# Patient Record
Sex: Female | Born: 1965
Health system: Southern US, Community
[De-identification: ages and names within clinical notes are randomized; demographics above are authoritative.]

## PROBLEM LIST (undated history)

## (undated) DIAGNOSIS — M2550 Pain in unspecified joint: Secondary | ICD-10-CM

## (undated) DIAGNOSIS — F429 Obsessive-compulsive disorder, unspecified: Secondary | ICD-10-CM

## (undated) DIAGNOSIS — T8859XA Other complications of anesthesia, initial encounter: Secondary | ICD-10-CM

## (undated) DIAGNOSIS — M25569 Pain in unspecified knee: Secondary | ICD-10-CM

## (undated) DIAGNOSIS — K760 Fatty (change of) liver, not elsewhere classified: Secondary | ICD-10-CM

## (undated) DIAGNOSIS — R112 Nausea with vomiting, unspecified: Secondary | ICD-10-CM

## (undated) DIAGNOSIS — K5900 Constipation, unspecified: Secondary | ICD-10-CM

## (undated) DIAGNOSIS — T4145XA Adverse effect of unspecified anesthetic, initial encounter: Secondary | ICD-10-CM

## (undated) DIAGNOSIS — G4730 Sleep apnea, unspecified: Secondary | ICD-10-CM

## (undated) DIAGNOSIS — E669 Obesity, unspecified: Secondary | ICD-10-CM

## (undated) DIAGNOSIS — F419 Anxiety disorder, unspecified: Secondary | ICD-10-CM

## (undated) DIAGNOSIS — D649 Anemia, unspecified: Secondary | ICD-10-CM

## (undated) DIAGNOSIS — Z9889 Other specified postprocedural states: Secondary | ICD-10-CM

## (undated) DIAGNOSIS — E119 Type 2 diabetes mellitus without complications: Secondary | ICD-10-CM

## (undated) DIAGNOSIS — M1990 Unspecified osteoarthritis, unspecified site: Secondary | ICD-10-CM

## (undated) DIAGNOSIS — K589 Irritable bowel syndrome without diarrhea: Secondary | ICD-10-CM

## (undated) DIAGNOSIS — K5790 Diverticulosis of intestine, part unspecified, without perforation or abscess without bleeding: Secondary | ICD-10-CM

## (undated) DIAGNOSIS — M549 Dorsalgia, unspecified: Secondary | ICD-10-CM

## (undated) DIAGNOSIS — R0602 Shortness of breath: Secondary | ICD-10-CM

## (undated) DIAGNOSIS — K219 Gastro-esophageal reflux disease without esophagitis: Secondary | ICD-10-CM

## (undated) HISTORY — DX: Pain in unspecified knee: M25.569

## (undated) HISTORY — DX: Obesity, unspecified: E66.9

## (undated) HISTORY — DX: Obsessive-compulsive disorder, unspecified: F42.9

## (undated) HISTORY — PX: KNEE ARTHROSCOPY W/ ACL RECONSTRUCTION: SHX1858

## (undated) HISTORY — DX: Fatty (change of) liver, not elsewhere classified: K76.0

## (undated) HISTORY — DX: Pain in unspecified joint: M25.50

## (undated) HISTORY — DX: Shortness of breath: R06.02

## (undated) HISTORY — PX: BREAST REDUCTION SURGERY: SHX8

## (undated) HISTORY — DX: Constipation, unspecified: K59.00

## (undated) HISTORY — PX: ANAL FISSURE REPAIR: SHX2312

## (undated) HISTORY — DX: Irritable bowel syndrome, unspecified: K58.9

## (undated) HISTORY — DX: Dorsalgia, unspecified: M54.9

## (undated) HISTORY — PX: TONSILLECTOMY AND ADENOIDECTOMY: SHX28

---
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
## 2005-04-23 ENCOUNTER — Other Ambulatory Visit: Admission: RE | Admit: 2005-04-23 | Discharge: 2005-04-23 | Payer: Self-pay | Admitting: Obstetrics and Gynecology

## 2005-07-12 ENCOUNTER — Ambulatory Visit (HOSPITAL_COMMUNITY): Admission: RE | Admit: 2005-07-12 | Discharge: 2005-07-13 | Payer: Self-pay | Admitting: Surgery

## 2008-02-10 ENCOUNTER — Encounter: Admission: RE | Admit: 2008-02-10 | Discharge: 2008-02-10 | Payer: Self-pay | Admitting: Family Medicine

## 2009-02-10 ENCOUNTER — Encounter: Admission: RE | Admit: 2009-02-10 | Discharge: 2009-02-10 | Payer: Self-pay | Admitting: Family Medicine

## 2009-04-29 ENCOUNTER — Emergency Department (HOSPITAL_COMMUNITY): Admission: EM | Admit: 2009-04-29 | Discharge: 2009-04-29 | Payer: Self-pay | Admitting: Emergency Medicine

## 2010-02-12 ENCOUNTER — Encounter: Admission: RE | Admit: 2010-02-12 | Discharge: 2010-02-12 | Payer: Self-pay | Admitting: Family Medicine

## 2010-04-14 ENCOUNTER — Emergency Department (HOSPITAL_COMMUNITY)
Admission: EM | Admit: 2010-04-14 | Discharge: 2010-04-14 | Payer: Self-pay | Source: Home / Self Care | Admitting: Emergency Medicine

## 2010-08-13 LAB — POCT I-STAT, CHEM 8
BUN: 13 mg/dL (ref 6–23)
Calcium, Ion: 1.1 mmol/L — ABNORMAL LOW (ref 1.12–1.32)
Creatinine, Ser: 0.5 mg/dL (ref 0.4–1.2)
TCO2: 25 mmol/L (ref 0–100)

## 2010-08-13 LAB — DIFFERENTIAL
Eosinophils Relative: 1 % (ref 0–5)
Lymphocytes Relative: 22 % (ref 12–46)
Lymphs Abs: 1.8 10*3/uL (ref 0.7–4.0)
Neutro Abs: 5.4 10*3/uL (ref 1.7–7.7)

## 2010-08-13 LAB — URINALYSIS, ROUTINE W REFLEX MICROSCOPIC
Nitrite: NEGATIVE
Specific Gravity, Urine: 1.03 (ref 1.005–1.030)
Urobilinogen, UA: 1 mg/dL (ref 0.0–1.0)

## 2010-08-13 LAB — COMPREHENSIVE METABOLIC PANEL
AST: 18 U/L (ref 0–37)
CO2: 22 mEq/L (ref 19–32)
Calcium: 8.8 mg/dL (ref 8.4–10.5)
Creatinine, Ser: 0.6 mg/dL (ref 0.4–1.2)
GFR calc Af Amer: >60 mL/min (ref >60)
GFR calc non Af Amer: >60 mL/min (ref >60)

## 2010-08-13 LAB — CBC
MCHC: 33.5 g/dL (ref 30.0–36.0)
MCV: 78.7 fL (ref 78.0–100.0)
Platelets: 251 10*3/uL (ref 150–400)
RBC: 5.02 MIL/uL (ref 3.87–5.11)

## 2010-08-13 LAB — URINE MICROSCOPIC-ADD ON

## 2010-08-13 LAB — PREGNANCY, URINE: Preg Test, Ur: NEGATIVE

## 2010-09-28 NOTE — Op Note (Signed)
NAMESHUNTAVIA, YERBY                  ACCOUNT NO.:  [id number]   MEDICAL RECORD NO.:  [id number]          PATIENT TYPE:  OIB   LOCATION:  1305                         FACILITY:  [facility]   PHYSICIAN:  Thomas A. Cornett, M.D.DATE OF BIRTH:  [birth year]   DATE OF PROCEDURE:  07/12/2005  DATE OF DISCHARGE:                                 OPERATIVE REPORT   PREOPERATIVE DIAGNOSIS:  Nonhealing posterior midline anal fissure.   POSTOPERATIVE DIAGNOSIS:  Nonhealing posterior midline anal fissure.   PROCEDURE:  1.  Right lateral internal sphincterotomy.  2.  Rigid proctoscopy.   SURGEON:  Dr. Harriette Bouillon.   ANESTHESIA:  General endotracheal anesthesia with 10 mL of 0.25% Marcaine  with epinephrine.   ESTIMATED BLOOD LOSS:  40 mL.   SPECIMENS:  None.   FINDINGS:  Large posterior midline nonhealing anal fissure with exposed  internal sphincter.   INDICATIONS FOR PROCEDURE:  The patient is a 45 year old female who has seen  Dr. Colin Benton in the past for a nonhealing anal fissure. She was managed  medically but failed and came back to see me subsequently for further  evaluation and consideration for a lateral internal sphincterotomy. After  examining her, it was significant that she a large fissure with exposed  sphincter and at this point in time she had failed diltiazem therapy. I  recommend a lateral internal sphincterotomy after discussion of the  procedure with the patient and the potential long-term consequences and  risks. She agreed to proceed.   DESCRIPTION OF PROCEDURE:  The patient was brought to the operating room and  placed supine. After induction of general endotracheal anesthesia, she was  placed in stirrups in lithotomy position. Her perineum was prepped and  draped in a sterile fashion and tape was used to pull her buttocks apart.  Digital rectal examination was done initially. Her tone was normal. A rigid  proctoscope was then passed to 10 cm and her distal rectum was  examined with  mild insufflation. I saw no other abnormality except for some grade II to  grade III internal hemorrhoids in all three columns and a very large  nonhealing posterior anal fissure. The scope was withdrawn and the air was  released. Next a smaller proctoscope was placed and the right lateral  position was palpated. I could feel the intersphincteric groove between the  internal and external sphincter. An incision was made in the right lateral  position from just below the dentate line down to the anoderm. I used  cautery to separate the skin with a hemostat. She had significant internal  hemorrhoidal disease as well. Next, I was able to clear off the internal  sphincter and identify it. I was able to palpate the intersphincteric groove  and the sphincter was hypertrophied. A hemostat was placed in the  intersphincteric groove under direct vision thus exposing the internal  sphincter. Cautery was used to divide the fibers of the internal sphincter  for approximately 2 cm right up to the dentate line. Hemostasis was achieved  with cautery and I placed a few small Monocryl 4-0 sutures  for hemostasis  over the mucosa at this point. The end of the incision was left open to  drain. Next I reexamined the posterior anal fissure. A couple sutures of  Monocryl were used to cover part of the sphincter that was exposed with  mucosa. This was also helpful for some of the oozing from the area due to  the manipulation from the proctoscope. Gelfoam was then placed at this point  in time. All counts of sponge, needle and instruments were found to be  correct at this portion of the case. The patient was awoke and taken to  recovery in satisfactory condition.      Thomas A. Cornett, M.D.  Electronically Signed     TAC/MEDQ  D:  07/12/2005  T:  07/13/2005  Job:  [id number]   cc:   Carrington Clamp, M.D.  Fax: [phone]

## 2011-01-10 ENCOUNTER — Other Ambulatory Visit: Payer: Self-pay | Admitting: Family Medicine

## 2011-01-10 DIAGNOSIS — Z1231 Encounter for screening mammogram for malignant neoplasm of breast: Secondary | ICD-10-CM

## 2011-02-28 ENCOUNTER — Ambulatory Visit
Admission: RE | Admit: 2011-02-28 | Discharge: 2011-02-28 | Disposition: A | Discharge status: Home / Self Care | Payer: BC Managed Care – PPO | Source: Ambulatory Visit | Attending: Family Medicine | Admitting: Family Medicine

## 2011-02-28 DIAGNOSIS — Z1231 Encounter for screening mammogram for malignant neoplasm of breast: Secondary | ICD-10-CM

## 2012-01-30 ENCOUNTER — Other Ambulatory Visit: Payer: Self-pay | Admitting: Family Medicine

## 2012-01-30 DIAGNOSIS — Z1231 Encounter for screening mammogram for malignant neoplasm of breast: Secondary | ICD-10-CM

## 2012-03-03 ENCOUNTER — Ambulatory Visit: Payer: BC Managed Care – PPO

## 2012-03-11 ENCOUNTER — Ambulatory Visit
Admission: RE | Admit: 2012-03-11 | Discharge: 2012-03-11 | Disposition: A | Discharge status: Home / Self Care | Payer: BC Managed Care – PPO | Source: Ambulatory Visit | Attending: Family Medicine | Admitting: Family Medicine

## 2012-03-11 DIAGNOSIS — Z1231 Encounter for screening mammogram for malignant neoplasm of breast: Secondary | ICD-10-CM

## 2013-02-04 ENCOUNTER — Other Ambulatory Visit: Payer: Self-pay

## 2013-02-04 DIAGNOSIS — Z1231 Encounter for screening mammogram for malignant neoplasm of breast: Secondary | ICD-10-CM

## 2013-03-12 ENCOUNTER — Ambulatory Visit
Admission: RE | Admit: 2013-03-12 | Discharge: 2013-03-12 | Disposition: A | Discharge status: Home / Self Care | Payer: BC Managed Care – PPO | Source: Ambulatory Visit

## 2013-03-12 DIAGNOSIS — Z1231 Encounter for screening mammogram for malignant neoplasm of breast: Secondary | ICD-10-CM

## 2014-02-07 ENCOUNTER — Other Ambulatory Visit: Payer: Self-pay

## 2014-02-07 DIAGNOSIS — Z1231 Encounter for screening mammogram for malignant neoplasm of breast: Secondary | ICD-10-CM

## 2014-03-14 ENCOUNTER — Ambulatory Visit
Admission: RE | Admit: 2014-03-14 | Discharge: 2014-03-14 | Disposition: A | Discharge status: Home / Self Care | Payer: BC Managed Care – PPO | Source: Ambulatory Visit

## 2014-03-14 DIAGNOSIS — Z1231 Encounter for screening mammogram for malignant neoplasm of breast: Secondary | ICD-10-CM

## 2014-10-06 ENCOUNTER — Other Ambulatory Visit: Payer: Self-pay | Admitting: Gastroenterology

## 2014-11-08 ENCOUNTER — Encounter (HOSPITAL_COMMUNITY): Payer: Self-pay | Admitting: *Deleted

## 2014-11-15 ENCOUNTER — Ambulatory Visit (HOSPITAL_COMMUNITY): Payer: BLUE CROSS/BLUE SHIELD | Admitting: Anesthesiology

## 2014-11-16 NOTE — Anesthesia Preprocedure Evaluation (Addendum)
Anesthesia Evaluation    Reviewed: Allergy & Precautions, NPO status , Patient's Chart, lab work & pertinent test results, Unable to perform ROS - Chart review only  History of Anesthesia Complications (+) PONV and history of anesthetic complications (low BP with spinal anesthesia)  Airway        Dental   Pulmonary sleep apnea and Continuous Positive Airway Pressure Ventilation ,          Cardiovascular negative cardio ROS      Neuro/Psych Anxiety    GI/Hepatic GERD-  Medicated,  Endo/Other  diabetes, Type 2, Oral Hypoglycemic Agents  Renal/GU      Musculoskeletal   Abdominal   Peds  Hematology  (+) anemia ,   Anesthesia Other Findings   Reproductive/Obstetrics                           Anesthesia Physical Anesthesia Plan  ASA: III  Anesthesia Plan: MAC   Post-op Pain Management:    Induction:   Airway Management Planned: Natural Airway  Additional Equipment:   Intra-op Plan:   Post-operative Plan:   Informed Consent: I have reviewed the patients History and Physical, chart, labs and discussed the procedure including the risks, benefits and alternatives for the proposed anesthesia with the patient or authorized representative who has indicated his/her understanding and acceptance.   Dental advisory given  Plan Discussed with: CRNA  Anesthesia Plan Comments: (Case canceled due to inadequate prep)      Anesthesia Quick Evaluation

## 2014-11-17 ENCOUNTER — Encounter (HOSPITAL_COMMUNITY)
Admission: RE | Disposition: A | Discharge status: Home / Self Care | Payer: Self-pay | Source: Ambulatory Visit | Attending: Gastroenterology

## 2014-11-17 ENCOUNTER — Encounter (HOSPITAL_COMMUNITY): Payer: Self-pay | Admitting: Certified Registered"

## 2014-11-17 ENCOUNTER — Ambulatory Visit (HOSPITAL_COMMUNITY)
Admission: RE | Admit: 2014-11-17 | Discharge: 2014-11-17 | Disposition: A | Discharge status: Home / Self Care | Payer: BLUE CROSS/BLUE SHIELD | Source: Ambulatory Visit | Attending: Gastroenterology | Admitting: Gastroenterology

## 2014-11-17 DIAGNOSIS — G4730 Sleep apnea, unspecified: Secondary | ICD-10-CM | POA: Diagnosis not present

## 2014-11-17 DIAGNOSIS — E119 Type 2 diabetes mellitus without complications: Secondary | ICD-10-CM | POA: Insufficient documentation

## 2014-11-17 DIAGNOSIS — K219 Gastro-esophageal reflux disease without esophagitis: Secondary | ICD-10-CM | POA: Diagnosis not present

## 2014-11-17 DIAGNOSIS — F419 Anxiety disorder, unspecified: Secondary | ICD-10-CM | POA: Insufficient documentation

## 2014-11-17 DIAGNOSIS — Z79899 Other long term (current) drug therapy: Secondary | ICD-10-CM | POA: Diagnosis not present

## 2014-11-17 DIAGNOSIS — Z538 Procedure and treatment not carried out for other reasons: Secondary | ICD-10-CM | POA: Diagnosis not present

## 2014-11-17 DIAGNOSIS — R1031 Right lower quadrant pain: Secondary | ICD-10-CM | POA: Diagnosis not present

## 2014-11-17 DIAGNOSIS — D649 Anemia, unspecified: Secondary | ICD-10-CM | POA: Insufficient documentation

## 2014-11-17 HISTORY — DX: Other specified postprocedural states: Z98.890

## 2014-11-17 HISTORY — DX: Anxiety disorder, unspecified: F41.9

## 2014-11-17 HISTORY — DX: Nausea with vomiting, unspecified: R11.2

## 2014-11-17 HISTORY — DX: Gastro-esophageal reflux disease without esophagitis: K21.9

## 2014-11-17 HISTORY — DX: Anemia, unspecified: D64.9

## 2014-11-17 HISTORY — DX: Adverse effect of unspecified anesthetic, initial encounter: T41.45XA

## 2014-11-17 HISTORY — DX: Other complications of anesthesia, initial encounter: T88.59XA

## 2014-11-17 HISTORY — DX: Type 2 diabetes mellitus without complications: E11.9

## 2014-11-17 HISTORY — DX: Unspecified osteoarthritis, unspecified site: M19.90

## 2014-11-17 HISTORY — DX: Sleep apnea, unspecified: G47.30

## 2014-11-17 SURGERY — CANCELLED PROCEDURE
Anesthesia: Monitor Anesthesia Care

## 2014-11-17 MED ORDER — PROPOFOL 10 MG/ML IV BOLUS
INTRAVENOUS | Status: AC
  Filled 2014-11-17: qty 20

## 2014-11-17 MED ORDER — SODIUM CHLORIDE 0.9 % IV SOLN: INTRAVENOUS | Status: DC

## 2014-11-17 MED ORDER — ONDANSETRON HCL 4 MG/2ML IJ SOLN: 4.0000 mg | Freq: Once | INTRAMUSCULAR | Status: AC | PRN

## 2014-11-17 MED ORDER — LIDOCAINE HCL (CARDIAC) 20 MG/ML IV SOLN
INTRAVENOUS | Status: AC
  Filled 2014-11-17: qty 5

## 2014-12-09 ENCOUNTER — Encounter (HOSPITAL_COMMUNITY): Payer: Self-pay

## 2014-12-09 NOTE — OR Nursing (Signed)
Pt. Unable to tolerate prep.  Dr. Collene Mares notified and patient was discharged with instruction to reschedle procedure.    Laverta Baltimore, RN

## 2015-01-23 ENCOUNTER — Encounter (HOSPITAL_COMMUNITY): Payer: Self-pay | Admitting: *Deleted

## 2015-02-16 ENCOUNTER — Encounter (HOSPITAL_COMMUNITY): Payer: Self-pay | Admitting: *Deleted

## 2015-02-20 ENCOUNTER — Other Ambulatory Visit: Payer: Self-pay

## 2015-02-20 DIAGNOSIS — Z1231 Encounter for screening mammogram for malignant neoplasm of breast: Secondary | ICD-10-CM

## 2015-02-22 ENCOUNTER — Encounter (HOSPITAL_COMMUNITY): Payer: Self-pay | Admitting: Anesthesiology

## 2015-02-22 ENCOUNTER — Other Ambulatory Visit: Payer: Self-pay | Admitting: Gastroenterology

## 2015-02-22 NOTE — Anesthesia Preprocedure Evaluation (Signed)
Anesthesia Evaluation  Patient identified by MRN, date of birth, ID band Patient awake    Reviewed: Allergy & Precautions, NPO status , Patient's Chart, lab work & pertinent test results  History of Anesthesia Complications (+) PONV and history of anesthetic complications  Airway Mallampati: II  TM Distance: >3 FB Neck ROM: Full    Dental no notable dental hx.    Pulmonary sleep apnea and Continuous Positive Airway Pressure Ventilation ,    Pulmonary exam normal breath sounds clear to auscultation       Cardiovascular negative cardio ROS Normal cardiovascular exam Rhythm:Regular Rate:Normal     Neuro/Psych Anxiety negative neurological ROS     GI/Hepatic Neg liver ROS, GERD  Medicated,  Endo/Other  diabetes, Type 2, Oral Hypoglycemic AgentsMorbid obesity  Renal/GU negative Renal ROS  negative genitourinary   Musculoskeletal  (+) Arthritis ,   Abdominal (+) + obese,   Peds negative pediatric ROS (+)  Hematology  (+) anemia ,   Anesthesia Other Findings   Reproductive/Obstetrics negative OB ROS                             Anesthesia Physical Anesthesia Plan  ASA: III  Anesthesia Plan: MAC   Post-op Pain Management:    Induction: Intravenous  Airway Management Planned: Natural Airway  Additional Equipment:   Intra-op Plan:   Post-operative Plan:   Informed Consent: I have reviewed the patients History and Physical, chart, labs and discussed the procedure including the risks, benefits and alternatives for the proposed anesthesia with the patient or authorized representative who has indicated his/her understanding and acceptance.   Dental advisory given  Plan Discussed with: CRNA  Anesthesia Plan Comments:         Anesthesia Quick Evaluation

## 2015-02-23 ENCOUNTER — Ambulatory Visit (HOSPITAL_COMMUNITY)
Admission: RE | Admit: 2015-02-23 | Discharge: 2015-02-23 | Disposition: A | Discharge status: Home / Self Care | Payer: BLUE CROSS/BLUE SHIELD | Source: Ambulatory Visit | Attending: Gastroenterology | Admitting: Gastroenterology

## 2015-02-23 ENCOUNTER — Ambulatory Visit (HOSPITAL_COMMUNITY): Payer: BLUE CROSS/BLUE SHIELD | Admitting: Anesthesiology

## 2015-02-23 ENCOUNTER — Encounter (HOSPITAL_COMMUNITY): Payer: Self-pay | Admitting: *Deleted

## 2015-02-23 ENCOUNTER — Encounter (HOSPITAL_COMMUNITY)
Admission: RE | Disposition: A | Discharge status: Home / Self Care | Payer: Self-pay | Source: Ambulatory Visit | Attending: Gastroenterology

## 2015-02-23 DIAGNOSIS — K621 Rectal polyp: Secondary | ICD-10-CM | POA: Diagnosis not present

## 2015-02-23 DIAGNOSIS — E119 Type 2 diabetes mellitus without complications: Secondary | ICD-10-CM | POA: Diagnosis not present

## 2015-02-23 DIAGNOSIS — Z791 Long term (current) use of non-steroidal anti-inflammatories (NSAID): Secondary | ICD-10-CM | POA: Diagnosis not present

## 2015-02-23 DIAGNOSIS — M1990 Unspecified osteoarthritis, unspecified site: Secondary | ICD-10-CM | POA: Insufficient documentation

## 2015-02-23 DIAGNOSIS — G4730 Sleep apnea, unspecified: Secondary | ICD-10-CM | POA: Diagnosis not present

## 2015-02-23 DIAGNOSIS — Z79899 Other long term (current) drug therapy: Secondary | ICD-10-CM | POA: Diagnosis not present

## 2015-02-23 DIAGNOSIS — Z1211 Encounter for screening for malignant neoplasm of colon: Secondary | ICD-10-CM | POA: Insufficient documentation

## 2015-02-23 DIAGNOSIS — Z6841 Body Mass Index (BMI) 40.0 and over, adult: Secondary | ICD-10-CM | POA: Insufficient documentation

## 2015-02-23 DIAGNOSIS — Z7984 Long term (current) use of oral hypoglycemic drugs: Secondary | ICD-10-CM | POA: Insufficient documentation

## 2015-02-23 DIAGNOSIS — K219 Gastro-esophageal reflux disease without esophagitis: Secondary | ICD-10-CM | POA: Diagnosis not present

## 2015-02-23 DIAGNOSIS — K635 Polyp of colon: Secondary | ICD-10-CM | POA: Insufficient documentation

## 2015-02-23 HISTORY — PX: COLONOSCOPY WITH PROPOFOL: SHX5780

## 2015-02-23 HISTORY — DX: Diverticulosis of intestine, part unspecified, without perforation or abscess without bleeding: K57.90

## 2015-02-23 LAB — GLUCOSE, CAPILLARY: Glucose-Capillary: 137 mg/dL — ABNORMAL HIGH (ref 65–99)

## 2015-02-23 SURGERY — COLONOSCOPY WITH PROPOFOL
Anesthesia: Monitor Anesthesia Care

## 2015-02-23 MED ORDER — PROPOFOL 10 MG/ML IV BOLUS
INTRAVENOUS | Status: AC
  Filled 2015-02-23: qty 20

## 2015-02-23 MED ORDER — PROPOFOL 10 MG/ML IV BOLUS
INTRAVENOUS | Status: DC | PRN
  Administered 2015-02-23: 30 mg via INTRAVENOUS
  Administered 2015-02-23 (×2): 20 mg via INTRAVENOUS
  Administered 2015-02-23: 30 mg via INTRAVENOUS
  Administered 2015-02-23 (×3): 20 mg via INTRAVENOUS

## 2015-02-23 MED ORDER — SODIUM CHLORIDE 0.9 % IV SOLN: INTRAVENOUS | Status: DC

## 2015-02-23 MED ORDER — PROPOFOL 10 MG/ML IV BOLUS
INTRAVENOUS | Status: AC
Start: 2015-02-23 — End: 2015-02-23
  Filled 2015-02-23: qty 20

## 2015-02-23 MED ORDER — LACTATED RINGERS IV SOLN
INTRAVENOUS | Status: DC
  Administered 2015-02-23: 1000 mL via INTRAVENOUS

## 2015-02-23 MED ORDER — PROPOFOL 500 MG/50ML IV EMUL
INTRAVENOUS | Status: DC | PRN
  Administered 2015-02-23: 150 ug/kg/min via INTRAVENOUS

## 2015-02-23 SURGICAL SUPPLY — 22 items

## 2015-02-23 NOTE — Discharge Instructions (Signed)

## 2015-02-23 NOTE — Anesthesia Postprocedure Evaluation (Signed)
  Anesthesia Post-op Note  Patient: Karen [name]  Procedure(s) Performed: Procedure(s) (LRB): COLONOSCOPY WITH PROPOFOL (N/A)  Patient Location: PACU  Anesthesia Type: MAC  Level of Consciousness: awake and alert   Airway and Oxygen Therapy: Patient Spontanous Breathing  Post-op Pain: mild  Post-op Assessment: Post-op Vital signs reviewed, Patient's Cardiovascular Status Stable, Respiratory Function Stable, Patent Airway and No signs of Nausea or vomiting  Last Vitals:  Filed Vitals:   02/23/15 0820  BP: 119/78  Pulse: 76  Temp:   Resp: 17    Post-op Vital Signs: stable   Complications: No apparent anesthesia complications

## 2015-02-23 NOTE — Op Note (Signed)
[facility] [place], [address]   OPERATIVE PROCEDURE REPORT  PATIENT: Karen, [name]  MR#: [id number] BIRTHDATE: 1965-06-13 GENDER: female ENDOSCOPIST: Edmonia James, MD ASSISTANT:   Tory Emerald, RN 7 Cristopher Estimable, Merchant [employer] officer. PROCEDURE DATE: 02/23/2015 PRE-PROCEDURE PREPARATION: Patient fasted for 4 hours prior to procedure. The patient was prepped with a gallon of Golytely the night prior to the procedure.  PRE-PROCEDURE PHYSICAL: Patient has stable vital signs.  Neck is supple.  There is no JVD, thyromegaly or LAD.  Chest clear to auscultation.  S1 and S2 regular.  Abdomen soft, non-distended, non-tender with NABS. PROCEDURE:     Colonoscopy with cold biopsies x 5 and hot snare polypectomy x 3. ASA CLASS:     Class III INDICATIONS:     1.  Colorectal cancer screening.2. Iron deficiency anemia. MEDICATIONS:     Monitored anesthesia care  DESCRIPTION OF PROCEDURE: After the risks, benefits, and alternatives of the procedure were thoroughly explained [including a 10% missed rate of cancer and polyps], informed consent was obtained.  Digital rectal exam was performed. The EC-3890Li ([id number])  was introduced through the anus  and advanced to the terminal ileum which was intubated for a short distance , limited by No adverse events experienced.  The quality of the prep was good. Multiple washes were done. Small lesions could be missed. The instrument was then slowly withdrawn as the colon was fully examined. Estimated blood loss is zero unless otherwise noted in this procedure report.     COLON FINDINGS: Three dimunitive polyps were found in the rectum and were removed by cold biopsies x 3. Two sessile polyps measuring 7 mm and 9 mm respectively were found in the distal sigmoid colon; these were removed by hot snare polypectomy x 2. The resection was complete, the polyp tissue was completely retrieved and sent to histology. Two dimunitive  polyps were found in the distal sigmoid colon removed by cold biopsies x 2. A 7 mm sessile polyp was found in the distal transverse colon and was removed by hot snare x 1-200/20. The rest of the colonic mucosa appeared healthy with a normal vascular pattern. No masses, diverticula or AVMs were noted. The appendiceal orifice and the ICV were identified and photographed. The terminal ileum appeared normal.  Retroflexed views revealed no abnormalities. The patient tolerated the procedure without immediate complications.  The scope was then withdrawn from the patient and the procedure terminated.  TIME TO CECUM:   05 minutes 00 seconds WITHDRAW TIME:  19 minutes 00 seconds  IMPRESSION:     1.  Three dimunitive polyps were found in the rectum-removed by cold biopsies x 3. 2.  Two polyps, 7 mm and 9 mm sessile polyps were found in the distal sigmoid colon-removed by snare polypectomy. 3.  Two dimunitive polyps were found in the distal sigmoid colon-removed by cold biopsies x 2. 4.  One 7 mm sessile polyp was found in the distal transverse colon-removed by hot snare x 1.  RECOMMENDATIONS:     1.  Hold Aspirin and all other NSAIDS for 2 weeks. 2.  Await pathology results. 3.  Continue current medications. 4.  Continue surveillance. 5.  High fiber diet with liberal fluid intake. 6.  Out patient follow-up in 2 weeks.  REPEAT EXAM:      In 3-5 years  for a repeat colonoscopy.  If the patient has any abnormal GI symptoms in the interim, she/he have been advised to contact the office as  soon as possible for further recommendations.   REFERRED FV:OHKGOV Anderson, F.N.P.-B.C. eSignedEdmonia James, MD Mar 17, 2015 8:24 AM  CPT CODES:     [phone] Colonoscopy with snare polypectomy [phone] Colonoscopy, flexible, proximal to splenic flexure; with biopsy, single or multiple ICD CODES:     Z12.11 Encounter for screening for malignant neoplasm of colon, D50.9, Iron deficiency anemia D12.5 Benign  neoplasm of sigmoid colon D12.3 Benign neoplasm of transverse colon  The ICD and CPT codes recommended by this software are interpretations from the data that the clinical staff has captured with the software.  The verification of the translation of this report to the ICD and CPT codes and modifiers is the sole responsibility of the health care institution and practicing physician where this report was generated.  [employer] will not be held responsible for the validity of the ICD and CPT codes included on this report.  AMA assumes no liability for data contained or not contained herein. CPT is a Designer, television/film set of the [employer].  PATIENT NAME:  Karen, [name] MR#: [id number]

## 2015-02-23 NOTE — H&P (Signed)
Karen [name] is an 49 y.o. female.   Chief Complaint: Colorectal cancer screening. HPI: Patient is here for a screening colonoscopy. She is being done in the hospital as her BMI is over 50 and she has sleep apnea. See office notes for details.  Past Medical History  Diagnosis Date  . Complication of anesthesia   . PONV (postoperative nausea and vomiting)     drop in blood pressure(spinal anesthesia)  . Diabetes mellitus without complication ([place])   . Sleep apnea     cpap used "automatic"  . Anxiety   . GERD (gastroesophageal reflux disease)   . Arthritis     general -coccyx area, elbow  . Anemia     iron deficiency  . Diverticulosis    Past Surgical History  Procedure Laterality Date  . Cesarean section      x2  . Breast reduction surgery      '88  . Tonsillectomy and adenoidectomy      "Adenoids only"  . Knee arthroscopy w/ acl reconstruction Right     remains with a ? tear.  Orpah Cobb fissure repair      '07   History reviewed. No pertinent family history. Social History:  reports that she has never smoked. She does not have any smokeless tobacco history on file. She reports that she does not drink alcohol or use illicit drugs.  Allergies:  Allergies  Allergen Reactions  . Sulfa Antibiotics Nausea And Vomiting and Rash    Medications Prior to Admission  Medication Sig Dispense Refill  . dexlansoprazole (DEXILANT) 60 MG capsule Take 1 capsule by mouth daily.    Marland Kitchen escitalopram (LEXAPRO) 20 MG tablet Take 20 mg by mouth daily.    . ferrous sulfate 325 (65 FE) MG EC tablet Take 325 mg by mouth daily.    Marland Kitchen ibuprofen (ADVIL,MOTRIN) 200 MG tablet Take 400 mg by mouth every 6 (six) hours as needed for moderate pain.    . Liraglutide (VICTOZA) 18 MG/3ML SOPN Inject 1.8 mg into the skin daily.    . metFORMIN (GLUCOPHAGE) 1000 MG tablet Take 1,000 mg by mouth 2 (two) times daily.    . Probiotic Product (PROBIOTIC DAILY PO) Take 1 capsule by mouth daily.     Review of Systems   Constitutional: Negative.   HENT: Negative.   Eyes: Negative.   Respiratory: Negative.   Cardiovascular: Negative.   Gastrointestinal: Positive for heartburn and constipation. Negative for nausea, vomiting, abdominal pain, diarrhea, blood in stool and melena.  Genitourinary: Negative.   Skin: Negative.   Neurological: Negative.   Endo/Heme/Allergies: Negative.   Psychiatric/Behavioral: Positive for depression. Negative for suicidal ideas, hallucinations, memory loss and substance abuse. The patient is not nervous/anxious and does not have insomnia.    Last menstrual period 02/06/2015. Physical Exam  Constitutional: She is oriented to person, place, and time. She appears well-developed and well-nourished.  Morbidly obese  HENT:  Head: Normocephalic and atraumatic.  Eyes: Conjunctivae and EOM are normal. Pupils are equal, round, and reactive to light.  Neck: Normal range of motion. Neck supple.  Cardiovascular: Normal rate and regular rhythm.   Respiratory: Effort normal and breath sounds normal.  GI: Soft. Bowel sounds are normal.  Musculoskeletal: Normal range of motion.  Neurological: She is alert and oriented to person, place, and time.    Assessment/Plan Colorectal cancer screening: proceed with a colonoscopy at this time.  Karen [name] 02/23/2015, 7:08 AM

## 2015-02-23 NOTE — Transfer of Care (Signed)
Immediate Anesthesia Transfer of Care Note  Patient: Karen [name]  Procedure(s) Performed: Procedure(s): COLONOSCOPY WITH PROPOFOL (N/A)  Patient Location: PACU and Endoscopy Unit  Anesthesia Type:MAC  Level of Consciousness: awake, alert  and patient cooperative  Airway & Oxygen Therapy: Patient Spontanous Breathing and Patient connected to face mask oxygen  Post-op Assessment: Report given to RN, Post -op Vital signs reviewed and stable and Patient moving all extremities X 4  Post vital signs: Reviewed and stable  Last Vitals:  Filed Vitals:   02/23/15 0701  BP: 116/83  Pulse: 81  Temp: 36.7 C  Resp: 17    Complications: No apparent anesthesia complications

## 2015-02-23 NOTE — Anesthesia Procedure Notes (Signed)
Date/Time: 02/23/2015 7:34 AM Performed by: Lollie Sails Oxygen Delivery Method: Simple face mask

## 2015-02-24 ENCOUNTER — Encounter (HOSPITAL_COMMUNITY): Payer: Self-pay | Admitting: Gastroenterology

## 2015-03-22 ENCOUNTER — Ambulatory Visit
Admission: RE | Admit: 2015-03-22 | Discharge: 2015-03-22 | Disposition: A | Discharge status: Home / Self Care | Payer: BLUE CROSS/BLUE SHIELD | Source: Ambulatory Visit

## 2015-03-22 DIAGNOSIS — Z1231 Encounter for screening mammogram for malignant neoplasm of breast: Secondary | ICD-10-CM

## 2016-02-05 ENCOUNTER — Other Ambulatory Visit: Payer: Self-pay | Admitting: Nurse Practitioner

## 2016-02-05 DIAGNOSIS — Z1231 Encounter for screening mammogram for malignant neoplasm of breast: Secondary | ICD-10-CM

## 2016-03-22 ENCOUNTER — Ambulatory Visit
Admission: RE | Admit: 2016-03-22 | Discharge: 2016-03-22 | Disposition: A | Discharge status: Home / Self Care | Payer: BLUE CROSS/BLUE SHIELD | Source: Ambulatory Visit | Attending: Nurse Practitioner | Admitting: Nurse Practitioner

## 2016-03-22 DIAGNOSIS — Z1231 Encounter for screening mammogram for malignant neoplasm of breast: Secondary | ICD-10-CM

## 2017-02-10 ENCOUNTER — Other Ambulatory Visit: Payer: Self-pay | Admitting: Pediatrics

## 2017-02-10 ENCOUNTER — Other Ambulatory Visit: Payer: Self-pay | Admitting: Nurse Practitioner

## 2017-02-10 DIAGNOSIS — Z1231 Encounter for screening mammogram for malignant neoplasm of breast: Secondary | ICD-10-CM

## 2017-03-25 ENCOUNTER — Ambulatory Visit
Admission: RE | Admit: 2017-03-25 | Discharge: 2017-03-25 | Disposition: A | Discharge status: Home / Self Care | Payer: BLUE CROSS/BLUE SHIELD | Source: Ambulatory Visit | Attending: Nurse Practitioner | Admitting: Nurse Practitioner

## 2017-03-25 DIAGNOSIS — Z1231 Encounter for screening mammogram for malignant neoplasm of breast: Secondary | ICD-10-CM

## 2017-05-27 ENCOUNTER — Other Ambulatory Visit: Payer: Self-pay | Admitting: Gastroenterology

## 2017-06-16 ENCOUNTER — Encounter (HOSPITAL_COMMUNITY): Payer: Self-pay | Admitting: Emergency Medicine

## 2017-06-16 ENCOUNTER — Other Ambulatory Visit: Payer: Self-pay

## 2017-07-02 NOTE — Anesthesia Preprocedure Evaluation (Addendum)
Anesthesia Evaluation  Patient identified by MRN, date of birth, ID band Patient awake    Reviewed: Allergy & Precautions, H&P , NPO status , Patient's Chart, lab work & pertinent test results  History of Anesthesia Complications (+) PONV  Airway Mallampati: II  TM Distance: >3 FB Neck ROM: Full    Dental no notable dental hx. (+) Teeth Intact, Dental Advisory Given   Pulmonary sleep apnea and Continuous Positive Airway Pressure Ventilation ,    Pulmonary exam normal breath sounds clear to auscultation       Cardiovascular Exercise Tolerance: Good negative cardio ROS   Rhythm:Regular Rate:Normal     Neuro/Psych Anxiety negative neurological ROS  negative psych ROS   GI/Hepatic Neg liver ROS, GERD  Medicated,  Endo/Other  diabetes, Type 2, Oral Hypoglycemic AgentsMorbid obesity  Renal/GU negative Renal ROS  negative genitourinary   Musculoskeletal  (+) Arthritis , Osteoarthritis,    Abdominal   Peds  Hematology negative hematology ROS (+) anemia ,   Anesthesia Other Findings   Reproductive/Obstetrics negative OB ROS                            Anesthesia Physical Anesthesia Plan  ASA: III  Anesthesia Plan: MAC   Post-op Pain Management:    Induction: Intravenous  PONV Risk Score and Plan: 2 and Propofol infusion and Treatment may vary due to age or medical condition  Airway Management Planned: Nasal Cannula  Additional Equipment:   Intra-op Plan:   Post-operative Plan:   Informed Consent: I have reviewed the patients History and Physical, chart, labs and discussed the procedure including the risks, benefits and alternatives for the proposed anesthesia with the patient or authorized representative who has indicated his/her understanding and acceptance.   Dental advisory given  Plan Discussed with: CRNA  Anesthesia Plan Comments:         Anesthesia Quick  Evaluation

## 2017-07-03 ENCOUNTER — Ambulatory Visit (HOSPITAL_COMMUNITY): Payer: BLUE CROSS/BLUE SHIELD | Admitting: Anesthesiology

## 2017-07-03 ENCOUNTER — Ambulatory Visit (HOSPITAL_COMMUNITY)
Admission: RE | Admit: 2017-07-03 | Discharge: 2017-07-03 | Disposition: A | Discharge status: Home / Self Care | Payer: BLUE CROSS/BLUE SHIELD | Source: Ambulatory Visit | Attending: Gastroenterology | Admitting: Gastroenterology

## 2017-07-03 ENCOUNTER — Encounter (HOSPITAL_COMMUNITY): Payer: Self-pay

## 2017-07-03 ENCOUNTER — Other Ambulatory Visit: Payer: Self-pay

## 2017-07-03 ENCOUNTER — Encounter (HOSPITAL_COMMUNITY)
Admission: RE | Disposition: A | Discharge status: Home / Self Care | Payer: Self-pay | Source: Ambulatory Visit | Attending: Gastroenterology

## 2017-07-03 DIAGNOSIS — E119 Type 2 diabetes mellitus without complications: Secondary | ICD-10-CM | POA: Insufficient documentation

## 2017-07-03 DIAGNOSIS — R110 Nausea: Secondary | ICD-10-CM | POA: Diagnosis not present

## 2017-07-03 DIAGNOSIS — Z9989 Dependence on other enabling machines and devices: Secondary | ICD-10-CM | POA: Insufficient documentation

## 2017-07-03 DIAGNOSIS — D509 Iron deficiency anemia, unspecified: Secondary | ICD-10-CM | POA: Insufficient documentation

## 2017-07-03 DIAGNOSIS — Z79899 Other long term (current) drug therapy: Secondary | ICD-10-CM | POA: Insufficient documentation

## 2017-07-03 DIAGNOSIS — F419 Anxiety disorder, unspecified: Secondary | ICD-10-CM | POA: Insufficient documentation

## 2017-07-03 DIAGNOSIS — Z7984 Long term (current) use of oral hypoglycemic drugs: Secondary | ICD-10-CM | POA: Insufficient documentation

## 2017-07-03 DIAGNOSIS — Z6841 Body Mass Index (BMI) 40.0 and over, adult: Secondary | ICD-10-CM | POA: Diagnosis not present

## 2017-07-03 DIAGNOSIS — K219 Gastro-esophageal reflux disease without esophagitis: Secondary | ICD-10-CM | POA: Insufficient documentation

## 2017-07-03 DIAGNOSIS — G4730 Sleep apnea, unspecified: Secondary | ICD-10-CM | POA: Diagnosis not present

## 2017-07-03 DIAGNOSIS — Z882 Allergy status to sulfonamides status: Secondary | ICD-10-CM | POA: Diagnosis not present

## 2017-07-03 DIAGNOSIS — R1013 Epigastric pain: Secondary | ICD-10-CM | POA: Insufficient documentation

## 2017-07-03 HISTORY — PX: ESOPHAGOGASTRODUODENOSCOPY (EGD) WITH PROPOFOL: SHX5813

## 2017-07-03 LAB — GLUCOSE, CAPILLARY: Glucose-Capillary: 135 mg/dL — ABNORMAL HIGH (ref 65–99)

## 2017-07-03 SURGERY — ESOPHAGOGASTRODUODENOSCOPY (EGD) WITH PROPOFOL
Anesthesia: Monitor Anesthesia Care

## 2017-07-03 MED ORDER — LIDOCAINE 2% (20 MG/ML) 5 ML SYRINGE
INTRAMUSCULAR | Status: DC | PRN
  Administered 2017-07-03: 100 mg via INTRAVENOUS

## 2017-07-03 MED ORDER — PROPOFOL 10 MG/ML IV BOLUS
INTRAVENOUS | Status: DC | PRN
  Administered 2017-07-03 (×2): 20 mg via INTRAVENOUS

## 2017-07-03 MED ORDER — LACTATED RINGERS IV SOLN
INTRAVENOUS | Status: DC
  Administered 2017-07-03: 1000 mL via INTRAVENOUS

## 2017-07-03 MED ORDER — PROPOFOL 500 MG/50ML IV EMUL
INTRAVENOUS | Status: DC | PRN
  Administered 2017-07-03: 200 ug/kg/min via INTRAVENOUS

## 2017-07-03 MED ORDER — PROPOFOL 10 MG/ML IV BOLUS
INTRAVENOUS | Status: AC
  Filled 2017-07-03: qty 40

## 2017-07-03 MED ORDER — ONDANSETRON HCL 4 MG/2ML IJ SOLN
INTRAMUSCULAR | Status: DC | PRN
  Administered 2017-07-03: 4 mg via INTRAVENOUS

## 2017-07-03 MED ORDER — SODIUM CHLORIDE 0.9 % IV SOLN: INTRAVENOUS | Status: DC

## 2017-07-03 SURGICAL SUPPLY — 15 items

## 2017-07-03 NOTE — Anesthesia Procedure Notes (Signed)
Date/Time: 07/03/2017 7:25 AM Performed by: Maxwell Caul, CRNA Oxygen Delivery Method: Nasal cannula

## 2017-07-03 NOTE — Anesthesia Postprocedure Evaluation (Signed)
Anesthesia Post Note  Patient: Karen [name]  Procedure(s) Performed: ESOPHAGOGASTRODUODENOSCOPY (EGD) WITH PROPOFOL (N/A )     Patient location during evaluation: PACU Anesthesia Type: MAC Level of consciousness: awake and alert Pain management: pain level controlled Vital Signs Assessment: post-procedure vital signs reviewed and stable Respiratory status: spontaneous breathing, nonlabored ventilation and respiratory function stable Cardiovascular status: stable and blood pressure returned to baseline Postop Assessment: no apparent nausea or vomiting Anesthetic complications: no    Last Vitals:  Vitals:   07/03/17 0750 07/03/17 0800  BP: 140/75 133/65  Pulse: 87 78  Resp: 17 20  Temp:    SpO2: 99% 98%    Last Pain:  Vitals:   07/03/17 0746  TempSrc: Oral                 Martice Doty,W. EDMOND

## 2017-07-03 NOTE — Discharge Instructions (Signed)

## 2017-07-03 NOTE — Transfer of Care (Signed)
Immediate Anesthesia Transfer of Care Note  Patient: Karen [name]  Procedure(s) Performed: ESOPHAGOGASTRODUODENOSCOPY (EGD) WITH PROPOFOL (N/A )  Patient Location: PACU  Anesthesia Type:MAC  Level of Consciousness: awake, alert  and oriented  Airway & Oxygen Therapy: Patient Spontanous Breathing and Patient connected to nasal cannula oxygen  Post-op Assessment: Report given to RN and Post -op Vital signs reviewed and stable  Post vital signs: Reviewed and stable  Last Vitals:  Vitals:   07/03/17 0652  BP: 118/68  Pulse: 85  Resp: 13  Temp: 36.7 C  SpO2: 100%    Last Pain:  Vitals:   07/03/17 0652  TempSrc: Oral         Complications: No apparent anesthesia complications

## 2017-07-03 NOTE — Op Note (Signed)
[facility] Patient Name: Karen [name] Procedure Date: 07/03/2017 MRN: [id number] Attending MD: Juanita Craver , MD Date of Birth: Jul 12, 1965 CSN: [id number] Age: 51 Admit Type: Inpatient Procedure:                Diagnostic EGD. Indications:              Epigastric pain, Gastro-esophageal reflux disease,                            Nausea, Iron deficiency anemia Providers:                Juanita Craver, MD, Carmie End, RN, Alan Mulder, Technician, Virgia Land, CRNA Referring MD:             Vicenta Aly, PA-C Medicines:                Monitored Anesthesia Care Complications:            No immediate complications. Estimated Blood Loss:     Estimated blood loss: none. Procedure:                Pre-anesthesia assessment: - Prior to the                            procedure, a history and physical was performed,                            and patient medications and allergies were                            reviewed. The patient's tolerance of previous                            anesthesia was also reviewed. The risks and                            benefits of the procedure and the sedation options                            and risks were discussed with the patient. All                            questions were answered, and informed consent was                            obtained. Prior anticoagulants: The patient has                            taken no previous anticoagulant or antiplatelet                            agents. ASA Grade assessment: III - A patient with  severe systemic disease. After reviewing the risks                            and benefits, the patient was deemed in                            satisfactory condition to undergo the procedure.                            After obtaining informed consent, the endoscope was                            passed under direct vision. Throughout the                  procedure, the patient's blood pressure, pulse, and                            oxygen saturations were monitored continuously. The                            [id number]-[id number] ([id number]) scope was introduced through the                            mouth, and advanced to the antrum of the stomach.                            The EGD was extremely difficult due to presence of                            food. The patient tolerated the procedure well. Scope In: Scope Out: Findings:      The examined esophagus and GEJ appeared widely patent and normal.      A large amount of food (residue) was found in the entire examined       stomach.      The cardia and gastric fundus were normal on retroflexion.      An examination of the duodenum was not performed. Impression:               - Normal appearing, widely patent esophagus and GEJ.                           - A large amount of food (residue) in the                            stomach-gastric mucosa not well visualized.                           - No specimens collected. Moderate Sedation:      MAC used. Recommendation:           - Recommend a gastric emptying study ASAP.                           - Return to my office in 4 weeks.                           -  Continue present medications.                           - Low fat, low residue diet daily. Procedure Code(s):        --- Professional ---                           [phone], Esophagogastroduodenoscopy, flexible,                            transoral; diagnostic, including collection of                            specimen(s) by brushing or washing, when performed                            (separate procedure) Diagnosis Code(s):        --- Professional ---                           R10.13, Epigastric pain                           K21.9, Gastro-esophageal reflux disease without                            esophagitis                           R11.0, Nausea                           D50.9,  Iron deficiency anemia, unspecified CPT copyright 2016 American Medical Association. All rights reserved. The codes documented in this report are preliminary and upon coder review may  be revised to meet current compliance requirements. Juanita Craver, MD Juanita Craver, MD 07/03/2017 7:49:47 AM This report has been signed electronically. Number of Addenda: 0

## 2017-07-03 NOTE — H&P (Signed)
Karen [name] is an 52 y.o. female.   Chief Complaint: GERD/Nausea/Iron deficiency anemia. HPI: Patient is here for a EGD. See office notes for details.  Past Medical History:  Diagnosis Date  . Anemia    iron deficiency  . Anxiety   . Arthritis    general -coccyx area, elbow  . Complication of anesthesia   . Diabetes mellitus without complication ([place])   . Diverticulosis   . GERD (gastroesophageal reflux disease)   . PONV (postoperative nausea and vomiting)    drop in blood pressure(spinal anesthesia)  . Sleep apnea    cpap used "automatic"   Past Surgical History:  Procedure Laterality Date  . ANAL FISSURE REPAIR     '07  . BREAST REDUCTION SURGERY     '88  . CESAREAN SECTION     x2  . COLONOSCOPY WITH PROPOFOL N/A 02/23/2015   Procedure: COLONOSCOPY WITH PROPOFOL;  Surgeon: Juanita Craver, MD;  Location: [facility];  Service: Endoscopy;  Laterality: N/A;  . KNEE ARTHROSCOPY W/ ACL RECONSTRUCTION Right    remains with a ? tear.  Marland Kitchen REDUCTION MAMMAPLASTY Bilateral 1988  . TONSILLECTOMY AND ADENOIDECTOMY     "Adenoids only"   History reviewed. No pertinent family history. Social History:  reports that  has never smoked. she has never used smokeless tobacco. She reports that she does not drink alcohol or use drugs.  Allergies:  Allergies  Allergen Reactions  . Sulfa Antibiotics Diarrhea, Nausea And Vomiting and Rash   Medications Prior to Admission  Medication Sig Dispense Refill  . amoxicillin (AMOXIL) 875 MG tablet Take 875 mg by mouth 2 (two) times daily. [place] 06-21-2017 FOR  URI    . buPROPion (WELLBUTRIN XL) 150 MG 24 hr tablet TAKE 2 TABLETS BY MOUTH EVERY  MORNING    . dexlansoprazole (DEXILANT) 60 MG capsule Take 1 capsule by mouth at bedtime.     . docusate sodium (COLACE) 100 MG capsule Take 100-200 mg by mouth daily.    Marland Kitchen escitalopram (LEXAPRO) 20 MG tablet Take 20 mg by mouth daily.    . ferrous sulfate 325 (65 FE) MG EC tablet Take 325 mg by  mouth daily.    Marland Kitchen ibuprofen (ADVIL,MOTRIN) 200 MG tablet Take 400 mg by mouth every 6 (six) hours as needed for headache or moderate pain.     . Liraglutide (VICTOZA) 18 MG/3ML SOPN Inject 1.8 mg into the skin daily.    . metFORMIN (GLUCOPHAGE) 1000 MG tablet Take 1,000 mg by mouth 2 (two) times daily.    . phentermine 37.5 MG capsule Take 37.5 mg by mouth every morning.      Results for orders placed or performed during the hospital encounter of 07/03/17 (from the past 48 hour(s))  Glucose, capillary     Status: Abnormal   Collection Time: 07/03/17  7:04 AM  Result Value Ref Range   Glucose-Capillary 135 (H) 65 - 99 mg/dL   No results found.  Review of Systems  Constitutional: Negative.   HENT: Negative.   Eyes: Negative.   Respiratory: Negative.   Cardiovascular: Negative.   Gastrointestinal: Positive for abdominal pain, heartburn and nausea. Negative for blood in stool, constipation, diarrhea, melena and vomiting.  Genitourinary: Negative.   Musculoskeletal: Negative.   Skin: Negative.   Neurological: Negative.   Endo/Heme/Allergies: Negative.   Psychiatric/Behavioral: Negative.    Blood pressure 118/68, pulse 85, temperature 98.1 F (36.7 C), temperature source Oral, resp. rate 13, height 5\' 7"  (1.702  m), weight (!) 139.7 kg (308 lb), SpO2 100 %. Physical Exam  Constitutional: She is oriented to person, place, and time. She appears well-developed and well-nourished.  HENT:  Head: Normocephalic and atraumatic.  Eyes: Conjunctivae and EOM are normal. Pupils are equal, round, and reactive to light.  Neck: Normal range of motion. Neck supple.  Cardiovascular: Normal rate and regular rhythm.  Respiratory: Effort normal and breath sounds normal.  GI: Soft. Bowel sounds are normal.  Musculoskeletal: Normal range of motion.  Neurological: She is alert and oriented to person, place, and time.  Skin: Skin is warm and dry.  Psychiatric: She has a normal mood and affect. Her  behavior is normal. Judgment and thought content normal.   Assessment/Plan Epigastric pain/GERD/Nausea-Proceed with a colonoscopy at this time.  Roxana Lai, MD 07/03/2017, 7:23 AM

## 2017-07-04 ENCOUNTER — Encounter (HOSPITAL_COMMUNITY): Payer: Self-pay | Admitting: Gastroenterology

## 2017-07-04 ENCOUNTER — Other Ambulatory Visit: Payer: Self-pay | Admitting: Gastroenterology

## 2017-07-04 DIAGNOSIS — R112 Nausea with vomiting, unspecified: Secondary | ICD-10-CM

## 2017-07-16 ENCOUNTER — Ambulatory Visit (HOSPITAL_COMMUNITY)
Admission: RE | Admit: 2017-07-16 | Discharge: 2017-07-16 | Disposition: A | Discharge status: Home / Self Care | Payer: BLUE CROSS/BLUE SHIELD | Source: Ambulatory Visit | Attending: Gastroenterology | Admitting: Gastroenterology

## 2017-07-16 DIAGNOSIS — R935 Abnormal findings on diagnostic imaging of other abdominal regions, including retroperitoneum: Secondary | ICD-10-CM | POA: Diagnosis present

## 2017-07-16 DIAGNOSIS — R1013 Epigastric pain: Secondary | ICD-10-CM | POA: Insufficient documentation

## 2017-07-16 DIAGNOSIS — R110 Nausea: Secondary | ICD-10-CM | POA: Diagnosis not present

## 2017-07-16 DIAGNOSIS — R112 Nausea with vomiting, unspecified: Secondary | ICD-10-CM

## 2017-07-16 MED ORDER — TECHNETIUM TC 99M SULFUR COLLOID
2.0000 | Freq: Once | INTRAVENOUS | Status: AC | PRN
  Administered 2017-07-16: 2 via ORAL

## 2017-08-11 HISTORY — PX: GALLBLADDER SURGERY: SHX652

## 2017-08-12 ENCOUNTER — Other Ambulatory Visit: Payer: Self-pay | Admitting: Gastroenterology

## 2017-08-12 DIAGNOSIS — R112 Nausea with vomiting, unspecified: Secondary | ICD-10-CM

## 2017-08-21 ENCOUNTER — Ambulatory Visit
Admission: RE | Admit: 2017-08-21 | Discharge: 2017-08-21 | Disposition: A | Discharge status: Home / Self Care | Payer: BLUE CROSS/BLUE SHIELD | Source: Ambulatory Visit | Attending: Gastroenterology | Admitting: Gastroenterology

## 2017-08-21 DIAGNOSIS — R112 Nausea with vomiting, unspecified: Secondary | ICD-10-CM

## 2017-08-21 MED ORDER — IOPAMIDOL (ISOVUE-300) INJECTION 61%
125.0000 mL | Freq: Once | INTRAVENOUS | Status: AC | PRN
  Administered 2017-08-21: 125 mL via INTRAVENOUS

## 2017-12-02 ENCOUNTER — Encounter (HOSPITAL_COMMUNITY): Payer: Self-pay | Admitting: Emergency Medicine

## 2017-12-02 ENCOUNTER — Other Ambulatory Visit: Payer: Self-pay

## 2017-12-02 ENCOUNTER — Emergency Department (HOSPITAL_COMMUNITY)
Admission: EM | Admit: 2017-12-02 | Discharge: 2017-12-03 | Disposition: A | Discharge status: Home / Self Care | Payer: BLUE CROSS/BLUE SHIELD | Attending: Emergency Medicine | Admitting: Emergency Medicine

## 2017-12-02 DIAGNOSIS — E119 Type 2 diabetes mellitus without complications: Secondary | ICD-10-CM | POA: Insufficient documentation

## 2017-12-02 DIAGNOSIS — Z79899 Other long term (current) drug therapy: Secondary | ICD-10-CM | POA: Diagnosis not present

## 2017-12-02 DIAGNOSIS — Z7984 Long term (current) use of oral hypoglycemic drugs: Secondary | ICD-10-CM | POA: Insufficient documentation

## 2017-12-02 DIAGNOSIS — R1013 Epigastric pain: Secondary | ICD-10-CM | POA: Insufficient documentation

## 2017-12-02 DIAGNOSIS — R112 Nausea with vomiting, unspecified: Secondary | ICD-10-CM | POA: Insufficient documentation

## 2017-12-02 LAB — COMPREHENSIVE METABOLIC PANEL
ALT: 26 U/L (ref 0–44)
ANION GAP: 13 (ref 5–15)
AST: 33 U/L (ref 15–41)
Albumin: 4.6 g/dL (ref 3.5–5.0)
Alkaline Phosphatase: 71 U/L (ref 38–126)
BUN: 19 mg/dL (ref 6–20)
CHLORIDE: 103 mmol/L (ref 98–111)
CO2: 22 mmol/L (ref 22–32)
Calcium: 10.2 mg/dL (ref 8.9–10.3)
Creatinine, Ser: 0.81 mg/dL (ref 0.44–1.00)
Glucose, Bld: 131 mg/dL — ABNORMAL HIGH (ref 70–99)
Potassium: 4.7 mmol/L (ref 3.5–5.1)
Sodium: 138 mmol/L (ref 135–145)
Total Bilirubin: 0.9 mg/dL (ref 0.3–1.2)
Total Protein: 8.4 g/dL — ABNORMAL HIGH (ref 6.5–8.1)

## 2017-12-02 LAB — CBC
HCT: 44.1 % (ref 36.0–46.0)
HEMOGLOBIN: 13.9 g/dL (ref 12.0–15.0)
MCH: 25 pg — AB (ref 26.0–34.0)
MCHC: 31.5 g/dL (ref 30.0–36.0)
MCV: 79.2 fL (ref 78.0–100.0)
PLATELETS: 321 10*3/uL (ref 150–400)
RBC: 5.57 MIL/uL — AB (ref 3.87–5.11)
RDW: 14.3 % (ref 11.5–15.5)
WBC: 7.9 10*3/uL (ref 4.0–10.5)

## 2017-12-02 LAB — URINALYSIS, ROUTINE W REFLEX MICROSCOPIC
BILIRUBIN URINE: NEGATIVE
Glucose, UA: NEGATIVE mg/dL
HGB URINE DIPSTICK: NEGATIVE
KETONES UR: NEGATIVE mg/dL
Nitrite: NEGATIVE
PH: 5 (ref 5.0–8.0)
Protein, ur: NEGATIVE mg/dL
SPECIFIC GRAVITY, URINE: 1.02 (ref 1.005–1.030)

## 2017-12-02 LAB — LIPASE, BLOOD: Lipase: 38 U/L (ref 11–51)

## 2017-12-02 MED ORDER — ONDANSETRON HCL 4 MG/2ML IJ SOLN
4.0000 mg | Freq: Once | INTRAMUSCULAR | Status: AC
  Administered 2017-12-02: 4 mg via INTRAVENOUS
  Filled 2017-12-02: qty 2

## 2017-12-02 MED ORDER — ONDANSETRON 4 MG PO TBDP
4.0000 mg | ORAL_TABLET | Freq: Once | ORAL | Status: AC
Start: 2017-12-02 — End: 2017-12-02
  Administered 2017-12-02: 4 mg via ORAL
  Filled 2017-12-02: qty 1

## 2017-12-02 MED ORDER — SODIUM CHLORIDE 0.9 % IV BOLUS
1000.0000 mL | Freq: Once | INTRAVENOUS | Status: AC
  Administered 2017-12-02: 1000 mL via INTRAVENOUS

## 2017-12-02 NOTE — ED Triage Notes (Signed)
Pt. Stated, Ive had abdominal pain for 9 months. Ive had numerous G I Dr for test. Endoscopy, emptying stomach.  One Dr said I HAD A GALL STONE BUT THAT DOESN'T MEAN I NEED MY GALL BLADDER. This afternoon I started having to throw up with nausea. Ive also had some SOB episodes.

## 2017-12-02 NOTE — ED Triage Notes (Signed)
Pt. Vomiting in triage.

## 2017-12-02 NOTE — ED Notes (Signed)
Will getting blood pt had an episode of emesis.

## 2017-12-03 LAB — TROPONIN I: Troponin I: <0.03 ng/mL (ref <0.03)

## 2017-12-03 MED ORDER — METOCLOPRAMIDE HCL 10 MG PO TABS: 10.0000 mg | ORAL_TABLET | Freq: Four times a day (QID) | ORAL | 0 refills | Status: DC

## 2017-12-03 NOTE — Discharge Instructions (Addendum)
Take Reglan for nausea and to see if it helps with abdominal pain. Follow up with your primary care provider for recheck and care until your scheduled appointment in [facility]. Return here as needed for new or worsening symptoms.

## 2017-12-03 NOTE — ED Provider Notes (Signed)
[facility] EMERGENCY DEPARTMENT Provider Note   CSN: [id number] Arrival date & time: 12/02/17  1600     History   Chief Complaint Chief Complaint  Patient presents with  . Abdominal Pain  . Emesis  . Nausea  . Shortness of Breath    HPI Karen [name] is a 52 y.o. female.  Patient presents with epigastric abdominal pain, nausea and vomiting. She has been undergoing evaluation in the outpatient setting by primary care and gastroenterology for similar symptoms for the past year with, so far, negative findings including EGD, [place], gastric emptying. She has been on Dexilant but continues to have symptoms. She has started GI care on [facility] and has a repeat EGD scheduled for August 28th. She states over the last week she has vomiting 4 times, more than her usual, and her pain today was associated with more intense nausea, vomiting, diaphoresis, SOB and lightheadedness. No syncope, fever, hematemesis.   The history is provided by the patient and the spouse. No language interpreter was used.  Abdominal Pain   Associated symptoms include nausea and vomiting. Pertinent negatives include fever and dysuria.  Emesis   Associated symptoms include abdominal pain. Pertinent negatives include no chills, no cough and no fever.  Shortness of Breath  Associated symptoms include vomiting and abdominal pain. Pertinent negatives include no fever, no cough and no chest pain.    Past Medical History:  Diagnosis Date  . Anemia    iron deficiency  . Anxiety   . Arthritis    general -coccyx area, elbow  . Complication of anesthesia   . Diabetes mellitus without complication ([place])   . Diverticulosis   . GERD (gastroesophageal reflux disease)   . PONV (postoperative nausea and vomiting)    drop in blood pressure(spinal anesthesia)  . Sleep apnea    cpap used "automatic"    There are no active problems to display for this patient.   Past Surgical History:  Procedure Laterality Date   . ANAL FISSURE REPAIR     '07  . BREAST REDUCTION SURGERY     '88  . CESAREAN SECTION     x2  . COLONOSCOPY WITH PROPOFOL N/A 02/23/2015   Procedure: COLONOSCOPY WITH PROPOFOL;  Surgeon: Juanita Craver, MD;  Location: [facility];  Service: Endoscopy;  Laterality: N/A;  . ESOPHAGOGASTRODUODENOSCOPY (EGD) WITH PROPOFOL N/A 07/03/2017   Procedure: ESOPHAGOGASTRODUODENOSCOPY (EGD) WITH PROPOFOL;  Surgeon: Juanita Craver, MD;  Location: [facility];  Service: Endoscopy;  Laterality: N/A;  . KNEE ARTHROSCOPY W/ ACL RECONSTRUCTION Right    remains with a ? tear.  Marland Kitchen REDUCTION MAMMAPLASTY Bilateral 1988  . TONSILLECTOMY AND ADENOIDECTOMY     "Adenoids only"     OB History   None      Home Medications    Prior to Admission medications   Medication Sig Start Date End Date Taking? Authorizing Provider  amoxicillin (AMOXIL) 875 MG tablet Take 875 mg by mouth 2 (two) times daily. [place] 06-21-2017 FOR  URI    [provider]  buPROPion (WELLBUTRIN XL) 150 MG 24 hr tablet TAKE 2 TABLETS BY MOUTH EVERY  MORNING 01/17/17   [provider]  dexlansoprazole (DEXILANT) 60 MG capsule Take 1 capsule by mouth at bedtime.  01/19/14   [provider]  docusate sodium (COLACE) 100 MG capsule Take 100-200 mg by mouth daily.    [provider]  escitalopram (LEXAPRO) 20 MG tablet Take 20 mg by mouth daily. 10/07/13  [provider]  ferrous sulfate 325 (65 FE) MG EC tablet Take 325 mg by mouth daily.    [provider]  Liraglutide (VICTOZA) 18 MG/3ML SOPN Inject 1.8 mg into the skin daily. 10/07/13   [provider]  metFORMIN (GLUCOPHAGE) 1000 MG tablet Take 1,000 mg by mouth 2 (two) times daily. 10/07/13   [provider]  phentermine 37.5 MG capsule Take 37.5 mg by mouth every morning.    [provider]    Family History No family history on file.  Social History Social History   Tobacco Use  . Smoking  status: Never Smoker  . Smokeless tobacco: Never Used  Substance Use Topics  . Alcohol use: No  . Drug use: No     Allergies   Sulfa antibiotics   Review of Systems Review of Systems  Constitutional: Negative for chills and fever.  Respiratory: Positive for shortness of breath. Negative for cough.   Cardiovascular: Negative.  Negative for chest pain.  Gastrointestinal: Positive for abdominal pain, nausea and vomiting.  Genitourinary: Negative.  Negative for decreased urine volume and dysuria.  Musculoskeletal: Negative.   Skin: Negative.   Neurological: Positive for light-headedness. Negative for syncope.     Physical Exam Updated Vital Signs BP 124/67 (BP Location: Left Arm)   Pulse 81   Temp 98.7 F (37.1 C) (Oral)   Resp 18   Ht 5\' 6"  (1.676 m)   Wt (!) 142.9 kg (315 lb)   SpO2 98%   BMI 50.84 kg/m   Physical Exam  Constitutional: She appears well-developed and well-nourished. She does not appear ill.  HENT:  Head: Normocephalic.  Neck: Normal range of motion. Neck supple.  Cardiovascular: Normal rate and regular rhythm.  No murmur heard. Pulmonary/Chest: Effort normal and breath sounds normal.  Abdominal: Soft. Bowel sounds are normal. There is tenderness in the epigastric area. There is no rebound and no guarding.  Musculoskeletal: Normal range of motion.  Neurological: She is alert. No cranial nerve deficit.  Skin: Skin is warm and dry. No rash noted.  Psychiatric: She has a normal mood and affect.     ED Treatments / Results  Labs (all labs ordered are listed, but only abnormal results are displayed) Labs Reviewed  COMPREHENSIVE METABOLIC PANEL - Abnormal; Notable for the following components:      Result Value   Glucose, Bld 131 (*)    Total Protein 8.4 (*)    All other components within normal limits  CBC - Abnormal; Notable for the following components:   RBC 5.57 (*)    MCH 25.0 (*)    All other components within normal limits  URINALYSIS,  ROUTINE W REFLEX MICROSCOPIC - Abnormal; Notable for the following components:   APPearance HAZY (*)    Leukocytes, UA MODERATE (*)    Bacteria, UA RARE (*)    All other components within normal limits  LIPASE, BLOOD  TROPONIN I    EKG EKG Interpretation  Date/Time:  Tuesday December 02 2017 16:24:15 EDT Ventricular Rate:  98 PR Interval:  136 QRS Duration: 76 QT Interval:  336 QTC Calculation: 428 R Axis:   63 Text Interpretation:  Normal sinus rhythm Nonspecific ST abnormality Abnormal ECG Confirmed by Ripley Fraise [phone]) on 12/03/2017 12:23:46 AM   Radiology No results found.  Procedures Procedures (including critical care time)  Medications Ordered in ED Medications  ondansetron (ZOFRAN-ODT) disintegrating tablet 4 mg (4 mg Oral Given 12/02/17 1628)  sodium chloride 0.9 % bolus  1,000 mL (1,000 mLs Intravenous New Bag/Given 12/02/17 2312)  ondansetron (ZOFRAN) injection 4 mg (4 mg Intravenous Given 12/02/17 2323)     Initial Impression / Assessment and Plan / ED Course  I have reviewed the triage vital signs and the nursing notes.  Pertinent labs & imaging results that were available during my care of the patient were reviewed by me and considered in my medical decision making (see chart for details).     Patient with history x 1 year of GI pain, vomiting, presents with acutely worsening symptoms today, including new sxs of diaphoresis, SOB, increased nausea.   Labs are reassuring. She has risk factors for heart disease so troponin was tested and is normal. No ischemic EKG changes.   She is given IVF's here with IV Zofran and reports feeling much better. Pain has subsided. No further nausea or lightheadedness.   Discussed discharge home. Patient and husband comfortable with this plan. Will Rx Reglan and encourage PCP follow up for maintenance of care until follow up appt in [facility].   Final Clinical Impressions(s) / ED Diagnoses   Final diagnoses:  None    1. Epigastric pain 2. Nausea and vomiting   ED Discharge Orders    None       Charlann Lange, PA-C 12/03/17 0037    Varney Biles, MD 12/03/17 [phone]

## 2017-12-19 DIAGNOSIS — Z882 Allergy status to sulfonamides status: Secondary | ICD-10-CM | POA: Diagnosis not present

## 2017-12-19 DIAGNOSIS — K2930 Chronic superficial gastritis without bleeding: Secondary | ICD-10-CM | POA: Diagnosis not present

## 2017-12-19 DIAGNOSIS — R198 Other specified symptoms and signs involving the digestive system and abdomen: Secondary | ICD-10-CM | POA: Diagnosis not present

## 2017-12-19 DIAGNOSIS — Z79899 Other long term (current) drug therapy: Secondary | ICD-10-CM | POA: Diagnosis not present

## 2017-12-19 DIAGNOSIS — K3189 Other diseases of stomach and duodenum: Secondary | ICD-10-CM | POA: Diagnosis not present

## 2017-12-19 DIAGNOSIS — G4730 Sleep apnea, unspecified: Secondary | ICD-10-CM | POA: Diagnosis not present

## 2017-12-19 DIAGNOSIS — E669 Obesity, unspecified: Secondary | ICD-10-CM | POA: Diagnosis not present

## 2017-12-19 DIAGNOSIS — K317 Polyp of stomach and duodenum: Secondary | ICD-10-CM | POA: Diagnosis not present

## 2017-12-19 DIAGNOSIS — K219 Gastro-esophageal reflux disease without esophagitis: Secondary | ICD-10-CM | POA: Diagnosis not present

## 2017-12-19 DIAGNOSIS — E119 Type 2 diabetes mellitus without complications: Secondary | ICD-10-CM | POA: Diagnosis not present

## 2017-12-19 DIAGNOSIS — K319 Disease of stomach and duodenum, unspecified: Secondary | ICD-10-CM | POA: Diagnosis not present

## 2017-12-19 DIAGNOSIS — Z6841 Body Mass Index (BMI) 40.0 and over, adult: Secondary | ICD-10-CM | POA: Diagnosis not present

## 2017-12-19 DIAGNOSIS — R110 Nausea: Secondary | ICD-10-CM | POA: Diagnosis not present

## 2017-12-19 DIAGNOSIS — Z7984 Long term (current) use of oral hypoglycemic drugs: Secondary | ICD-10-CM | POA: Diagnosis not present

## 2018-01-19 DIAGNOSIS — K8020 Calculus of gallbladder without cholecystitis without obstruction: Secondary | ICD-10-CM | POA: Diagnosis not present

## 2018-01-19 DIAGNOSIS — Z6841 Body Mass Index (BMI) 40.0 and over, adult: Secondary | ICD-10-CM | POA: Diagnosis not present

## 2018-01-22 DIAGNOSIS — Z7984 Long term (current) use of oral hypoglycemic drugs: Secondary | ICD-10-CM | POA: Diagnosis not present

## 2018-01-22 DIAGNOSIS — K8020 Calculus of gallbladder without cholecystitis without obstruction: Secondary | ICD-10-CM | POA: Diagnosis not present

## 2018-01-22 DIAGNOSIS — K219 Gastro-esophageal reflux disease without esophagitis: Secondary | ICD-10-CM | POA: Diagnosis not present

## 2018-01-22 DIAGNOSIS — Z6841 Body Mass Index (BMI) 40.0 and over, adult: Secondary | ICD-10-CM | POA: Diagnosis not present

## 2018-01-22 DIAGNOSIS — E669 Obesity, unspecified: Secondary | ICD-10-CM | POA: Diagnosis not present

## 2018-01-22 DIAGNOSIS — E119 Type 2 diabetes mellitus without complications: Secondary | ICD-10-CM | POA: Diagnosis not present

## 2018-01-22 DIAGNOSIS — K8010 Calculus of gallbladder with chronic cholecystitis without obstruction: Secondary | ICD-10-CM | POA: Diagnosis not present

## 2018-01-22 DIAGNOSIS — Z882 Allergy status to sulfonamides status: Secondary | ICD-10-CM | POA: Diagnosis not present

## 2018-02-17 ENCOUNTER — Other Ambulatory Visit: Payer: Self-pay | Admitting: Nurse Practitioner

## 2018-02-17 DIAGNOSIS — Z1231 Encounter for screening mammogram for malignant neoplasm of breast: Secondary | ICD-10-CM

## 2018-02-27 DIAGNOSIS — G4733 Obstructive sleep apnea (adult) (pediatric): Secondary | ICD-10-CM | POA: Diagnosis not present

## 2018-03-02 DIAGNOSIS — Z09 Encounter for follow-up examination after completed treatment for conditions other than malignant neoplasm: Secondary | ICD-10-CM | POA: Diagnosis not present

## 2018-03-02 DIAGNOSIS — K8020 Calculus of gallbladder without cholecystitis without obstruction: Secondary | ICD-10-CM | POA: Diagnosis not present

## 2018-03-12 DIAGNOSIS — R82998 Other abnormal findings in urine: Secondary | ICD-10-CM | POA: Diagnosis not present

## 2018-03-12 DIAGNOSIS — E119 Type 2 diabetes mellitus without complications: Secondary | ICD-10-CM | POA: Diagnosis not present

## 2018-03-12 DIAGNOSIS — R42 Dizziness and giddiness: Secondary | ICD-10-CM | POA: Diagnosis not present

## 2018-03-16 DIAGNOSIS — Z6841 Body Mass Index (BMI) 40.0 and over, adult: Secondary | ICD-10-CM | POA: Diagnosis not present

## 2018-03-16 DIAGNOSIS — F509 Eating disorder, unspecified: Secondary | ICD-10-CM | POA: Diagnosis not present

## 2018-03-16 DIAGNOSIS — E119 Type 2 diabetes mellitus without complications: Secondary | ICD-10-CM | POA: Diagnosis not present

## 2018-03-18 DIAGNOSIS — E119 Type 2 diabetes mellitus without complications: Secondary | ICD-10-CM | POA: Diagnosis not present

## 2018-03-25 DIAGNOSIS — E119 Type 2 diabetes mellitus without complications: Secondary | ICD-10-CM | POA: Diagnosis not present

## 2018-03-25 DIAGNOSIS — Z6841 Body Mass Index (BMI) 40.0 and over, adult: Secondary | ICD-10-CM | POA: Diagnosis not present

## 2018-03-25 DIAGNOSIS — Z713 Dietary counseling and surveillance: Secondary | ICD-10-CM | POA: Diagnosis not present

## 2018-03-26 ENCOUNTER — Ambulatory Visit
Admission: RE | Admit: 2018-03-26 | Discharge: 2018-03-26 | Disposition: A | Discharge status: Home / Self Care | Payer: BLUE CROSS/BLUE SHIELD | Source: Ambulatory Visit | Attending: Nurse Practitioner | Admitting: Nurse Practitioner

## 2018-03-26 DIAGNOSIS — Z1231 Encounter for screening mammogram for malignant neoplasm of breast: Secondary | ICD-10-CM

## 2018-04-14 DIAGNOSIS — R5383 Other fatigue: Secondary | ICD-10-CM | POA: Diagnosis not present

## 2018-04-14 DIAGNOSIS — R5381 Other malaise: Secondary | ICD-10-CM | POA: Diagnosis not present

## 2018-04-14 DIAGNOSIS — F329 Major depressive disorder, single episode, unspecified: Secondary | ICD-10-CM | POA: Diagnosis not present

## 2018-04-14 DIAGNOSIS — Z0000 Encounter for general adult medical examination without abnormal findings: Secondary | ICD-10-CM | POA: Diagnosis not present

## 2018-04-14 DIAGNOSIS — E782 Mixed hyperlipidemia: Secondary | ICD-10-CM | POA: Diagnosis not present

## 2018-04-14 DIAGNOSIS — E119 Type 2 diabetes mellitus without complications: Secondary | ICD-10-CM | POA: Diagnosis not present

## 2018-04-27 ENCOUNTER — Ambulatory Visit (INDEPENDENT_AMBULATORY_CARE_PROVIDER_SITE_OTHER): Payer: BLUE CROSS/BLUE SHIELD | Admitting: Internal Medicine

## 2018-04-27 ENCOUNTER — Encounter: Payer: Self-pay | Admitting: Internal Medicine

## 2018-04-27 VITALS — BP 114/76 | HR 75 | Ht 65.5 in | Wt 315.8 lb

## 2018-04-27 DIAGNOSIS — G4733 Obstructive sleep apnea (adult) (pediatric): Secondary | ICD-10-CM | POA: Diagnosis not present

## 2018-04-27 DIAGNOSIS — K219 Gastro-esophageal reflux disease without esophagitis: Secondary | ICD-10-CM | POA: Diagnosis not present

## 2018-04-27 NOTE — Patient Instructions (Signed)
Order- [facility]  Please replace old CPAP machine, continue auto 10-20, mask of choice, humidifier, supplies, [place]    Please add chin strap  Order- [facility] [place] Fitting. Consider nasal pillows with chin strap  Please call as needed

## 2018-04-27 NOTE — Progress Notes (Signed)
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 -----Sleep Consult: Pt had sleep study several years ago and has been on CPAP tx for about 10 years give or take. DL attached. [facility]. Problem list includes morbid obesity, DM 2 Body weight today 315 pounds Epworth score 7 CPAP 10-20/[facility] Download 100% compliance AHI 2.2/hour She complains current mask is been noisy and leaking last few months.  Her machine is 50 years old.  She is noticing more daytime sleepiness, not feeling rested when she wakes up. ENT surgery-none, denies heart or lung disease.  Prior to Admission medications   Medication Sig Start Date End Date Taking? Authorizing Provider  buPROPion (WELLBUTRIN XL) 150 MG 24 hr tablet TAKE 2 TABLETS BY MOUTH EVERY  MORNING 01/17/17  Yes [provider]  dexlansoprazole (DEXILANT) 60 MG capsule Take 1 capsule by mouth at bedtime.  01/19/14  Yes [provider]  docusate sodium (COLACE) 100 MG capsule Take 100-200 mg by mouth daily.   Yes [provider]  escitalopram (LEXAPRO) 20 MG tablet Take 20 mg by mouth daily. 10/07/13  Yes [provider]  Liraglutide (VICTOZA) 18 MG/3ML SOPN Inject 1.8 mg into the skin daily. 10/07/13  Yes [provider]  metFORMIN (GLUCOPHAGE) 1000 MG tablet Take 1,000 mg by mouth 2 (two) times daily. 10/07/13  Yes [provider]  Phentermine-Topiramate 7.5-46 MG CP24 Take 1 capsule by mouth daily. 03/16/18  Yes [provider]   Past Medical History:  Diagnosis Date  . Anemia    iron deficiency  . Anxiety   . Arthritis    general -coccyx area, elbow  . Complication of anesthesia   . Diabetes mellitus without complication ([place])   . Diverticulosis   . GERD (gastroesophageal reflux disease)   . PONV (postoperative nausea and vomiting)    drop in blood pressure(spinal anesthesia)  . Sleep apnea    cpap used "automatic"   Past Surgical History:   Procedure Laterality Date  . ANAL FISSURE REPAIR     '07  . BREAST REDUCTION SURGERY     '88  . CESAREAN SECTION     x2  . COLONOSCOPY WITH PROPOFOL N/A 02/23/2015   Procedure: COLONOSCOPY WITH PROPOFOL;  Surgeon: Juanita Craver, MD;  Location: [facility];  Service: Endoscopy;  Laterality: N/A;  . ESOPHAGOGASTRODUODENOSCOPY (EGD) WITH PROPOFOL N/A 07/03/2017   Procedure: ESOPHAGOGASTRODUODENOSCOPY (EGD) WITH PROPOFOL;  Surgeon: Juanita Craver, MD;  Location: [facility];  Service: Endoscopy;  Laterality: N/A;  . KNEE ARTHROSCOPY W/ ACL RECONSTRUCTION Right    remains with a ? tear.  Marland Kitchen REDUCTION MAMMAPLASTY Bilateral 1988  . TONSILLECTOMY AND ADENOIDECTOMY     "Adenoids only"   Family History  Adopted: Yes   Social History   Socioeconomic History  . Marital status: Married    Spouse name: Not on file  . Number of children: Not on file  . Years of education: Not on file  . Highest education level: Not on file  Occupational History  . Not on file  Social Needs  . Financial resource strain: Not on file  . Food insecurity:    Worry: Not on file    Inability: Not on file  . Transportation needs:    Medical: Not on file    Non-medical: Not on file  Tobacco Use  . Smoking status: Never Smoker  . Smokeless tobacco: Never Used  Substance and Sexual Activity  . Alcohol use: No  . Drug use: No  .  Sexual activity: Not on file  Lifestyle  . Physical activity:    Days per week: Not on file    Minutes per session: Not on file  . Stress: Not on file  Relationships  . Social connections:    Talks on phone: Not on file    Gets together: Not on file    Attends religious service: Not on file    Active member of club or organization: Not on file    Attends meetings of clubs or organizations: Not on file    Relationship status: Not on file  . Intimate partner violence:    Fear of current or ex partner: Not on file    Emotionally abused: Not on file    Physically abused: Not on  file    Forced sexual activity: Not on file  Other Topics Concern  . Not on file  Social History Narrative  . Not on file   ROS-see HPI   + = positive Constitutional:    weight loss, night sweats, fevers, chills, fatigue, lassitude. HEENT:    headaches, difficulty swallowing, tooth/dental problems, sore throat,       sneezing, itching, ear ache, nasal congestion, post nasal drip, snoring CV:    chest pain, orthopnea, PND, swelling in lower extremities, anasarca,                                  dizziness, palpitations Resp:   shortness of breath with exertion or at rest.                productive cough,   non-productive cough, coughing up of blood.              change in color of mucus.  wheezing.   Skin:    rash or lesions. GI:  +  heartburn, indigestion, abdominal pain, nausea, vomiting, diarrhea,                 change in bowel habits, loss of appetite GU: dysuria, change in color of urine, no urgency or frequency.   flank pain. MS:   joint pain, stiffness, decreased range of motion, back pain. Neuro-     nothing unusual Psych:  change in mood or affect.  depression or +anxiety.   memory loss.  OBJ- Physical Exam General- Alert, Oriented, Affect-appropriate, Distress- none acute + morbidly obese Skin- rash-none, lesions- none, excoriation- none Lymphadenopathy- none Head- atraumatic            Eyes- Gross vision intact, PERRLA, conjunctivae and secretions clear            Ears- Hearing, canals-normal            Nose- Clear, no-Septal dev, mucus, polyps, erosion, perforation             Throat- Mallampati III , mucosa clear , drainage- none, tonsils+ atrophic Neck- flexible , trachea midline, no stridor , thyroid nl, carotid no bruit Chest - symmetrical excursion , unlabored           Heart/CV- RRR , no murmur , no gallop  , no rub, nl s1 s2                           - JVD- none , edema- none, stasis changes- none, varices- none           Lung- clear to P&A, wheeze-  none, cough-  none , dullness-none, rub- none           Chest wall-  Abd-  Br/ Gen/ Rectal- Not done, not indicated Extrem- cyanosis- none, clubbing, none, atrophy- none, strength- nl Neuro- grossly intact to observation

## 2018-04-29 ENCOUNTER — Institutional Professional Consult (permissible substitution): Payer: BLUE CROSS/BLUE SHIELD | Admitting: Pulmonary Disease

## 2018-05-01 ENCOUNTER — Ambulatory Visit (HOSPITAL_BASED_OUTPATIENT_CLINIC_OR_DEPARTMENT_OTHER): Payer: BLUE CROSS/BLUE SHIELD | Attending: Internal Medicine

## 2018-05-01 DIAGNOSIS — G4733 Obstructive sleep apnea (adult) (pediatric): Secondary | ICD-10-CM

## 2018-05-03 DIAGNOSIS — K219 Gastro-esophageal reflux disease without esophagitis: Secondary | ICD-10-CM | POA: Insufficient documentation

## 2018-05-03 DIAGNOSIS — G4733 Obstructive sleep apnea (adult) (pediatric): Secondary | ICD-10-CM | POA: Insufficient documentation

## 2018-05-03 NOTE — Assessment & Plan Note (Signed)
Describes this as an active symptom and is encouraged to discuss it with her PCP.

## 2018-05-03 NOTE — Assessment & Plan Note (Signed)
She describes benefit from CPAP and download confirms excellent compliance and control.  Mask leak has increased and this may correspond to unrefreshing sleep she complains of.  Machine is old. Plan-replace old CPAP machine, continuing auto 10-20 and replacing mask of choice.  Has to sleep hygiene to ensure she allows herself enough time to sleep.

## 2018-05-20 DIAGNOSIS — E119 Type 2 diabetes mellitus without complications: Secondary | ICD-10-CM | POA: Diagnosis not present

## 2018-05-20 DIAGNOSIS — F329 Major depressive disorder, single episode, unspecified: Secondary | ICD-10-CM | POA: Diagnosis not present

## 2018-05-20 DIAGNOSIS — Z6841 Body Mass Index (BMI) 40.0 and over, adult: Secondary | ICD-10-CM | POA: Diagnosis not present

## 2018-06-17 DIAGNOSIS — G4733 Obstructive sleep apnea (adult) (pediatric): Secondary | ICD-10-CM | POA: Diagnosis not present

## 2018-07-14 ENCOUNTER — Ambulatory Visit (INDEPENDENT_AMBULATORY_CARE_PROVIDER_SITE_OTHER): Payer: BLUE CROSS/BLUE SHIELD | Admitting: Licensed Clinical Social Worker

## 2018-07-14 DIAGNOSIS — F331 Major depressive disorder, recurrent, moderate: Secondary | ICD-10-CM

## 2018-07-16 DIAGNOSIS — G4733 Obstructive sleep apnea (adult) (pediatric): Secondary | ICD-10-CM | POA: Diagnosis not present

## 2018-07-22 ENCOUNTER — Ambulatory Visit (INDEPENDENT_AMBULATORY_CARE_PROVIDER_SITE_OTHER): Payer: BLUE CROSS/BLUE SHIELD | Admitting: Licensed Clinical Social Worker

## 2018-07-22 DIAGNOSIS — F331 Major depressive disorder, recurrent, moderate: Secondary | ICD-10-CM | POA: Diagnosis not present

## 2018-07-28 ENCOUNTER — Ambulatory Visit: Payer: BLUE CROSS/BLUE SHIELD | Admitting: Internal Medicine

## 2018-08-04 ENCOUNTER — Ambulatory Visit: Payer: BLUE CROSS/BLUE SHIELD | Admitting: Licensed Clinical Social Worker

## 2018-08-11 ENCOUNTER — Ambulatory Visit: Payer: BLUE CROSS/BLUE SHIELD | Admitting: Licensed Clinical Social Worker

## 2018-08-16 DIAGNOSIS — G4733 Obstructive sleep apnea (adult) (pediatric): Secondary | ICD-10-CM | POA: Diagnosis not present

## 2018-08-18 ENCOUNTER — Ambulatory Visit: Payer: BLUE CROSS/BLUE SHIELD | Admitting: Licensed Clinical Social Worker

## 2018-08-20 DIAGNOSIS — F411 Generalized anxiety disorder: Secondary | ICD-10-CM | POA: Diagnosis not present

## 2018-08-20 DIAGNOSIS — F329 Major depressive disorder, single episode, unspecified: Secondary | ICD-10-CM | POA: Diagnosis not present

## 2018-08-20 DIAGNOSIS — E782 Mixed hyperlipidemia: Secondary | ICD-10-CM | POA: Diagnosis not present

## 2018-08-20 DIAGNOSIS — E119 Type 2 diabetes mellitus without complications: Secondary | ICD-10-CM | POA: Diagnosis not present

## 2018-08-25 ENCOUNTER — Ambulatory Visit: Payer: BLUE CROSS/BLUE SHIELD | Admitting: Licensed Clinical Social Worker

## 2018-09-03 ENCOUNTER — Ambulatory Visit (INDEPENDENT_AMBULATORY_CARE_PROVIDER_SITE_OTHER): Payer: BLUE CROSS/BLUE SHIELD | Admitting: Licensed Clinical Social Worker

## 2018-09-03 ENCOUNTER — Ambulatory Visit: Payer: BLUE CROSS/BLUE SHIELD | Admitting: Licensed Clinical Social Worker

## 2018-09-03 DIAGNOSIS — F3341 Major depressive disorder, recurrent, in partial remission: Secondary | ICD-10-CM

## 2018-09-08 ENCOUNTER — Ambulatory Visit: Payer: BLUE CROSS/BLUE SHIELD | Admitting: Licensed Clinical Social Worker

## 2018-09-10 ENCOUNTER — Ambulatory Visit (INDEPENDENT_AMBULATORY_CARE_PROVIDER_SITE_OTHER): Payer: BLUE CROSS/BLUE SHIELD | Admitting: Licensed Clinical Social Worker

## 2018-09-10 DIAGNOSIS — F331 Major depressive disorder, recurrent, moderate: Secondary | ICD-10-CM | POA: Diagnosis not present

## 2018-09-15 DIAGNOSIS — G4733 Obstructive sleep apnea (adult) (pediatric): Secondary | ICD-10-CM | POA: Diagnosis not present

## 2018-09-16 ENCOUNTER — Ambulatory Visit (INDEPENDENT_AMBULATORY_CARE_PROVIDER_SITE_OTHER): Payer: BLUE CROSS/BLUE SHIELD | Admitting: Licensed Clinical Social Worker

## 2018-09-16 DIAGNOSIS — F3341 Major depressive disorder, recurrent, in partial remission: Secondary | ICD-10-CM | POA: Diagnosis not present

## 2018-09-18 ENCOUNTER — Telehealth: Payer: Self-pay | Admitting: Internal Medicine

## 2018-09-18 DIAGNOSIS — G4733 Obstructive sleep apnea (adult) (pediatric): Secondary | ICD-10-CM

## 2018-09-18 NOTE — Telephone Encounter (Signed)
Order- [facility]   Please replace CPAP mask of choice and supplies

## 2018-09-18 NOTE — Telephone Encounter (Signed)
Order placed for cpap mask of choice and supplies Pending physician signature. Pt aware.

## 2018-09-18 NOTE — Telephone Encounter (Signed)
Spoke with pt, she is requesting a CPAP mask from [facility] can we send order?  Patient Instructions by Deneise Lever, MD at 04/27/2018 11:00 AM  Author: Deneise Lever, MD Author Type: Physician Filed: 04/27/2018 11:45 AM  Note Status: Signed Cosign: Cosign Not Required Encounter Date: 04/27/2018  Editor: Deneise Lever, MD (Physician)    Order- [facility]  Please replace old CPAP machine, continue auto 10-20, mask of choice, humidifier, supplies, Airview    Please add chin strap  Order- [facility]. Consider nasal pillows with chin strap  Please call as needed    Instructions      Return in about 3 months (around 07/27/2018).  Order- [facility]  Please replace old CPAP machine, continue auto 10-20, mask of choice, humidifier, supplies, Airview    Please add chin strap  Order- [facility]. Consider nasal pillows with chin strap  Please call as needed       After Visit Summary (Printed 04/27/2018)

## 2018-09-22 DIAGNOSIS — E119 Type 2 diabetes mellitus without complications: Secondary | ICD-10-CM | POA: Diagnosis not present

## 2018-09-22 DIAGNOSIS — Z6841 Body Mass Index (BMI) 40.0 and over, adult: Secondary | ICD-10-CM | POA: Diagnosis not present

## 2018-10-01 ENCOUNTER — Ambulatory Visit (INDEPENDENT_AMBULATORY_CARE_PROVIDER_SITE_OTHER): Payer: BLUE CROSS/BLUE SHIELD | Admitting: Licensed Clinical Social Worker

## 2018-10-01 DIAGNOSIS — F3341 Major depressive disorder, recurrent, in partial remission: Secondary | ICD-10-CM

## 2018-10-06 DIAGNOSIS — G4733 Obstructive sleep apnea (adult) (pediatric): Secondary | ICD-10-CM | POA: Diagnosis not present

## 2018-10-14 ENCOUNTER — Ambulatory Visit: Payer: BLUE CROSS/BLUE SHIELD | Admitting: Licensed Clinical Social Worker

## 2018-10-16 DIAGNOSIS — G4733 Obstructive sleep apnea (adult) (pediatric): Secondary | ICD-10-CM | POA: Diagnosis not present

## 2018-10-20 DIAGNOSIS — M170 Bilateral primary osteoarthritis of knee: Secondary | ICD-10-CM | POA: Diagnosis not present

## 2018-10-23 DIAGNOSIS — R1084 Generalized abdominal pain: Secondary | ICD-10-CM | POA: Diagnosis not present

## 2018-10-23 DIAGNOSIS — M545 Low back pain: Secondary | ICD-10-CM | POA: Diagnosis not present

## 2018-10-23 DIAGNOSIS — R58 Hemorrhage, not elsewhere classified: Secondary | ICD-10-CM | POA: Diagnosis not present

## 2018-10-23 DIAGNOSIS — F329 Major depressive disorder, single episode, unspecified: Secondary | ICD-10-CM | POA: Diagnosis not present

## 2018-10-27 ENCOUNTER — Ambulatory Visit (INDEPENDENT_AMBULATORY_CARE_PROVIDER_SITE_OTHER): Payer: BC Managed Care – PPO | Admitting: Licensed Clinical Social Worker

## 2018-10-27 DIAGNOSIS — F3341 Major depressive disorder, recurrent, in partial remission: Secondary | ICD-10-CM | POA: Diagnosis not present

## 2018-11-12 ENCOUNTER — Ambulatory Visit (INDEPENDENT_AMBULATORY_CARE_PROVIDER_SITE_OTHER): Payer: BC Managed Care – PPO | Admitting: Licensed Clinical Social Worker

## 2018-11-12 DIAGNOSIS — F3341 Major depressive disorder, recurrent, in partial remission: Secondary | ICD-10-CM | POA: Diagnosis not present

## 2018-11-15 DIAGNOSIS — G4733 Obstructive sleep apnea (adult) (pediatric): Secondary | ICD-10-CM | POA: Diagnosis not present

## 2018-12-02 ENCOUNTER — Ambulatory Visit (INDEPENDENT_AMBULATORY_CARE_PROVIDER_SITE_OTHER): Payer: BC Managed Care – PPO | Admitting: Licensed Clinical Social Worker

## 2018-12-02 DIAGNOSIS — F3341 Major depressive disorder, recurrent, in partial remission: Secondary | ICD-10-CM | POA: Diagnosis not present

## 2018-12-03 ENCOUNTER — Other Ambulatory Visit: Payer: Self-pay | Admitting: Orthopedic Surgery

## 2018-12-03 DIAGNOSIS — S83241A Other tear of medial meniscus, current injury, right knee, initial encounter: Secondary | ICD-10-CM | POA: Diagnosis not present

## 2018-12-03 DIAGNOSIS — S83242A Other tear of medial meniscus, current injury, left knee, initial encounter: Secondary | ICD-10-CM | POA: Diagnosis not present

## 2018-12-03 DIAGNOSIS — M25561 Pain in right knee: Secondary | ICD-10-CM

## 2018-12-03 DIAGNOSIS — M25562 Pain in left knee: Secondary | ICD-10-CM

## 2018-12-15 DIAGNOSIS — E119 Type 2 diabetes mellitus without complications: Secondary | ICD-10-CM | POA: Diagnosis not present

## 2018-12-15 DIAGNOSIS — F329 Major depressive disorder, single episode, unspecified: Secondary | ICD-10-CM | POA: Diagnosis not present

## 2018-12-15 DIAGNOSIS — K760 Fatty (change of) liver, not elsewhere classified: Secondary | ICD-10-CM | POA: Diagnosis not present

## 2018-12-15 DIAGNOSIS — E782 Mixed hyperlipidemia: Secondary | ICD-10-CM | POA: Diagnosis not present

## 2018-12-16 ENCOUNTER — Other Ambulatory Visit: Payer: Self-pay

## 2018-12-16 ENCOUNTER — Ambulatory Visit (INDEPENDENT_AMBULATORY_CARE_PROVIDER_SITE_OTHER): Payer: BC Managed Care – PPO | Admitting: Family Medicine

## 2018-12-16 ENCOUNTER — Encounter (INDEPENDENT_AMBULATORY_CARE_PROVIDER_SITE_OTHER): Payer: Self-pay | Admitting: Family Medicine

## 2018-12-16 VITALS — BP 116/78 | HR 94 | Temp 98.3°F | Ht 66.0 in | Wt 295.0 lb

## 2018-12-16 DIAGNOSIS — Z1331 Encounter for screening for depression: Secondary | ICD-10-CM | POA: Diagnosis not present

## 2018-12-16 DIAGNOSIS — E1165 Type 2 diabetes mellitus with hyperglycemia: Secondary | ICD-10-CM | POA: Diagnosis not present

## 2018-12-16 DIAGNOSIS — Z9189 Other specified personal risk factors, not elsewhere classified: Secondary | ICD-10-CM | POA: Diagnosis not present

## 2018-12-16 DIAGNOSIS — G4733 Obstructive sleep apnea (adult) (pediatric): Secondary | ICD-10-CM | POA: Diagnosis not present

## 2018-12-16 DIAGNOSIS — R0602 Shortness of breath: Secondary | ICD-10-CM | POA: Diagnosis not present

## 2018-12-16 DIAGNOSIS — Z6841 Body Mass Index (BMI) 40.0 and over, adult: Secondary | ICD-10-CM

## 2018-12-16 DIAGNOSIS — R5383 Other fatigue: Secondary | ICD-10-CM | POA: Diagnosis not present

## 2018-12-16 DIAGNOSIS — Z0289 Encounter for other administrative examinations: Secondary | ICD-10-CM

## 2018-12-16 NOTE — Progress Notes (Signed)
Office: [phone]  /  Fax: [phone]   Dear Dr. Noemi Chapel,   Thank you for referring ALIA PARSLEY to our clinic. The following note includes my evaluation and treatment recommendations.  HPI:   Chief Complaint: OBESITY    HELIA HAESE has been referred by Elsie Saas, MD for consultation regarding her obesity and obesity related comorbidities. Falynn possibly needs a knee replacement. She previously saw Dr. Valetta Close in [place].    RHYLEIGH GRASSEL (MR# [id number]) is a 53 y.o. female who presents on 12/16/2018 for obesity evaluation and treatment. Current BMI is Body mass index is 47.61 kg/m.Marland Kitchen Ricci has been struggling with her weight for many years and has been unsuccessful in either losing weight, maintaining weight loss, or reaching her healthy weight goal.     Jasleen attended our information session and states she is currently in the action stage of change and ready to dedicate time achieving and maintaining a healthier weight. Trenna is interested in becoming our patient and working on intensive lifestyle modifications including (but not limited to) diet, exercise and weight loss.      Kenitha wakes up and has a big bowl; 1 cup blueberries and strawberries and 2 to 3 cups of cheerios with skim milk (full). She has an apple, crackers, pretzels for a snack (possibly feels hungry around 9:00 AM). Her lunch consists of a sandwich with spinach, tomato, 3 slices of deli meat, avocado and pepper; or homemade pizza (satisfied). She has peanut butter pretzels or fruit (hungry and habit) around 2:00 PM. Dinner consists of 3 to 4 ounces of chicken, 8 asparagus spears, 1/2 cup potato (feel full) and later she has fruit and pretzels.    Zanetta states her family eats meals together she thinks her family will eat healthier with  her she has been heavy for the past 15 to 20 years she started gaining weight with pregnancy her heaviest weight ever was 335 lbs. she has significant food cravings issues  she snacks  frequently in the evenings she has problems with excessive hunger  she frequently eats larger portions than normal  she has binge eating behaviors she struggles with emotional eating    Fatigue Kala feels her energy is lower than it should be. This has worsened with weight gain and has not worsened recently. Valley admits to daytime somnolence and she admits to waking up still tired. Patient has a diagnosis of obstructive sleep apnea which may contribute to fatigue. Patent has a history of symptoms of daytime fatigue and morning fatigue. Patient generally gets approximately 4 hours of sleep per night, and states they generally have restless sleep. Snoring is not present with CPAP use. Apneic episodes are present. Epworth Sleepiness Score is 8  EKG was ordered today which shows normal sinus rhythm at 87 BPM.  Dyspnea on exertion Aradia notes increasing shortness of breath with exercising and seems to be worsening over time with weight gain. She notes getting out of breath sooner with activity than she used to. This has not gotten worse recently. Marshe denies orthopnea. EKG was ordered today which shows normal sinus rhythm at 87 BPM.  Diabetes II with hyperglycemia, not on insulin Robinn has had a diagnosis of diabetes type II for at least ten years. She had Hgb A1c of 6.1 yesterday. Rayvon is due for an eye exam in October. She is on Victoza 1.8 mg and Metformin 1,000 mg twice daily. Natasia denies any hypoglycemic episodes. She has been working on intensive lifestyle modifications  including diet, exercise, and weight loss to help control her blood glucose levels.  OSA (obstructive sleep apnea) Laxmi has a diagnosis of sleep apnea and she wears CPAP daily. She is still only sleeping approximately four hours a day.  At risk for cardiovascular disease Izella is at a higher than average risk for cardiovascular disease due to obesity, diabetes and obstructive sleep apnea. She currently denies any chest pain.   Depression Screen Myrical's Food and Mood (modified PHQ-9) score was  Depression screen PHQ 2/9 12/16/2018  Decreased Interest 2  Down, Depressed, Hopeless 0  PHQ - 2 Score 2  Altered sleeping 2  Tired, decreased energy 2  Change in appetite 1  Feeling bad or failure about yourself  0  Trouble concentrating 1  Moving slowly or fidgety/restless 0  Suicidal thoughts 0  PHQ-9 Score 8  Difficult doing work/chores Not difficult at all    ASSESSMENT AND PLAN:  Other fatigue - Plan: EKG 12-Lead, VITAMIN D 25 Hydroxy (Vit-D Deficiency, Fractures), Vitamin B12, Folate, T3, T4, free, TSH  Shortness of breath on exertion - Plan: Lipid Panel With LDL/HDL Ratio  Type 2 diabetes mellitus with hyperglycemia, without long-term current use of insulin (HCC) - Plan: Insulin, random  OSA (obstructive sleep apnea)  Depression screening  At risk for heart disease  Class 3 severe obesity with serious comorbidity and body mass index (BMI) of 45.0 to 49.9 in adult, unspecified obesity type (HCC)  PLAN:  Fatigue Everette was informed that her fatigue may be related to obesity, depression or many other causes. EKG, indirect calorimetry and labs will be ordered, and in the meanwhile Lucilla has agreed to work on diet, exercise and weight loss to help with fatigue. Proper sleep hygiene was discussed including the need for 7-8 hours of quality sleep each night.   Dyspnea on exertion Zakya's shortness of breath appears to be obesity related and exercise induced. She has agreed to work on weight loss and gradually increase exercise to treat her exercise induced shortness of breath. If Fifi follows our instructions and loses weight without improvement of her shortness of breath, we will plan to refer to pulmonology. EKG, indirect calorimetry and labs will be ordered today and we will monitor this condition regularly. Jenelle agrees to this plan.  Diabetes II with hyperglycemia, not on insulin Cami has been given  extensive diabetes education by myself today including ideal fasting and post-prandial blood glucose readings, individual ideal Hgb A1c goals and hypoglycemia prevention. We discussed the importance of good blood sugar control to decrease the likelihood of diabetic complications such as nephropathy, neuropathy, limb loss, blindness, coronary artery disease, and death. We discussed the importance of intensive lifestyle modification including diet, exercise and weight loss as the first line treatment for diabetes. We will check insulin level today and Citlalli will continue her diabetes medications and will follow up at the agreed upon time.  OSA (obstructive sleep apnea) Ashawna will continue wearing CPAP and she will follow up with our clinic in 2 weeks.  Cardiovascular risk counseling Duha was given extended (15 minutes) coronary artery disease prevention counseling today. She is 53 y.o. female and has risk factors for heart disease including obesity, diabetes and obstructive sleep apnea. We discussed intensive lifestyle modifications today with an emphasis on specific weight loss instructions and strategies. Pt was also informed of the importance of increasing exercise and decreasing saturated fats to help prevent heart disease.  Depression Screen Shaima had a mildly positive depression screening. Depression is  commonly associated with obesity and often results in emotional eating behaviors. We will monitor this closely and work on CBT to help improve the non-hunger eating patterns. Referral to Psychology may be required if no improvement is seen as she continues in our clinic.  Obesity Staysha is currently in the action stage of change and her goal is to continue with weight loss efforts. I recommend Jennifer begin the structured treatment plan as follows:  She has agreed to follow the category Grayson has been instructed to eventually work up to a goal of 150 minutes of combined cardio and strengthening  exercise per week for weight loss and overall health benefits. We discussed the following Behavioral Modification Strategies today: planning for success, keeping healthy foods in the home, increasing lean protein intake, increasing vegetables and work on meal planning and easy cooking plans   She was informed of the importance of frequent follow up visits to maximize her success with intensive lifestyle modifications for her multiple health conditions. She was informed we would discuss her lab results at her next visit unless there is a critical issue that needs to be addressed sooner. Lilyrose agreed to keep her next visit at the agreed upon time to discuss these results.  ALLERGIES: Allergies  Allergen Reactions  . Sulfa Antibiotics Diarrhea, Nausea And Vomiting and Rash    MEDICATIONS: Current Outpatient Medications on File Prior to Visit  Medication Sig Dispense Refill  . buPROPion (WELLBUTRIN XL) 150 MG 24 hr tablet 300 mg.     . dexlansoprazole (DEXILANT) 60 MG capsule Take 1 capsule by mouth at bedtime.     . docusate sodium (COLACE) 100 MG capsule Take 100-200 mg by mouth daily.    Marland Kitchen escitalopram (LEXAPRO) 20 MG tablet Take 20 mg by mouth daily.    Marland Kitchen ibuprofen (ADVIL) 200 MG tablet Take 200 mg by mouth every 6 (six) hours as needed.    . Liraglutide (VICTOZA) 18 MG/3ML SOPN Inject 1.8 mg into the skin daily.    . metFORMIN (GLUCOPHAGE) 1000 MG tablet Take 1,000 mg by mouth 2 (two) times daily.    . Venlafaxine HCl 150 MG TB24 Take 150 mg by mouth.     No current facility-administered medications on file prior to visit.     PAST MEDICAL HISTORY: Past Medical History:  Diagnosis Date  . Anemia    iron deficiency  . Anxiety   . Arthritis    general -coccyx area, elbow  . Back pain   . Complication of anesthesia   . Diabetes mellitus without complication ([place])   . Diverticulosis   . Fatty liver   . GERD (gastroesophageal reflux disease)   . Joint pain   . Knee pain   .  Obesity   . PONV (postoperative nausea and vomiting)    drop in blood pressure(spinal anesthesia)  . Sleep apnea    cpap used "automatic"    PAST SURGICAL HISTORY: Past Surgical History:  Procedure Laterality Date  . ANAL FISSURE REPAIR     '07  . BREAST REDUCTION SURGERY     '88  . CESAREAN SECTION     x2  . COLONOSCOPY WITH PROPOFOL N/A 02/23/2015   Procedure: COLONOSCOPY WITH PROPOFOL;  Surgeon: Juanita Craver, MD;  Location: [facility];  Service: Endoscopy;  Laterality: N/A;  . ESOPHAGOGASTRODUODENOSCOPY (EGD) WITH PROPOFOL N/A 07/03/2017   Procedure: ESOPHAGOGASTRODUODENOSCOPY (EGD) WITH PROPOFOL;  Surgeon: Juanita Craver, MD;  Location: [facility];  Service: Endoscopy;  Laterality: N/A;  .  GALLBLADDER SURGERY  08/2017  . KNEE ARTHROSCOPY W/ ACL RECONSTRUCTION Right    remains with a ? tear.  Marland Kitchen REDUCTION MAMMAPLASTY Bilateral 1988  . TONSILLECTOMY AND ADENOIDECTOMY     "Adenoids only"    SOCIAL HISTORY: Social History   Tobacco Use  . Smoking status: Never Smoker  . Smokeless tobacco: Never Used  Substance Use Topics  . Alcohol use: No  . Drug use: No    FAMILY HISTORY: Family History  Adopted: Yes    ROS: Review of Systems  Constitutional: Positive for malaise/fatigue.  Eyes:       Positive for Wear Glasses (prn)  Respiratory: Positive for shortness of breath.   Cardiovascular: Negative for chest pain.       Positive for Shortness of Breath with Activity  Gastrointestinal: Positive for heartburn.  Musculoskeletal: Positive for back pain.  Neurological: Positive for headaches.  Endo/Heme/Allergies: Bruises/bleeds easily ( bruising).       Negative for hypoglycemia  Psychiatric/Behavioral: The patient has insomnia.     PHYSICAL EXAM: Blood pressure 116/78, pulse 94, temperature 98.3 F (36.8 C), temperature source Oral, height 5\' 6"  (1.676 m), weight 295 lb (133.8 kg), SpO2 96 %. Body mass index is 47.61 kg/m. Physical Exam Vitals signs reviewed.   Constitutional:      Appearance: Normal appearance. She is well-developed. She is obese.  HENT:     Head: Normocephalic and atraumatic.     Nose: Nose normal.  Eyes:     General: No scleral icterus.    Extraocular Movements: Extraocular movements intact.  Neck:     Musculoskeletal: Normal range of motion and neck supple.     Thyroid: No thyromegaly.  Cardiovascular:     Rate and Rhythm: Normal rate and regular rhythm.  Pulmonary:     Effort: Pulmonary effort is normal. No respiratory distress.  Abdominal:     Palpations: Abdomen is soft.     Tenderness: There is no abdominal tenderness.  Musculoskeletal: Normal range of motion.     Comments: Range of Motion normal in all 4 extremities  Skin:    General: Skin is warm and dry.  Neurological:     Mental Status: She is alert and oriented to person, place, and time.     Coordination: Coordination normal.  Psychiatric:        Mood and Affect: Mood normal.        Behavior: Behavior normal.     RECENT LABS AND TESTS: BMET    Component Value Date/Time   NA 138 12/02/2017 1619   K 4.7 12/02/2017 1619   CL 103 12/02/2017 1619   CO2 22 12/02/2017 1619   GLUCOSE 131 (H) 12/02/2017 1619   BUN 19 12/02/2017 1619   CREATININE 0.81 12/02/2017 1619   CALCIUM 10.2 12/02/2017 1619   GFRNONAA >60 12/02/2017 1619   GFRAA >60 12/02/2017 1619   No results found for: HGBA1C No results found for: INSULIN CBC    Component Value Date/Time   WBC 7.9 12/02/2017 1619   RBC 5.57 (H) 12/02/2017 1619   HGB 13.9 12/02/2017 1619   HCT 44.1 12/02/2017 1619   PLT 321 12/02/2017 1619   MCV 79.2 12/02/2017 1619   MCH 25.0 (L) 12/02/2017 1619   MCHC 31.5 12/02/2017 1619   RDW 14.3 12/02/2017 1619   LYMPHSABS 1.8 04/29/2009 1746   MONOABS 0.6 04/29/2009 1746   EOSABS 0.1 04/29/2009 1746   BASOSABS 0.1 04/29/2009 1746   Iron/TIBC/Ferritin/ %Sat No results found for: IRON,  TIBC, FERRITIN, IRONPCTSAT Lipid Panel  No results found for: CHOL,  TRIG, HDL, CHOLHDL, VLDL, LDLCALC, LDLDIRECT Hepatic Function Panel     Component Value Date/Time   PROT 8.4 (H) 12/02/2017 1619   ALBUMIN 4.6 12/02/2017 1619   AST 33 12/02/2017 1619   ALT 26 12/02/2017 1619   ALKPHOS 71 12/02/2017 1619   BILITOT 0.9 12/02/2017 1619   No results found for: TSH  ECG  shows NSR with a rate of 87 BPM INDIRECT CALORIMETER done today shows a VO2 of 394 and a REE of 2742.  Her calculated basal metabolic rate is 4166 thus her basal metabolic rate is better than expected.       OBESITY BEHAVIORAL INTERVENTION VISIT  Today's visit was # 1   Starting weight: 295 lbs Starting date: 12/16/2018 Today's weight : 295 lbs  Today's date: 12/16/2018 Total lbs lost to date: 0    12/16/2018  Height 5\' 6"  (1.676 m)  Weight 295 lb (133.8 kg)  BMI (Calculated) 47.64  BLOOD PRESSURE - SYSTOLIC 063  BLOOD PRESSURE - DIASTOLIC 78  Waist Measurement  53 inches   Body Fat % 47.8 %  Total Body Water (lbs) 97.8 lbs  RMR 2742     ASK: We discussed the diagnosis of obesity with Zelphia Cairo today and Shonette agreed to give [place] permission to discuss obesity behavioral modification therapy today.  ASSESS: Penina has the diagnosis of obesity and her BMI today is 47.64 Samaa is in the action stage of change   ADVISE: Jesseca was educated on the multiple health risks of obesity as well as the benefit of weight loss to improve her health. She was advised of the need for long term treatment and the importance of lifestyle modifications to improve her current health and to decrease her risk of future health problems.  AGREE: Multiple dietary modification options and treatment options were discussed and  Shevelle agreed to follow the recommendations documented in the above note.  ARRANGE: Demaya was educated on the importance of frequent visits to treat obesity as outlined per CMS and USPSTF guidelines and agreed to schedule her next follow up appointment today.  I, Doreene Nest, am  acting as transcriptionist for Eber Jones, MD   I have reviewed the above documentation for accuracy and completeness, and I agree with the above. - Ilene Qua, MD

## 2018-12-17 LAB — LIPID PANEL WITH LDL/HDL RATIO
Cholesterol, Total: 168 mg/dL (ref 100–199)
HDL: 54 mg/dL (ref >39)
LDL Calculated: 94 mg/dL (ref 0–99)
LDl/HDL Ratio: 1.7 ratio (ref 0.0–3.2)
Triglycerides: 102 mg/dL (ref 0–149)
VLDL Cholesterol Cal: 20 mg/dL (ref 5–40)

## 2018-12-17 LAB — T3: T3, Total: 108 ng/dL (ref 71–180)

## 2018-12-17 LAB — VITAMIN D 25 HYDROXY (VIT D DEFICIENCY, FRACTURES): Vit D, 25-Hydroxy: 23.3 ng/mL — ABNORMAL LOW (ref 30.0–100.0)

## 2018-12-17 LAB — FOLATE: Folate: 6.1 ng/mL (ref >3.0)

## 2018-12-17 LAB — TSH: TSH: 1.36 u[IU]/mL (ref 0.450–4.500)

## 2018-12-17 LAB — INSULIN, RANDOM: INSULIN: 15.1 u[IU]/mL (ref 2.6–24.9)

## 2018-12-17 LAB — T4, FREE: Free T4: 1.03 ng/dL (ref 0.82–1.77)

## 2018-12-17 LAB — VITAMIN B12: Vitamin B-12: 348 pg/mL (ref 232–1245)

## 2018-12-21 NOTE — Progress Notes (Signed)
Office: [phone]  /  Fax: [phone]    Date: December 22, 2018   Appointment Start Time: 3:00pm Duration: 47 minutes Provider: Glennie Isle, Psy.D. Type of Session: Intake for Individual Therapy  Location of Patient: Home Location of Provider: [facility] Type of Contact: Telepsychological Visit via [employer] WebEx  Informed Consent: Prior to proceeding with today's appointment, two pieces of identifying information were obtained from Thomas to verify identity. In addition, Jai's physical location at the time of this appointment was obtained. Ronnika reported she was at home and provided the address. In the event of technical difficulties, Kyiah shared a phone number she could be reached at. Vern and this provider participated in today's telepsychological service. Also, Davis denied anyone else being present in the room or on the WebEx appointment.   The provider's role was explained to Zelphia Cairo. The provider reviewed and discussed issues of confidentiality, privacy, and limits therein (e.g., reporting obligations). In addition to verbal informed consent, written informed consent for psychological services was obtained from [place] prior to the initial intake interview. Written consent included information concerning the practice, financial arrangements, and confidentiality and patients' rights. Since the clinic is not a 24/7 crisis center, mental health emergency resources were shared, and the provider explained MyChart, e-mail, voicemail, and/or other messaging systems should be utilized only for non-emergency reasons. This provider also explained that information obtained during appointments will be placed in [place] record in a confidential manner and relevant information will be shared with other providers at [facility] that she meets with for coordination of care. Lelar verbally acknowledged understanding of the aforementioned, and agreed to use mental  health emergency resources discussed if needed. Moreover, Bre agreed information may be shared with other [facility] providers as needed for coordination of care. By signing the service agreement document, Erin provided written consent for coordination of care.   Prior to initiating telepsychological services, Luana was provided with an informed consent document, which included the development of a safety plan (i.e., an emergency contact and emergency resources) in the event of an emergency/crisis. Jodee expressed understanding of the rationale of the safety plan and provided consent for this provider to reach out to her emergency contact in the event of an emergency/crisis. Kalia returned the completed consent form prior to today's appointment. This provider verbally reviewed the consent form during today's appointment prior to proceeding with the appointment. Blimie verbally acknowledged understanding that she is ultimately responsible for understanding her insurance benefits as it relates to reimbursement of telepsychological and in-person services. This provider also reviewed confidentiality, as it relates to telepsychological services, as well as the rationale for telepsychological services. More specifically, this provider's clinic is limiting in-person visits due to COVID-19. Therapeutic services will resume to in-person appointments once deemed appropriate. Khiana expressed understanding regarding the rationale for telepsychological services. In addition, this provider explained the telepsychological services informed consent document would be considered an addendum to the initial consent document/service agreement. Jamira verbally consented to proceed.   Chief Complaint/HPI: Addis was referred by Dr. Ilene Qua. During the initial appointment with Dr. Ilene Qua at [facility] on December 16, 2018, Leandria reported experiencing the following: significant food cravings  issues , snacking frequently in the evenings, frequently eating larger portions than normal , binge eating behaviors, struggling with emotional eating and having problems with excessive hunger.   During today's appointment, Sakura was verbally administered a questionnaire assessing various behaviors related  to emotional eating. Lanett endorsed the following: use food to help you cope with emotional situations, find food is comforting to you, overeat when you are angry or upset and overeat when you are worried about something. She shared she craves pasta, potatoes, and bread. Jerika shared in 2018 her father "went into hospice" and he was diagnosed with COPD. He passed away 4 months later. Shequita believes it was around that time she began engaging in emotional eating. Lakiah further added her brother was murdered 10 months later by an unknown perpetrator. Currently, Bexlee described the frequency of emotional eating as "once a week." In addition, Alaysiah denied a history of binge eating, but acknowledged eating larger portions at times. Mesa denied a history of restricting food intake, purging and engagement in other compensatory strategies, and has never been diagnosed with an eating disorder. She also denied a history of treatment for emotional eating. Moreover, Keiley indicated grief triggers emotional eating, whereas the support of her family and staying busy makes emotional eating better. Furthermore, Shama denied other problems of concern.    Mental Status Examination:  Appearance: neat Behavior: cooperative Mood: euthymic Affect: mood congruent Speech: normal in rate, volume, and tone Eye Contact: appropriate Psychomotor Activity: appropriate Thought Process: linear, logical, and goal directed  Content/Perceptual Disturbances: denies suicidal and homicidal ideation, plan, and intent and no hallucinations, delusions, bizarre thinking or behavior reported or observed Orientation: time, person, place and purpose of  appointment Cognition/Sensorium: memory, attention, language, and fund of knowledge intact  Insight: good Judgment: good  Family & Psychosocial History: Royce reported she is married and has two children (ages 29 and 83). She indicated she is currently not working. 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 Moreover, Alura stated she resides with her husband and her son (age 17).   Medical History:  Past Medical History:  Diagnosis Date   Anemia    iron deficiency   Anxiety    Arthritis    general -coccyx area, elbow   Back pain    Complication of anesthesia    Diabetes mellitus without complication (HCC)    Diverticulosis    Fatty liver    GERD (gastroesophageal reflux disease)    Joint pain    Knee pain    Obesity    PONV (postoperative nausea and vomiting)    drop in blood pressure(spinal anesthesia)   Sleep apnea    cpap used "automatic"   Past Surgical History:  Procedure Laterality Date   ANAL FISSURE REPAIR     '07   BREAST REDUCTION SURGERY     '88   CESAREAN SECTION     x2   COLONOSCOPY WITH PROPOFOL N/A 02/23/2015   Procedure: COLONOSCOPY WITH PROPOFOL;  Surgeon: Juanita Craver, MD;  Location: [facility];  Service: Endoscopy;  Laterality: N/A;   ESOPHAGOGASTRODUODENOSCOPY (EGD) WITH PROPOFOL N/A 07/03/2017   Procedure: ESOPHAGOGASTRODUODENOSCOPY (EGD) WITH PROPOFOL;  Surgeon: Juanita Craver, MD;  Location: [facility];  Service: Endoscopy;  Laterality: N/A;   GALLBLADDER SURGERY  08/2017   KNEE ARTHROSCOPY W/ ACL RECONSTRUCTION Right    remains with a ? tear.   REDUCTION MAMMAPLASTY Bilateral 1988   TONSILLECTOMY AND ADENOIDECTOMY     "Adenoids only"   Current Outpatient Medications on File Prior to Visit  Medication Sig Dispense Refill   buPROPion (WELLBUTRIN XL) 150 MG 24 hr tablet 300 mg.  dexlansoprazole (DEXILANT) 60 MG capsule Take 1 capsule by mouth at bedtime.      docusate sodium (COLACE) 100 MG capsule Take 100-200 mg by mouth daily.     escitalopram (LEXAPRO) 20 MG tablet Take 20 mg by mouth daily.     ibuprofen (ADVIL) 200 MG tablet Take 200 mg by mouth every 6 (six) hours as needed.     Liraglutide (VICTOZA) 18 MG/3ML SOPN Inject 1.8 mg into the skin daily.     metFORMIN (GLUCOPHAGE) 1000 MG tablet Take 1,000 mg by mouth 2 (two) times daily.     Venlafaxine HCl 150 MG TB24 Take 150 mg by mouth.     No current facility-administered medications on file prior to visit.   Rutha denied a history of head injuries and loss of consciousness.    Mental Health History: Emileigh stated she attended therapy through hospice for two appointments after her father passed away in 07/21/16. Her current therapist is [name], LCSW with [facility]. She began working with her around 07-21-2017. The focus of treatment with [name] is "trying to heal from the loss." She stated treatment does not focus on eating. Linsay shared she meets with [name] approximately once a month. Their next appointment is scheduled for "the end of this month or early September." Jhene indicated she would inform [name] that she is meeting with this provider and was receptive to completing an authorization form for coordination of care.  Santina denied a history of hospitalizations for psychiatric concerns, and has never met with a psychiatrist. Natashia reported she is currently prescribed Effexor and Wellbutrin by her PCP. Notably, Aleksia shared she is no longer taking Lexapro. Xee denied a family history of mental health related concerns. She added, "I was adopted." Priscila denied a trauma history, including psychological, physical  and sexual abuse, as well as neglect.   Korri described her typical mood as "usually a happy perosn." Aside from concerns noted above and endorsed on the PHQ-9 and GAD-7, Terrin reported  experiencing crying spells and worry thoughts about her mother's well-being. Eyonna denied current alcohol use. She denied tobacco use. She denied illicit/recreational substance use. Regarding caffeine intake, Arlethia reported consuming 2, 12oz diet Cokes daily. Furthermore, Byrdie denied experiencing the following: hopelessness, hallucinations and delusions, paranoia, mania and decreased motivation. She also denied history of and current suicidal ideation, plan, and intent; history of and current homicidal ideation, plan, and intent; and history of and current engagement in self-harm. She added, "I love myself."   The following strengths were reported by [place]: patient, kind, and caring. The following strengths were observed by this provider: ability to express thoughts and feelings during the therapeutic session, ability to establish and benefit from a therapeutic relationship, ability to learn and practice coping skills, willingness to work toward established goal(s) with the clinic and ability to engage in reciprocal conversation.  Legal History: Chemeka denied a history of legal involvement.   Structured Assessment Results: The Patient Health Questionnaire-9 (PHQ-9) is a self-report measure that assesses symptoms and severity of depression over the course of the last two weeks. Marielis obtained a score of 6 suggesting mild depression. Elica finds the endorsed symptoms to be somewhat difficult. Little interest or pleasure in doing things 0  Feeling down, depressed, or hopeless 0  Trouble falling or staying asleep, or sleeping too much 3  Feeling tired or having little energy 3  Poor appetite or overeating 0  Feeling bad about yourself --- or that  you are a failure or have let yourself or your family down 0  Trouble concentrating on things, such as reading the newspaper or watching television 0  Moving or speaking so slowly that other people could have noticed? Or the opposite --- being so fidgety or restless that  you have been moving around a lot more than usual 0  Thoughts that you would be better off dead or hurting yourself in some way 0  PHQ-9 Score 6    The Generalized Anxiety Disorder-7 (GAD-7) is a brief self-report measure that assesses symptoms of anxiety over the course of the last two weeks. Gemini obtained a score of 0. Feeling nervous, anxious, on edge 0  Not being able to stop or control worrying 0  Worrying too much about different things 0  Trouble relaxing 0  Being so restless that it's hard to sit still 0  Becoming easily annoyed or irritable 0  Feeling afraid as if something awful might happen 0  GAD-7 Score 0   Interventions: A chart review was conducted prior to the clinical intake interview. The PHQ-9, and GAD-7 were verbally administered as well as a Mood and Food questionnaire to assess various behaviors related to emotional eating. Throughout session, empathic reflections and validation was provided. Continuing treatment with this provider was discussed and a treatment goal was established. Psychoeducation regarding emotional versus physical hunger was provided. Chakita was sent a handout via e-mail to utilize between now and the next appointment to increase awareness of hunger patterns and subsequent eating. Virdia provided verbal consent during today's appointment for this provider to send the handout via e-mail.   Provisional DSM-5 Diagnosis: 311 (F32.8) Other Specified Depressive Disorder, Emotional Eating Behaviors  Plan: Chantilly appears able and willing to participate as evidenced by collaboration on a treatment goal, engagement in reciprocal conversation, and asking questions as needed for clarification. Based on Ofilia's request and this provider's availability, the next appointment will be scheduled in three weeks, which will be via [employer]. The following treatment goal was established: decrease emotional eating. Once this provider's office resumes in-person appointments and it is  deemed appropriate, Reeve will be notified. For the aforementioned goal, Kaleeya can benefit from individual therapy sessions that are brief in duration for approximately four to six sessions. The treatment modality will be individual therapeutic services, including an eclectic therapeutic approach utilizing techniques from Cognitive Behavioral Therapy, Patient Centered Therapy, Dialectical Behavior Therapy, Acceptance and Commitment Therapy, Interpersonal Therapy, and Cognitive Restructuring. Therapeutic approach will include various interventions as appropriate, such as validation, support, mindfulness, thought defusion, reframing, psychoeducation, values assessment, and role playing. This provider will regularly review the treatment plan and medical chart to keep informed of status changes. Flavia expressed understanding and agreement with the initial treatment plan of care.

## 2018-12-22 ENCOUNTER — Other Ambulatory Visit: Payer: Self-pay

## 2018-12-22 ENCOUNTER — Ambulatory Visit (INDEPENDENT_AMBULATORY_CARE_PROVIDER_SITE_OTHER): Payer: BC Managed Care – PPO | Admitting: Psychology

## 2018-12-22 DIAGNOSIS — F3289 Other specified depressive episodes: Secondary | ICD-10-CM

## 2018-12-23 DIAGNOSIS — R160 Hepatomegaly, not elsewhere classified: Secondary | ICD-10-CM | POA: Diagnosis not present

## 2018-12-23 DIAGNOSIS — K760 Fatty (change of) liver, not elsewhere classified: Secondary | ICD-10-CM | POA: Diagnosis not present

## 2018-12-23 DIAGNOSIS — Z9049 Acquired absence of other specified parts of digestive tract: Secondary | ICD-10-CM | POA: Diagnosis not present

## 2018-12-26 ENCOUNTER — Ambulatory Visit
Admission: RE | Admit: 2018-12-26 | Discharge: 2018-12-26 | Disposition: A | Discharge status: Home / Self Care | Payer: BC Managed Care – PPO | Source: Ambulatory Visit | Attending: Orthopedic Surgery | Admitting: Orthopedic Surgery

## 2018-12-26 ENCOUNTER — Other Ambulatory Visit: Payer: Self-pay

## 2018-12-26 DIAGNOSIS — M25562 Pain in left knee: Secondary | ICD-10-CM

## 2018-12-26 DIAGNOSIS — M25561 Pain in right knee: Secondary | ICD-10-CM

## 2018-12-29 ENCOUNTER — Ambulatory Visit: Payer: BC Managed Care – PPO | Admitting: Licensed Clinical Social Worker

## 2018-12-30 ENCOUNTER — Other Ambulatory Visit: Payer: Self-pay

## 2018-12-30 ENCOUNTER — Ambulatory Visit (INDEPENDENT_AMBULATORY_CARE_PROVIDER_SITE_OTHER): Payer: BC Managed Care – PPO | Admitting: Family Medicine

## 2018-12-30 VITALS — BP 138/83 | HR 87 | Temp 98.2°F | Ht 66.0 in | Wt 308.0 lb

## 2018-12-30 DIAGNOSIS — E559 Vitamin D deficiency, unspecified: Secondary | ICD-10-CM | POA: Diagnosis not present

## 2018-12-30 DIAGNOSIS — Z6841 Body Mass Index (BMI) 40.0 and over, adult: Secondary | ICD-10-CM

## 2018-12-30 DIAGNOSIS — E1165 Type 2 diabetes mellitus with hyperglycemia: Secondary | ICD-10-CM

## 2018-12-30 DIAGNOSIS — Z9189 Other specified personal risk factors, not elsewhere classified: Secondary | ICD-10-CM | POA: Diagnosis not present

## 2018-12-31 NOTE — Progress Notes (Signed)
Office: [phone]  /  Fax: [phone]   HPI:   Chief Complaint: OBESITY Karen [name] is here to discuss her progress with her obesity treatment plan. She is on the  follow the Category 4 plan and is following her eating plan approximately 75 % of the time. She states she is exercising by walking for 30-35 minutes 3 times per week. Karen [name] voices that the first week she followed the meal plan to a "T" but the second week she struggled to follow the plan at all given that mother got hurt and hospitalized. She denies orthopnea or dyspnea. She has noticed an increase in urine volume.  Her weight is (!) 308 lb (139.7 kg) today and has had a weight gained of 13 pounds over a period of 2 weeks since her last visit. She has lost 0 lbs since starting treatment with [place].  Diabetes II with hyperglycemia not on insulin  Karen [name] has a diagnosis of diabetes type II. Karen [name] states she does not check BGs currently and denies any hypoglycemic episodes. Last A1c was 6.1 2 weeks ago.  She has been working on intensive lifestyle modifications including diet, exercise, and weight loss to help control her blood glucose levels. She is currently on victoza and metformin.   Vitamin D deficiency Karen [name] has a diagnosis of vitamin D deficiency. She is currently taking vit D OTC and denies nausea, vomiting or muscle weakness. She reports fatigue.   At risk for osteopenia and osteoporosis Karen [name] is at higher risk of osteopenia and osteoporosis due to vitamin D deficiency.   ASSESSMENT AND PLAN:  Type 2 diabetes mellitus with hyperglycemia, without long-term current use of insulin (HCC) - Plan: atorvastatin (LIPITOR) 10 MG tablet  Vitamin D deficiency - Plan: Vitamin D, Ergocalciferol, (DRISDOL) 1.25 MG (50000 UT) CAPS capsule  At risk for osteoporosis  Class 3 severe obesity with serious comorbidity and body mass index (BMI) of 45.0 to 49.9 in adult, unspecified obesity type ([place])  PLAN: Diabetes II with hyperglycemia not on  insulin Karen [name] has been given extensive diabetes education by myself today including ideal fasting and post-prandial blood glucose readings, individual ideal HgA1c goals  and hypoglycemia prevention. We discussed the importance of good blood sugar control to decrease the likelihood of diabetic complications such as nephropathy, neuropathy, limb loss, blindness, coronary artery disease, and death. We discussed the importance of intensive lifestyle modification including diet, exercise and weight loss as the first line treatment for diabetes. She agrees to start checking BGs once a day and start Atorvastatin 10 mg daily #30 with no refills based on ASCVD guidelines.  Karen [name] agrees to continue her diabetes medications and will follow up at the agreed upon time.  Vitamin D Deficiency Karen [name] was informed that low vitamin D levels contributes to fatigue and are associated with obesity, breast, and colon cancer. She agrees to continue to take prescription Vit D @50 ,000 IU every week #4 with no refills and will follow up for routine testing of vitamin D, at least 2-3 times per year. She was informed of the risk of over-replacement of vitamin D and agrees to not increase her dose unless she discusses this with [place] first. Agrees to follow up with our clinic as directed.   At risk for osteopenia and osteoporosis Karen [name] was given extended  (15 minutes) osteoporosis prevention counseling today. Karen [name] is at risk for osteopenia and osteoporosis due to her vitamin D deficiency. She was encouraged to take her vitamin D and follow her higher calcium diet  and increase strengthening exercise to help strengthen her bones and decrease her risk of osteopenia and osteoporosis.  Obesity Karen [name] is currently in the action stage of change. As such, her goal is to continue with weight loss efforts She has agreed to follow the Category 4 plan Karen [name] has been instructed to work up to a goal of 150 minutes of combined cardio and strengthening  exercise per week for weight loss and overall health benefits. We discussed the following Behavioral Modification Strategies today:better snacking choices, keeping healthy foods in the home, planning for success, increasing lean protein intake, increasing vegetables and work on meal planning and easy cooking plans   Karen [name] has agreed to follow up with our clinic in 2 weeks. She was informed of the importance of frequent follow up visits to maximize her success with intensive lifestyle modifications for her multiple health conditions.  ALLERGIES: Allergies  Allergen Reactions  . Sulfa Antibiotics Diarrhea, Nausea And Vomiting and Rash    MEDICATIONS: Current Outpatient Medications on File Prior to Visit  Medication Sig Dispense Refill  . buPROPion (WELLBUTRIN XL) 150 MG 24 hr tablet 300 mg.     . dexlansoprazole (DEXILANT) 60 MG capsule Take 1 capsule by mouth at bedtime.     . docusate sodium (COLACE) 100 MG capsule Take 100-200 mg by mouth daily.    Marland Kitchen escitalopram (LEXAPRO) 20 MG tablet Take 20 mg by mouth daily.    Marland Kitchen ibuprofen (ADVIL) 200 MG tablet Take 200 mg by mouth every 6 (six) hours as needed.    . Liraglutide (VICTOZA) 18 MG/3ML SOPN Inject 1.8 mg into the skin daily.    . metFORMIN (GLUCOPHAGE) 1000 MG tablet Take 1,000 mg by mouth 2 (two) times daily.    . Venlafaxine HCl 150 MG TB24 Take 150 mg by mouth.     No current facility-administered medications on file prior to visit.     PAST MEDICAL HISTORY: Past Medical History:  Diagnosis Date  . Anemia    iron deficiency  . Anxiety   . Arthritis    general -coccyx area, elbow  . Back pain   . Complication of anesthesia   . Diabetes mellitus without complication ([place])   . Diverticulosis   . Fatty liver   . GERD (gastroesophageal reflux disease)   . Joint pain   . Knee pain   . Obesity   . PONV (postoperative nausea and vomiting)    drop in blood pressure(spinal anesthesia)  . Sleep apnea    cpap used "automatic"     PAST SURGICAL HISTORY: Past Surgical History:  Procedure Laterality Date  . ANAL FISSURE REPAIR     '07  . BREAST REDUCTION SURGERY     '88  . CESAREAN SECTION     x2  . COLONOSCOPY WITH PROPOFOL N/A 02/23/2015   Procedure: COLONOSCOPY WITH PROPOFOL;  Surgeon: Juanita Craver, MD;  Location: [facility];  Service: Endoscopy;  Laterality: N/A;  . ESOPHAGOGASTRODUODENOSCOPY (EGD) WITH PROPOFOL N/A 07/03/2017   Procedure: ESOPHAGOGASTRODUODENOSCOPY (EGD) WITH PROPOFOL;  Surgeon: Juanita Craver, MD;  Location: [facility];  Service: Endoscopy;  Laterality: N/A;  . GALLBLADDER SURGERY  08/2017  . KNEE ARTHROSCOPY W/ ACL RECONSTRUCTION Right    remains with a ? tear.  Marland Kitchen REDUCTION MAMMAPLASTY Bilateral 1988  . TONSILLECTOMY AND ADENOIDECTOMY     "Adenoids only"    SOCIAL HISTORY: Social History   Tobacco Use  . Smoking status: Never Smoker  . Smokeless tobacco: Never Used  Substance Use Topics  .  Alcohol use: No  . Drug use: No    FAMILY HISTORY: Family History  Adopted: Yes    ROS: Review of Systems  Constitutional: Positive for malaise/fatigue. Negative for weight loss.  Gastrointestinal: Negative for nausea and vomiting.  Musculoskeletal:       Negative for muscle weakness  Endo/Heme/Allergies:       Negative for hypoglcycemia     PHYSICAL EXAM: Blood pressure 138/83, pulse 87, temperature 98.2 F (36.8 C), temperature source Oral, height 5\' 6"  (1.676 m), weight (!) 308 lb (139.7 kg), SpO2 97 %. Body mass index is 49.71 kg/m. Physical Exam Vitals signs reviewed.  Constitutional:      Appearance: Normal appearance. She is obese.  HENT:     Head: Normocephalic.     Nose: Nose normal.  Neck:     Musculoskeletal: Normal range of motion.  Cardiovascular:     Rate and Rhythm: Normal rate.  Pulmonary:     Effort: Pulmonary effort is normal.  Musculoskeletal: Normal range of motion.  Skin:    General: Skin is warm and dry.  Neurological:     Mental Status: She  is alert and oriented to person, place, and time.  Psychiatric:        Mood and Affect: Mood normal.        Behavior: Behavior normal.     RECENT LABS AND TESTS: BMET    Component Value Date/Time   NA 138 12/02/2017 1619   K 4.7 12/02/2017 1619   CL 103 12/02/2017 1619   CO2 22 12/02/2017 1619   GLUCOSE 131 (H) 12/02/2017 1619   BUN 19 12/02/2017 1619   CREATININE 0.81 12/02/2017 1619   CALCIUM 10.2 12/02/2017 1619   GFRNONAA >60 12/02/2017 1619   GFRAA >60 12/02/2017 1619   No results found for: HGBA1C Lab Results  Component Value Date   INSULIN 15.1 12/16/2018   CBC    Component Value Date/Time   WBC 7.9 12/02/2017 1619   RBC 5.57 (H) 12/02/2017 1619   HGB 13.9 12/02/2017 1619   HCT 44.1 12/02/2017 1619   PLT 321 12/02/2017 1619   MCV 79.2 12/02/2017 1619   MCH 25.0 (L) 12/02/2017 1619   MCHC 31.5 12/02/2017 1619   RDW 14.3 12/02/2017 1619   LYMPHSABS 1.8 04/29/2009 1746   MONOABS 0.6 04/29/2009 1746   EOSABS 0.1 04/29/2009 1746   BASOSABS 0.1 04/29/2009 1746   Iron/TIBC/Ferritin/ %Sat No results found for: IRON, TIBC, FERRITIN, IRONPCTSAT Lipid Panel     Component Value Date/Time   CHOL 168 12/16/2018 1354   TRIG 102 12/16/2018 1354   HDL 54 12/16/2018 1354   LDLCALC 94 12/16/2018 1354   Hepatic Function Panel     Component Value Date/Time   PROT 8.4 (H) 12/02/2017 1619   ALBUMIN 4.6 12/02/2017 1619   AST 33 12/02/2017 1619   ALT 26 12/02/2017 1619   ALKPHOS 71 12/02/2017 1619   BILITOT 0.9 12/02/2017 1619      Component Value Date/Time   TSH 1.360 12/16/2018 1354     Ref. Range 12/16/2018 13:54  Vitamin D, 25-Hydroxy Latest Ref Range: 30.0 - 100.0 ng/mL 23.3 (L)     OBESITY BEHAVIORAL INTERVENTION VISIT  Today's visit was # 2   Starting weight: 295 lbs Starting date: 12/16/18 Today's weight : Weight: (!) 308 lb (139.7 kg)  Today's date: 12/30/18 Total lbs lost to date: 0 At least 15 minutes were spent on discussing the following  behavioral intervention visit.   ASK:  We discussed the diagnosis of obesity with Zelphia Cairo today and Stewart agreed to give [place] permission to discuss obesity behavioral modification therapy today.  ASSESS: Raysa has the diagnosis of obesity and her BMI today is 49.74 Graham is in the action stage of change   ADVISE: Shontrice was educated on the multiple health risks of obesity as well as the benefit of weight loss to improve her health. She was advised of the need for long term treatment and the importance of lifestyle modifications to improve her current health and to decrease her risk of future health problems.  AGREE: Multiple dietary modification options and treatment options were discussed and  Kanyia agreed to follow the recommendations documented in the above note.  ARRANGE: Moniya was educated on the importance of frequent visits to treat obesity as outlined per CMS and USPSTF guidelines and agreed to schedule her next follow up appointment today.  I, Renee Ramus, am acting as transcriptionist for Ilene Qua, MD   I have reviewed the above documentation for accuracy and completeness, and I agree with the above. - Ilene Qua, MD

## 2019-01-05 NOTE — Progress Notes (Signed)
Office: [phone]  /  Fax: [phone]    Date: January 12, 2019   Appointment Start Time: 10:09am Duration: 21 minutes Provider: Glennie Isle, Psy.D. Type of Session: Individual Therapy  Location of Patient: Home Location of Provider: [facility] Type of Contact: Telephone Call   Session Content: Mikelle is a 53 y.o. female presenting via a telephone cal for a follow-up appointment to address the previously established treatment goal of decreasing emotional eating. Of note, this provider called Ngozi at 10:02am as she did not present for the [facility] appointment. Assistance was provided; however, a connection was unable to be established on [place]'s end. Ingris provided consent to proceed via a telephone call and acknowledged understanding that only a land line to land line is a secure form of connection. As such, today's appointment was initiated 9 minutes late. Today's appointment was a telepsychological visit, as this provider's clinic is seeing a limited number of patients for in-person visits due to COVID-19. Therapeutic services will resume to in-person appointments once deemed appropriate. Rickayla expressed understanding regarding the rationale for telepsychological services, and provided verbal consent for today's appointment. Prior to proceeding with today's appointment, Azhane's physical location at the time of this appointment was obtained. Jalan reported she was at home and provided the address. Zoella and this provider participated in today's telepsychological service. Also, Janara denied anyone else being present in the room or on the call.  This provider conducted a brief check-in and verbally administered the PHQ-9 and GAD-7. Shukura shared things have been going well and she took her daughter to school; however, she noted her mother broke her knee cap. Raja acknowledged deviations from the meal plan; however, she stated she is back on the plan. She also shared having an increase  in "insight" to how her body is responding to the meal plan. In addition, Leniya recalled feeling stressed about her mother's fall resulting in emotional eating and deviations from the meal plan. Psychoeducation regarding triggers for emotional eating was provided. Maeby was provided a handout, and encouraged to utilize the handout between now and the next appointment to increase awareness of triggers and frequency. [name] agreed. This provider also discussed behavioral strategies for specific triggers, such as placing the utensil down when conversing to avoid mindless eating. Eimmy provided verbal consent during today's appointment for this provider to send the handout for triggers via e-mail. Of note, Cheramie stated she canceled her appointment with [name] due to her mother's fall and she is "doing very well." She noted a plan to schedule a follow-up appointment to discuss termination. Based on Danae's self-report, the frequency of appointments with this provider will be reduced. Shavannah was receptive to today's session as evidenced by openness to sharing, responsiveness to feedback, and willingness to identify triggers for emotional eating. She added, "I really do feel good about things."  Mental Status Examination:  Appearance: neat Behavior: cooperative Mood: euthymic Affect: mood congruent Speech: normal in rate, volume, and tone Eye Contact: appropriate Psychomotor Activity: appropriate Thought Process: linear, logical, and goal directed  Content/Perceptual Disturbances: no hallucinations, delusions, bizarre thinking or behavior reported or observed and no evidence of suicidal and homicidal ideation, plan, and intent Orientation: time, person, place and purpose of appointment Cognition/Sensorium: memory, attention, language, and fund of knowledge intact  Insight: good Judgment: good  Structured Assessment Results: The Patient Health Questionnaire-9 (PHQ-9) is a self-report measure that assesses  symptoms and severity of depression over the course of the last two weeks. [name]  obtained a score of 2 suggesting minimal depression. Jamieann finds the endorsed symptoms to be very difficult. Little interest or pleasure in doing things 0  Feeling down, depressed, or hopeless 0  Trouble falling or staying asleep, or sleeping too much 0  Feeling tired or having little energy 2  Poor appetite or overeating 0  Feeling bad about yourself --- or that you are a failure or have let yourself or your family down 0  Trouble concentrating on things, such as reading the newspaper or watching television 0  Moving or speaking so slowly that other people could have noticed? Or the opposite --- being so fidgety or restless that you have been moving around a lot more than usual 0  Thoughts that you would be better off dead or hurting yourself in some way 0  PHQ-9 Score 2    The Generalized Anxiety Disorder-7 (GAD-7) is a brief self-report measure that assesses symptoms of anxiety over the course of the last two weeks. Zakeria obtained a score of 0. Feeling nervous, anxious, on edge 0  Not being able to stop or control worrying 0  Worrying too much about different things 0  Trouble relaxing 0  Being so restless that it's hard to sit still 0  Becoming easily annoyed or irritable 0  Feeling afraid as if something awful might happen 0  GAD-7 Score 0   Interventions:  Conducted a brief chart review Verbal administration of PHQ-9 and GAD-7 for symptom monitoring Provided empathic reflections and validation Reviewed content from the previous session Psychoeducation provided regarding triggers for emotional eating Focused on rapport building Employed supportive psychotherapy interventions to facilitate reduced distress, and to improve coping skills with identified stressors  DSM-5 Diagnosis: 311 (F32.8) Other Specified Depressive Disorder, Emotional Eating Behaviors  Treatment Goal & Progress: During the initial  appointment with this provider, the following treatment goal was established: decrease emotional eating. Progress is limited, as Alois has just begun treatment with this provider; however, she is receptive to the interaction and interventions and rapport is being established.   Plan: Nili continues to appear able and willing to participate as evidenced by engagement in reciprocal conversation, and asking questions for clarification as appropriate. The next appointment will be scheduled in three weeks, which will be via [employer]. The next session will focus on the introduction of mindfulness.

## 2019-01-07 DIAGNOSIS — K760 Fatty (change of) liver, not elsewhere classified: Secondary | ICD-10-CM | POA: Diagnosis not present

## 2019-01-07 DIAGNOSIS — R160 Hepatomegaly, not elsewhere classified: Secondary | ICD-10-CM | POA: Diagnosis not present

## 2019-01-07 MED ORDER — ATORVASTATIN CALCIUM 10 MG PO TABS: 10.0000 mg | ORAL_TABLET | Freq: Every day | ORAL | 0 refills | Status: DC

## 2019-01-07 MED ORDER — VITAMIN D (ERGOCALCIFEROL) 1.25 MG (50000 UNIT) PO CAPS: 50000.0000 [IU] | ORAL_CAPSULE | ORAL | 0 refills | Status: DC

## 2019-01-12 ENCOUNTER — Ambulatory Visit (INDEPENDENT_AMBULATORY_CARE_PROVIDER_SITE_OTHER): Payer: BC Managed Care – PPO | Admitting: Psychology

## 2019-01-12 ENCOUNTER — Other Ambulatory Visit: Payer: Self-pay

## 2019-01-12 DIAGNOSIS — F3289 Other specified depressive episodes: Secondary | ICD-10-CM | POA: Diagnosis not present

## 2019-01-13 ENCOUNTER — Ambulatory Visit (INDEPENDENT_AMBULATORY_CARE_PROVIDER_SITE_OTHER): Payer: BC Managed Care – PPO | Admitting: Family Medicine

## 2019-01-13 VITALS — BP 100/59 | HR 87 | Temp 97.9°F | Ht 66.0 in | Wt 289.0 lb

## 2019-01-13 DIAGNOSIS — Z6841 Body Mass Index (BMI) 40.0 and over, adult: Secondary | ICD-10-CM

## 2019-01-13 DIAGNOSIS — Z9189 Other specified personal risk factors, not elsewhere classified: Secondary | ICD-10-CM | POA: Diagnosis not present

## 2019-01-13 DIAGNOSIS — E1165 Type 2 diabetes mellitus with hyperglycemia: Secondary | ICD-10-CM

## 2019-01-13 DIAGNOSIS — E559 Vitamin D deficiency, unspecified: Secondary | ICD-10-CM

## 2019-01-13 MED ORDER — VITAMIN D (ERGOCALCIFEROL) 1.25 MG (50000 UNIT) PO CAPS: 50000.0000 [IU] | ORAL_CAPSULE | ORAL | 0 refills | Status: DC

## 2019-01-13 MED ORDER — ATORVASTATIN CALCIUM 10 MG PO TABS: 10.0000 mg | ORAL_TABLET | Freq: Every day | ORAL | 0 refills | Status: DC

## 2019-01-13 NOTE — Progress Notes (Signed)
Office: [phone]  /  Fax: [phone]   HPI:   Chief Complaint: OBESITY Karen [name] is here to discuss her progress with her obesity treatment plan. She is on the Category 4 plan and is following her eating plan approximately 85-90 % of the time. She states she is walking 1 mile 3 times per week. Karen [name] is looking to ad different fruit in, especially watermelon and bananas. She is trying different preparations to keep herself interested. She is doing sugar free jello, fruit, string cheese, and [place] yogurt for snacks.  Her weight is 289 lb (131.1 kg) today and has had a weight loss of 19 pounds over a period of 2 weeks since her last visit. She has lost 6 lbs since starting treatment with [place].  Hyperlipidemia Karen [name] has hyperlipidemia and has been trying to improve her cholesterol levels with intensive lifestyle modification including a low saturated fat diet, exercise and weight loss. She notes she hasn't started her medication. She denies any chest pain, claudication or myalgias.  Diabetes II with Hyperglycemia Karen [name] has a diagnosis of diabetes type II. Joyell denies hypoglycemia. She is on Victoza 1.8 mg and metformin BID. She has been working on intensive lifestyle modifications including diet, exercise, and weight loss to help control her blood glucose levels.  Vitamin D Deficiency Karen [name] has a diagnosis of vitamin D deficiency. She is currently taking prescription Vit D. She notes fatigue and denies nausea, vomiting or muscle weakness.  At risk for osteopenia and osteoporosis Karen [name] is at higher risk of osteopenia and osteoporosis due to vitamin D deficiency.   ASSESSMENT AND PLAN:  Class 3 severe obesity with serious comorbidity and body mass index (BMI) of 45.0 to 49.9 in adult, unspecified obesity type (Karen [name])  Type 2 diabetes mellitus with hyperglycemia, without long-term current use of insulin (HCC) - Plan: atorvastatin (LIPITOR) 10 MG tablet  Vitamin D deficiency - Plan: Vitamin D,  Ergocalciferol, (DRISDOL) 1.25 MG (50000 UT) CAPS capsule  At risk for osteoporosis  PLAN:  Hyperlipidemia Karen [name] was informed of the American Heart Association Guidelines emphasizing intensive lifestyle modifications as the first line treatment for hyperlipidemia. We discussed many lifestyle modifications today in depth, and Karen [name] will continue to work on decreasing saturated fats such as fatty red meat, butter and many fried foods. She will also increase vegetables and lean protein in her diet and continue to work on exercise and weight loss efforts. Karen [name] agrees to continue taking Lipitor 10 mg PO daily #30 and we will refill for 1 month. Karen [name] agrees to follow up with our clinic in 2 weeks.  Diabetes II with Hyperglycemia Karen [name] has been given extensive diabetes education by myself today including ideal fasting and post-prandial blood glucose readings, individual ideal Hgb A1c goals and hypoglycemia prevention. We discussed the importance of good blood sugar control to decrease the likelihood of diabetic complications such as nephropathy, neuropathy, limb loss, blindness, coronary artery disease, and death. We discussed the importance of intensive lifestyle modification including diet, exercise and weight loss as the first line treatment for diabetes. Karen [name] agrees to continue her current diabetes medications, and she agrees to follow up with our clinic in 2 weeks.  Vitamin D Deficiency Karen [name] was informed that low vitamin D levels contributes to fatigue and are associated with obesity, breast, and colon cancer. Karen [name] agrees to continue taking prescription Vit D 50,000 IU every week #4 and we will refill for 1 month. She will follow up for routine testing of vitamin D, at least 2-3  times per year. She was informed of the risk of over-replacement of vitamin D and agrees to not increase her dose unless she discusses this with [place] first. Karen [name] agrees to follow up with our clinic in 2 weeks.  At risk for osteopenia  and osteoporosis Karen [name] was given extended (15 minutes) osteoporosis prevention counseling today. Karen [name] is at risk for osteopenia and osteoporsis due to her vitamin D deficiency. She was encouraged to take her vitamin D and follow her higher calcium diet and increase strengthening exercise to help strengthen her bones and decrease her risk of osteopenia and osteoporosis.  Obesity Karen [name] is currently in the action stage of change. As such, her goal is to continue with weight loss efforts She has agreed to follow the Category 4 plan Karen [name] has been instructed to work up to a goal of 150 minutes of combined cardio and strengthening exercise per week for weight loss and overall health benefits. We discussed the following Behavioral Modification Strategies today: increasing lean protein intake, increasing vegetables and work on meal planning and easy cooking plans, keeping healthy foods in the home, and planning for success   Karen [name] has agreed to follow up with our clinic in 2 weeks. She was informed of the importance of frequent follow up visits to maximize her success with intensive lifestyle modifications for her multiple health conditions.  ALLERGIES: Allergies  Allergen Reactions   Sulfa Antibiotics Diarrhea, Nausea And Vomiting and Rash    MEDICATIONS: Current Outpatient Medications on File Prior to Visit  Medication Sig Dispense Refill   buPROPion (WELLBUTRIN XL) 150 MG 24 hr tablet 300 mg.      dexlansoprazole (DEXILANT) 60 MG capsule Take 1 capsule by mouth at bedtime.      docusate sodium (COLACE) 100 MG capsule Take 100-200 mg by mouth daily.     ibuprofen (ADVIL) 200 MG tablet Take 200 mg by mouth every 6 (six) hours as needed.     Liraglutide (VICTOZA) 18 MG/3ML SOPN Inject 1.8 mg into the skin daily.     metFORMIN (GLUCOPHAGE) 1000 MG tablet Take 1,000 mg by mouth 2 (two) times daily.     Venlafaxine HCl 150 MG TB24 Take 150 mg by mouth.     escitalopram (LEXAPRO) 20 MG tablet  Take 20 mg by mouth daily.     No current facility-administered medications on file prior to visit.     PAST MEDICAL HISTORY: Past Medical History:  Diagnosis Date   Anemia    iron deficiency   Anxiety    Arthritis    general -coccyx area, elbow   Back pain    Complication of anesthesia    Diabetes mellitus without complication (HCC)    Diverticulosis    Fatty liver    GERD (gastroesophageal reflux disease)    Joint pain    Knee pain    Obesity    PONV (postoperative nausea and vomiting)    drop in blood pressure(spinal anesthesia)   Sleep apnea    cpap used "automatic"    PAST SURGICAL HISTORY: Past Surgical History:  Procedure Laterality Date   ANAL FISSURE REPAIR     '07   BREAST REDUCTION SURGERY     '88   CESAREAN SECTION     x2   COLONOSCOPY WITH PROPOFOL N/A 02/23/2015   Procedure: COLONOSCOPY WITH PROPOFOL;  Surgeon: Juanita Craver, MD;  Location: [facility];  Service: Endoscopy;  Laterality: N/A;   ESOPHAGOGASTRODUODENOSCOPY (EGD) WITH PROPOFOL N/A 07/03/2017   Procedure: ESOPHAGOGASTRODUODENOSCOPY (EGD)  WITH PROPOFOL;  Surgeon: Juanita Craver, MD;  Location: [facility];  Service: Endoscopy;  Laterality: N/A;   GALLBLADDER SURGERY  08/2017   KNEE ARTHROSCOPY W/ ACL RECONSTRUCTION Right    remains with a ? tear.   REDUCTION MAMMAPLASTY Bilateral 1988   TONSILLECTOMY AND ADENOIDECTOMY     "Adenoids only"    SOCIAL HISTORY: Social History   Tobacco Use   Smoking status: Never Smoker   Smokeless tobacco: Never Used  Substance Use Topics   Alcohol use: No   Drug use: No    FAMILY HISTORY: Family History  Adopted: Yes    ROS: Review of Systems  Constitutional: Positive for malaise/fatigue and weight loss.  Cardiovascular: Negative for chest pain and claudication.  Gastrointestinal: Negative for nausea and vomiting.  Musculoskeletal: Negative for myalgias.       Negative muscle weakness  Endo/Heme/Allergies:        Negative hypoglycemia    PHYSICAL EXAM: Blood pressure (!) 100/59, pulse 87, temperature 97.9 F (36.6 C), temperature source Oral, height 5\' 6"  (1.676 m), weight 289 lb (131.1 kg), SpO2 96 %. Body mass index is 46.65 kg/m. Physical Exam Vitals signs reviewed.  Constitutional:      Appearance: Normal appearance. She is obese.  Cardiovascular:     Rate and Rhythm: Normal rate.     Pulses: Normal pulses.  Pulmonary:     Effort: Pulmonary effort is normal.     Breath sounds: Normal breath sounds.  Musculoskeletal: Normal range of motion.  Skin:    General: Skin is warm and dry.  Neurological:     Mental Status: She is alert and oriented to person, place, and time.  Psychiatric:        Mood and Affect: Mood normal.        Behavior: Behavior normal.     RECENT LABS AND TESTS: BMET    Component Value Date/Time   NA 138 12/02/2017 1619   K 4.7 12/02/2017 1619   CL 103 12/02/2017 1619   CO2 22 12/02/2017 1619   GLUCOSE 131 (H) 12/02/2017 1619   BUN 19 12/02/2017 1619   CREATININE 0.81 12/02/2017 1619   CALCIUM 10.2 12/02/2017 1619   GFRNONAA >60 12/02/2017 1619   GFRAA >60 12/02/2017 1619   No results found for: HGBA1C Lab Results  Component Value Date   INSULIN 15.1 12/16/2018   CBC    Component Value Date/Time   WBC 7.9 12/02/2017 1619   RBC 5.57 (H) 12/02/2017 1619   HGB 13.9 12/02/2017 1619   HCT 44.1 12/02/2017 1619   PLT 321 12/02/2017 1619   MCV 79.2 12/02/2017 1619   MCH 25.0 (L) 12/02/2017 1619   MCHC 31.5 12/02/2017 1619   RDW 14.3 12/02/2017 1619   LYMPHSABS 1.8 04/29/2009 1746   MONOABS 0.6 04/29/2009 1746   EOSABS 0.1 04/29/2009 1746   BASOSABS 0.1 04/29/2009 1746   Iron/TIBC/Ferritin/ %Sat No results found for: IRON, TIBC, FERRITIN, IRONPCTSAT Lipid Panel     Component Value Date/Time   CHOL 168 12/16/2018 1354   TRIG 102 12/16/2018 1354   HDL 54 12/16/2018 1354   LDLCALC 94 12/16/2018 1354   Hepatic Function Panel     Component  Value Date/Time   PROT 8.4 (H) 12/02/2017 1619   ALBUMIN 4.6 12/02/2017 1619   AST 33 12/02/2017 1619   ALT 26 12/02/2017 1619   ALKPHOS 71 12/02/2017 1619   BILITOT 0.9 12/02/2017 1619      Component Value Date/Time   TSH  1.360 12/16/2018 1354      OBESITY BEHAVIORAL INTERVENTION VISIT  Today's visit was # 3   Starting weight: 295 lbs Starting date: 12/16/2018 Today's weight : 289 lbs Today's date: 01/13/2019 Total lbs lost to date: 6    ASK: We discussed the diagnosis of obesity with Zelphia Cairo today and [name] agreed to give [place] permission to discuss obesity behavioral modification therapy today.  ASSESS: Kassie has the diagnosis of obesity and her BMI today is 46.67 Zuhra is in the action stage of change   ADVISE: Aynara was educated on the multiple health risks of obesity as well as the benefit of weight loss to improve her health. She was advised of the need for long term treatment and the importance of lifestyle modifications to improve her current health and to decrease her risk of future health problems.  AGREE: Multiple dietary modification options and treatment options were discussed and  Shaquan agreed to follow the recommendations documented in the above note.  ARRANGE: Hazelene was educated on the importance of frequent visits to treat obesity as outlined per CMS and USPSTF guidelines and agreed to schedule her next follow up appointment today.  I, Trixie Dredge, am acting as transcriptionist for Ilene Qua, MD  I have reviewed the above documentation for accuracy and completeness, and I agree with the above. - Ilene Qua, MD

## 2019-01-16 DIAGNOSIS — G4733 Obstructive sleep apnea (adult) (pediatric): Secondary | ICD-10-CM | POA: Diagnosis not present

## 2019-01-25 NOTE — Progress Notes (Unsigned)
Office: [phone]  /  Fax: [phone]    Date: February 02, 2019   Appointment Start Time:*** Duration:*** Provider: Glennie Isle, Psy.D. Type of Session: Individual Therapy  Location of Patient: *** Location of Provider: {Location of Service:[address]} Type of Contact: Telepsychological Visit via [employer] Web[employer]   Session Content: Karen [name] is a 53 y.o. female presenting via [place] for a follow-up appointment to address the previously established treatment goal of decreasing emotional eating. Today's appointment was a telepsychological visit, as this provider's clinic is seeing a limited number of patients for in-person visits due to COVID-19. Therapeutic services will resume to in-person appointments once deemed appropriate. Karen [name] expressed understanding regarding the rationale for telepsychological services, and provided verbal consent for today's appointment. Prior to proceeding with today's appointment, Karen [name]'s physical location at the time of this appointment was obtained. Karen [name] reported she was at *** and provided the address. In the event of technical difficulties, Karen [name] shared a phone number she could be reached at. Karen [name] and this provider participated in today's telepsychological service. Also, Karen [name] denied anyone else being present in the room or on the WebEx appointment ***.  This provider conducted a brief check-in and verbally administered the PHQ-9 and GAD-7. *** Karen [name] was receptive to today's session as evidenced by openness to sharing, responsiveness to feedback, and ***.  Mental Status Examination:  Appearance: {Appearance:22431} Behavior: {Behavior:22445} Mood: {Teletherapy mood:22435} Affect: {Affect:22436} Speech: {Speech:22432} Eye Contact: {Eye Contact:22433} Psychomotor Activity: {Motor Activity:22434} Thought Process: {thought process:22448}  Content/Perceptual Disturbances: {disturbances:22451} Orientation: {Orientation:22437} Cognition/Sensorium: {gbcognition:22449}  Insight: {Insight:22446} Judgment: {Insight:22446}  Structured Assessment Results: The Patient Health Questionnaire-9 (PHQ-9) is a self-report measure that assesses symptoms and severity of depression over the course of the last two weeks. Karen [name] obtained a score of *** suggesting {GBPHQ9SEVERITY:21752}. Karen [name] finds the endorsed symptoms to be {gbphq9difficulty:21754}. [name] interest or pleasure in doing things ***  Feeling down, depressed, or hopeless ***  Trouble falling or staying asleep, or sleeping too much ***  Feeling tired or having little energy ***  Poor appetite or overeating ***  Feeling bad about yourself --- or that you are a failure or have let yourself or your family down ***  Trouble concentrating on things, such as reading the newspaper or watching television ***  Moving or speaking so slowly that other people could have noticed? Or the opposite --- being so fidgety or restless that you have been moving around a lot more than usual ***  Thoughts that you would be better off dead or hurting yourself in some way ***  PHQ-9 Score ***    The Generalized Anxiety Disorder-7 (GAD-7) is a brief self-report measure that assesses symptoms of anxiety over the course of the last two weeks. Karen [name] obtained a score of *** suggesting {gbgad7severity:21753}. Karen [name] finds the endorsed symptoms to be {gbphq9difficulty:21754}. Feeling nervous, anxious, on edge ***  Not being able to stop or control worrying ***  Worrying too much about different things ***  Trouble relaxing ***  Being so restless that it's hard to sit still ***  Becoming easily annoyed or irritable ***  Feeling afraid as if something awful might happen ***  GAD-7 Score ***   Interventions:  {Interventions:22172}  DSM-5 Diagnosis: 311 (F32.8) Other Specified Depressive Disorder, Emotional Eating Behaviors  Treatment Goal & Progress: During the initial appointment with this provider, the following treatment goal was  established: decrease emotional eating. Karen [name] has demonstrated progress in her goal as evidenced by {gbtxprogress:22839}. Karen [name] also reported {gbtxprogress2:22951}.  Plan: Karen [name] continues to  appear able and willing to participate as evidenced by engagement in reciprocal conversation, and asking questions for clarification as appropriate. The next appointment will be scheduled in {gbweeks:21758}, which will be via [employer]. The next session will focus on reviewing learned skills, and working towards the established treatment goal.***

## 2019-01-28 ENCOUNTER — Ambulatory Visit (INDEPENDENT_AMBULATORY_CARE_PROVIDER_SITE_OTHER): Payer: BC Managed Care – PPO | Admitting: Family Medicine

## 2019-01-28 ENCOUNTER — Other Ambulatory Visit: Payer: Self-pay

## 2019-01-28 VITALS — BP 92/54 | HR 83 | Temp 98.1°F | Ht 66.0 in | Wt 288.0 lb

## 2019-01-28 DIAGNOSIS — Z6841 Body Mass Index (BMI) 40.0 and over, adult: Secondary | ICD-10-CM | POA: Diagnosis not present

## 2019-01-28 DIAGNOSIS — E559 Vitamin D deficiency, unspecified: Secondary | ICD-10-CM

## 2019-01-28 DIAGNOSIS — E1165 Type 2 diabetes mellitus with hyperglycemia: Secondary | ICD-10-CM

## 2019-02-02 ENCOUNTER — Ambulatory Visit (INDEPENDENT_AMBULATORY_CARE_PROVIDER_SITE_OTHER): Payer: Self-pay | Admitting: Psychology

## 2019-02-02 NOTE — Progress Notes (Signed)
Office: [phone]  /  Fax: [phone]   HPI:   Chief Complaint: OBESITY Karen [name] is here to discuss her progress with her obesity treatment plan. She is on the Category 4 plan and is following her eating plan approximately 60 % of the time. She states she is walking for 1 mile 3-4 times per week. Karen [name] voices the last few weeks were mostly good except her getting tired of eating so much meat. She is looking for alternatives to meat. She is going to the beach for 3 days and is renting a house.  Her weight is 288 lb (130.6 kg) today and has had a weight loss of 1 pound over a period of 2 weeks since her last visit. She has lost 7 lbs since starting treatment with [place].  Vitamin D Deficiency Karen [name] has a diagnosis of vitamin D deficiency. She is currently taking prescription Vit D. She notes fatigue and denies nausea, vomiting or muscle weakness.  Diabetes II with Hyperglycemia Karen [name] has a diagnosis of diabetes type II. Karen [name] denies GI side effects of Victoza. She is on 1.8 mg of Victoza and denies hypoglycemia. She has been working on intensive lifestyle modifications including diet, exercise, and weight loss to help control her blood glucose levels.  ASSESSMENT AND PLAN:  Vitamin D deficiency  Type 2 diabetes mellitus with hyperglycemia, without long-term current use of insulin (HCC)  Class 3 severe obesity with serious comorbidity and body mass index (BMI) of 45.0 to 49.9 in adult, unspecified obesity type ([place])  PLAN:  Vitamin D Deficiency Karen [name] was informed that low vitamin D levels contributes to fatigue and are associated with obesity, breast, and colon cancer. Bettyjean agrees to continue taking prescription Vit D 50,000 IU every week, no refill needed. She will follow up for routine testing of vitamin D, at least 2-3 times per year. She was informed of the risk of over-replacement of vitamin D and agrees to not increase her dose unless she discusses this with [place] first. Karen [name] agrees to follow  up with our clinic in 2 weeks.  Diabetes II with Hyperglycemia Karen [name] has been given extensive diabetes education by myself today including ideal fasting and post-prandial blood glucose readings, individual ideal Hgb A1c goals and hypoglycemia prevention. We discussed the importance of good blood sugar control to decrease the likelihood of diabetic complications such as nephropathy, neuropathy, limb loss, blindness, coronary artery disease, and death. We discussed the importance of intensive lifestyle modification including diet, exercise and weight loss as the first line treatment for diabetes. Karen [name] agrees to continue Victoza 1.8 mg SubQ daily, no refill needed. Karen [name] agrees to follow up with our clinic in 2 weeks.  I spent > than 50% of the 15 minute visit on counseling as documented in the note.  Obesity Karen [name] is currently in the action stage of change. As such, her goal is to continue with weight loss efforts She has agreed to follow the Category 4 plan Karen [name] has been instructed to work up to a goal of 150 minutes of combined cardio and strengthening exercise per week for weight loss and overall health benefits. We discussed the following Behavioral Modification Strategies today: increasing lean protein intake, increasing vegetables and work on meal planning and easy cooking plans, keeping healthy foods in the home, and planning for success   Karen [name] has agreed to follow up with our clinic in 2 weeks. She was informed of the importance of frequent follow up visits to maximize her success with intensive lifestyle  modifications for her multiple health conditions.  ALLERGIES: Allergies  Allergen Reactions  . Sulfa Antibiotics Diarrhea, Nausea And Vomiting and Rash    MEDICATIONS: Current Outpatient Medications on File Prior to Visit  Medication Sig Dispense Refill  . atorvastatin (LIPITOR) 10 MG tablet Take 1 tablet (10 mg total) by mouth daily. 30 tablet 0  . buPROPion (WELLBUTRIN XL) 150 MG 24  hr tablet 300 mg.     . dexlansoprazole (DEXILANT) 60 MG capsule Take 1 capsule by mouth at bedtime.     Marland Kitchen escitalopram (LEXAPRO) 20 MG tablet Take 20 mg by mouth daily.    . Liraglutide (VICTOZA) 18 MG/3ML SOPN Inject 1.8 mg into the skin daily.    . metFORMIN (GLUCOPHAGE) 1000 MG tablet Take 1,000 mg by mouth 2 (two) times daily.    . Venlafaxine HCl 150 MG TB24 Take 150 mg by mouth.    . Vitamin D, Ergocalciferol, (DRISDOL) 1.25 MG (50000 UT) CAPS capsule Take 1 capsule (50,000 Units total) by mouth every 7 (seven) days. 4 capsule 0  . docusate sodium (COLACE) 100 MG capsule Take 100-200 mg by mouth daily.    Marland Kitchen ibuprofen (ADVIL) 200 MG tablet Take 200 mg by mouth every 6 (six) hours as needed.     No current facility-administered medications on file prior to visit.     PAST MEDICAL HISTORY: Past Medical History:  Diagnosis Date  . Anemia    iron deficiency  . Anxiety   . Arthritis    general -coccyx area, elbow  . Back pain   . Complication of anesthesia   . Diabetes mellitus without complication ([place])   . Diverticulosis   . Fatty liver   . GERD (gastroesophageal reflux disease)   . Joint pain   . Knee pain   . Obesity   . PONV (postoperative nausea and vomiting)    drop in blood pressure(spinal anesthesia)  . Sleep apnea    cpap used "automatic"    PAST SURGICAL HISTORY: Past Surgical History:  Procedure Laterality Date  . ANAL FISSURE REPAIR     '07  . BREAST REDUCTION SURGERY     '88  . CESAREAN SECTION     x2  . COLONOSCOPY WITH PROPOFOL N/A 02/23/2015   Procedure: COLONOSCOPY WITH PROPOFOL;  Surgeon: Juanita Craver, MD;  Location: [facility];  Service: Endoscopy;  Laterality: N/A;  . ESOPHAGOGASTRODUODENOSCOPY (EGD) WITH PROPOFOL N/A 07/03/2017   Procedure: ESOPHAGOGASTRODUODENOSCOPY (EGD) WITH PROPOFOL;  Surgeon: Juanita Craver, MD;  Location: [facility];  Service: Endoscopy;  Laterality: N/A;  . GALLBLADDER SURGERY  08/2017  . KNEE ARTHROSCOPY W/ ACL  RECONSTRUCTION Right    remains with a ? tear.  Marland Kitchen REDUCTION MAMMAPLASTY Bilateral 1988  . TONSILLECTOMY AND ADENOIDECTOMY     "Adenoids only"    SOCIAL HISTORY: Social History   Tobacco Use  . Smoking status: Never Smoker  . Smokeless tobacco: Never Used  Substance Use Topics  . Alcohol use: No  . Drug use: No    FAMILY HISTORY: Family History  Adopted: Yes    ROS: Review of Systems  Constitutional: Positive for malaise/fatigue and weight loss.  Gastrointestinal: Negative for nausea and vomiting.  Musculoskeletal:       Negative muscle weakness  Endo/Heme/Allergies:       Negative hypoglycemia    PHYSICAL EXAM: Blood pressure (!) 92/54, pulse 83, temperature 98.1 F (36.7 C), temperature source Oral, height 5\' 6"  (1.676 m), weight 288 lb (130.6 kg), SpO2 97 %. Body  mass index is 46.48 kg/m. Physical Exam Vitals signs reviewed.  Constitutional:      Appearance: Normal appearance. She is obese.  Cardiovascular:     Rate and Rhythm: Normal rate.     Pulses: Normal pulses.  Pulmonary:     Effort: Pulmonary effort is normal.     Breath sounds: Normal breath sounds.  Musculoskeletal: Normal range of motion.  Skin:    General: Skin is warm and dry.  Neurological:     Mental Status: She is alert and oriented to person, place, and time.  Psychiatric:        Mood and Affect: Mood normal.        Behavior: Behavior normal.     RECENT LABS AND TESTS: BMET    Component Value Date/Time   NA 138 12/02/2017 1619   K 4.7 12/02/2017 1619   CL 103 12/02/2017 1619   CO2 22 12/02/2017 1619   GLUCOSE 131 (H) 12/02/2017 1619   BUN 19 12/02/2017 1619   CREATININE 0.81 12/02/2017 1619   CALCIUM 10.2 12/02/2017 1619   GFRNONAA >60 12/02/2017 1619   GFRAA >60 12/02/2017 1619   No results found for: HGBA1C Lab Results  Component Value Date   INSULIN 15.1 12/16/2018   CBC    Component Value Date/Time   WBC 7.9 12/02/2017 1619   RBC 5.57 (H) 12/02/2017 1619   HGB  13.9 12/02/2017 1619   HCT 44.1 12/02/2017 1619   PLT 321 12/02/2017 1619   MCV 79.2 12/02/2017 1619   MCH 25.0 (L) 12/02/2017 1619   MCHC 31.5 12/02/2017 1619   RDW 14.3 12/02/2017 1619   LYMPHSABS 1.8 04/29/2009 1746   MONOABS 0.6 04/29/2009 1746   EOSABS 0.1 04/29/2009 1746   BASOSABS 0.1 04/29/2009 1746   Iron/TIBC/Ferritin/ %Sat No results found for: IRON, TIBC, FERRITIN, IRONPCTSAT Lipid Panel     Component Value Date/Time   CHOL 168 12/16/2018 1354   TRIG 102 12/16/2018 1354   HDL 54 12/16/2018 1354   LDLCALC 94 12/16/2018 1354   Hepatic Function Panel     Component Value Date/Time   PROT 8.4 (H) 12/02/2017 1619   ALBUMIN 4.6 12/02/2017 1619   AST 33 12/02/2017 1619   ALT 26 12/02/2017 1619   ALKPHOS 71 12/02/2017 1619   BILITOT 0.9 12/02/2017 1619      Component Value Date/Time   TSH 1.360 12/16/2018 1354      OBESITY BEHAVIORAL INTERVENTION VISIT  Today's visit was # 4   Starting weight: 295 lbs Starting date: 12/16/2018 Today's weight : 288 lbs Today's date: 01/28/2019 Total lbs lost to date: 7    ASK: We discussed the diagnosis of obesity with Karen [name] today and Karen [name] agreed to give [place] permission to discuss obesity behavioral modification therapy today.  ASSESS: Karen [name] has the diagnosis of obesity and her BMI today is 46.51 Lasheena is in the action stage of change   ADVISE: Karen [name] was educated on the multiple health risks of obesity as well as the benefit of weight loss to improve her health. She was advised of the need for long term treatment and the importance of lifestyle modifications to improve her current health and to decrease her risk of future health problems.  AGREE: Multiple dietary modification options and treatment options were discussed and  Karen [name] agreed to follow the recommendations documented in the above note.  ARRANGE: Karen [name] was educated on the importance of frequent visits to treat obesity as outlined per CMS and USPSTF  guidelines  and agreed to schedule her next follow up appointment today.  I, Trixie Dredge, am acting as transcriptionist for Ilene Qua, MD  I have reviewed the above documentation for accuracy and completeness, and I agree with the above. - Ilene Qua, MD

## 2019-02-11 ENCOUNTER — Ambulatory Visit (INDEPENDENT_AMBULATORY_CARE_PROVIDER_SITE_OTHER): Payer: BC Managed Care – PPO | Admitting: Family Medicine

## 2019-02-11 ENCOUNTER — Other Ambulatory Visit: Payer: Self-pay

## 2019-02-11 VITALS — BP 103/67 | HR 88 | Temp 98.1°F | Ht 66.0 in | Wt 290.0 lb

## 2019-02-11 DIAGNOSIS — E559 Vitamin D deficiency, unspecified: Secondary | ICD-10-CM | POA: Diagnosis not present

## 2019-02-11 DIAGNOSIS — E1165 Type 2 diabetes mellitus with hyperglycemia: Secondary | ICD-10-CM

## 2019-02-11 DIAGNOSIS — Z6841 Body Mass Index (BMI) 40.0 and over, adult: Secondary | ICD-10-CM | POA: Diagnosis not present

## 2019-02-15 DIAGNOSIS — G4733 Obstructive sleep apnea (adult) (pediatric): Secondary | ICD-10-CM | POA: Diagnosis not present

## 2019-02-15 NOTE — Progress Notes (Signed)
Office: [phone]  /  Fax: [phone]    Date: February 17, 2019   Appointment Start Time: 2:10pm Duration: 20 minutes Provider: Glennie Isle, Psy.D. Type of Session: Individual Therapy  Location of Patient: Home Location of Provider: Provider's Home Type of Contact: Telephone call   Session Content:Of note, this provider called Karen [name] at 2:02pm as she did not present for the [facility] appointment. She reported difficulty with connecting. Thus, directions were provided and the e-mail with the secure link was re-sent. This provider called [name] back at 2:06pm as she had not presented for the [facility] appointment. This provider attempted to provide further assistance. Connection was not possible; therefore, the appointment proceeded as a regular phone call. As such, today's appointment was initiated 10 minutes late. Karen [name] is a 53 y.o. female presenting via a phone call for a follow-up appointment to address the previously established treatment goal of decreasing emotional eating. Today's appointment was a telepsychological visit, as it is an option for appointments to reduce exposure to COVID-19. Karen [name] expressed understanding regarding the rationale for telepsychological services, and provided verbal consent for today's appointment. Prior to proceeding with today's appointment, Karen [name] physical location at the time of this appointment was obtained. Karen [name] reported she was at home and provided the address. In the event of technical difficulties, Karen [name] shared a phone number she could be reached at. Karen [name] and this provider participated in today's telepsychological service. Also, Karen [name] denied anyone else being present in the room or on the call.  This provider conducted a brief check-in. Karen [name] reported, "I was doing good, and I'm still doing good." She acknowledged she "deviated" from her meal plan secondary to unpleasant emotions for a "few days." [name] explained her friend was recently diagnosed with ALS. Associated  thoughts and feelings were processed. Psychoeducation regarding mindfulness was provided. A handout was provided to Karen [name] with further information regarding mindfulness, including exercises. This provider also explained the benefit of mindfulness as it relates to emotional eating. [name] was encouraged to engage in the provided exercises between now and the next appointment with this provider. Karen [name] agreed. She was led through an exercise involving her senses. Karen [name] provided verbal consent during today's appointment for this provider to send the handout about mindfulness via e-mail. Of note, Karen [name] reported she has not rescheduled her appointment with Karen [name], but noted [name] has called to check-in. Karen [name] plans to terminate services. Karen [name] was receptive to today's session as evidenced by openness to sharing, responsiveness to feedback, and willingness to engage in mindfulness exercises.  Mental Status Examination:  Appearance: unable to assess  Behavior: cooperative Mood: euthymic Affect: unable to fully assess Speech: normal in rate, volume, and tone Eye Contact: unable to assess Psychomotor Activity: unable to assess Thought Process: linear, logical, and goal directed  Content/Perceptual Disturbances: no hallucinations, delusions, bizarre thinking or behavior reported or observed and no evidence of suicidal and homicidal ideation, plan, and intent Orientation: time, person, place and purpose of appointment Cognition/Sensorium: memory, attention, language, and fund of knowledge intact  Insight: good Judgment: good  Interventions:  Conducted a brief chart review Provided empathic reflections and validation Reviewed content from the previous session Psychoeducation provided regarding mindfulness Engaged patient in a mindfulness exercise Employed supportive psychotherapy interventions to facilitate reduced distress, and to improve coping skills with identified stressors Employed acceptance and  commitment interventions to emphasize mindfulness and acceptance without struggle  Processed thoughts and feelings  DSM-5 Diagnosis: 311 (F32.8) Other Specified Depressive Disorder, Emotional Eating Behaviors  Treatment Goal &  Progress: During the initial appointment with this provider, the following treatment goal was established: decrease emotional eating. [name] has demonstrated progress in her goal as evidenced by increased awareness of hunger patterns and triggers for emotional eating. [name] also demonstrates willingness to engage in mindfulness exercises.  Plan: Glendene continues to appear able and willing to participate as evidenced by engagement in reciprocal conversation, and asking questions for clarification as appropriate. The next appointment will be scheduled in three weeks, which will be via [employer]. The next session will focus further on mindfulness.

## 2019-02-15 NOTE — Progress Notes (Signed)
Office: [phone]  /  Fax: [phone]   HPI:   Chief Complaint: OBESITY Karen [name] is here to discuss her progress with her obesity treatment plan. She is on the Category 4 plan and is following her eating plan approximately 50% of the time. She states she is walking 1.5 miles 3-4 times per week. Karen [name] states over the last few weeks she has noticed changes in how her clothes fit. She states she hasn't followed the plan more than 50%. A friend, who has somewhat rapidly progressing ALS, visited which led to an increase in emotional eating. Her weight is 290 lb (131.5 kg) today and has had a weight gain of 2 lbs since her last visit. She has lost 5 lbs since starting treatment with [place].  Diabetes II with Hyperglycemia, not on Insulin Karen [name] has a diagnosis of diabetes type II. She is taking Victoza and metformin with no side effects and no feelings of hypoglycemia. Karen [name] does not report checking her blood sugars. She has been working on intensive lifestyle modifications including diet, exercise, and weight loss to help control her blood glucose levels.  Vitamin D deficiency Karen [name] has a diagnosis of Vitamin D deficiency. She is currently taking prescription Vit D and denies nausea, vomiting or muscle weakness but does report fatigue.  ASSESSMENT AND PLAN:  Type 2 diabetes mellitus with hyperglycemia, without long-term current use of insulin (HCC)  Vitamin D deficiency  Class 3 severe obesity with serious comorbidity and body mass index (BMI) of 45.0 to 49.9 in adult, unspecified obesity type ([place])  PLAN:  Diabetes II with Hyperglycemia, not on Insulin Karen [name] has been given extensive diabetes education by myself today including ideal fasting and post-prandial blood glucose readings, individual ideal HgA1c goals  and hypoglycemia prevention. We discussed the importance of good blood sugar control to decrease the likelihood of diabetic complications such as nephropathy, neuropathy, limb loss,  blindness, coronary artery disease, and death. We discussed the importance of intensive lifestyle modification including diet, exercise and weight loss as the first line treatment for diabetes. Karen [name] agrees to continue her current diabetes medications (no refills needed) and will follow-up at the agreed upon time.  Vitamin D Deficiency Karen [name] was informed that low Vitamin D levels contributes to fatigue and are associated with obesity, breast, and colon cancer. She agrees to continue to take prescription Vit D @ 50,000 IU every week (no refill needed) and will follow-up for routine testing of Vitamin D, at least 2-3 times per year. She was informed of the risk of over-replacement of Vitamin D and agrees to not increase her dose unless she discusses this with [place] first. Karen [name] agrees to follow-up with our clinic in 2 weeks.  I spent > than 50% of the 15 minute visit on counseling as documented in the note.  Obesity Karen [name] is currently in the action stage of change. As such, her goal is to continue with weight loss efforts. She has agreed to follow the Category 4 plan. Karen [name] has been instructed to add resistance training 10-15 minutes 2 times per week for weight loss and overall health benefits. We discussed the following Behavioral Modification Stratagies today: increasing lean protein intake, keeping healthy foods in the home, emotional eating strategies, and avoiding temptations.  Karen [name] has agreed to follow-up with our clinic in 2 weeks. She was informed of the importance of frequent follow-up visits to maximize her success with intensive lifestyle modifications for her multiple health conditions.  ALLERGIES: Allergies  Allergen Reactions  . Sulfa  Antibiotics Diarrhea, Nausea And Vomiting and Rash    MEDICATIONS: Current Outpatient Medications on File Prior to Visit  Medication Sig Dispense Refill  . atorvastatin (LIPITOR) 10 MG tablet Take 1 tablet (10 mg total) by mouth daily. 30 tablet 0  .  buPROPion (WELLBUTRIN XL) 150 MG 24 hr tablet 300 mg.     . dexlansoprazole (DEXILANT) 60 MG capsule Take 1 capsule by mouth at bedtime.     . docusate sodium (COLACE) 100 MG capsule Take 100-200 mg by mouth daily.    Marland Kitchen escitalopram (LEXAPRO) 20 MG tablet Take 20 mg by mouth daily.    Marland Kitchen ibuprofen (ADVIL) 200 MG tablet Take 200 mg by mouth every 6 (six) hours as needed.    . Liraglutide (VICTOZA) 18 MG/3ML SOPN Inject 1.8 mg into the skin daily.    . metFORMIN (GLUCOPHAGE) 1000 MG tablet Take 1,000 mg by mouth 2 (two) times daily.    . Venlafaxine HCl 150 MG TB24 Take 150 mg by mouth.    . Vitamin D, Ergocalciferol, (DRISDOL) 1.25 MG (50000 UT) CAPS capsule Take 1 capsule (50,000 Units total) by mouth every 7 (seven) days. 4 capsule 0   No current facility-administered medications on file prior to visit.     PAST MEDICAL HISTORY: Past Medical History:  Diagnosis Date  . Anemia    iron deficiency  . Anxiety   . Arthritis    general -coccyx area, elbow  . Back pain   . Complication of anesthesia   . Diabetes mellitus without complication ([place])   . Diverticulosis   . Fatty liver   . GERD (gastroesophageal reflux disease)   . Joint pain   . Knee pain   . Obesity   . PONV (postoperative nausea and vomiting)    drop in blood pressure(spinal anesthesia)  . Sleep apnea    cpap used "automatic"    PAST SURGICAL HISTORY: Past Surgical History:  Procedure Laterality Date  . ANAL FISSURE REPAIR     '07  . BREAST REDUCTION SURGERY     '88  . CESAREAN SECTION     x2  . COLONOSCOPY WITH PROPOFOL N/A 02/23/2015   Procedure: COLONOSCOPY WITH PROPOFOL;  Surgeon: Juanita Craver, MD;  Location: [facility];  Service: Endoscopy;  Laterality: N/A;  . ESOPHAGOGASTRODUODENOSCOPY (EGD) WITH PROPOFOL N/A 07/03/2017   Procedure: ESOPHAGOGASTRODUODENOSCOPY (EGD) WITH PROPOFOL;  Surgeon: Juanita Craver, MD;  Location: [facility];  Service: Endoscopy;  Laterality: N/A;  . GALLBLADDER SURGERY  08/2017   . KNEE ARTHROSCOPY W/ ACL RECONSTRUCTION Right    remains with a ? tear.  Marland Kitchen REDUCTION MAMMAPLASTY Bilateral 1988  . TONSILLECTOMY AND ADENOIDECTOMY     "Adenoids only"    SOCIAL HISTORY: Social History   Tobacco Use  . Smoking status: Never Smoker  . Smokeless tobacco: Never Used  Substance Use Topics  . Alcohol use: No  . Drug use: No    FAMILY HISTORY: Family History  Adopted: Yes   ROS: Review of Systems  Constitutional: Positive for malaise/fatigue.  Gastrointestinal: Negative for nausea and vomiting.  Musculoskeletal:       Negative for muscle weakness.  Endo/Heme/Allergies:       Negative for hypoglycemia.   PHYSICAL EXAM: Blood pressure 103/67, pulse 88, temperature 98.1 F (36.7 C), temperature source Oral, height 5\' 6"  (1.676 m), weight 290 lb (131.5 kg), SpO2 96 %. Body mass index is 46.81 kg/m. Physical Exam Vitals signs reviewed.  Constitutional:      Appearance: Normal  appearance. She is obese.  Cardiovascular:     Rate and Rhythm: Normal rate.     Pulses: Normal pulses.  Pulmonary:     Effort: Pulmonary effort is normal.     Breath sounds: Normal breath sounds.  Musculoskeletal: Normal range of motion.  Skin:    General: Skin is warm and dry.  Neurological:     Mental Status: She is alert and oriented to person, place, and time.  Psychiatric:        Behavior: Behavior normal.   RECENT LABS AND TESTS: BMET    Component Value Date/Time   NA 138 12/02/2017 1619   K 4.7 12/02/2017 1619   CL 103 12/02/2017 1619   CO2 22 12/02/2017 1619   GLUCOSE 131 (H) 12/02/2017 1619   BUN 19 12/02/2017 1619   CREATININE 0.81 12/02/2017 1619   CALCIUM 10.2 12/02/2017 1619   GFRNONAA >60 12/02/2017 1619   GFRAA >60 12/02/2017 1619   No results found for: HGBA1C Lab Results  Component Value Date   INSULIN 15.1 12/16/2018   CBC    Component Value Date/Time   WBC 7.9 12/02/2017 1619   RBC 5.57 (H) 12/02/2017 1619   HGB 13.9 12/02/2017 1619   HCT  44.1 12/02/2017 1619   PLT 321 12/02/2017 1619   MCV 79.2 12/02/2017 1619   MCH 25.0 (L) 12/02/2017 1619   MCHC 31.5 12/02/2017 1619   RDW 14.3 12/02/2017 1619   LYMPHSABS 1.8 04/29/2009 1746   MONOABS 0.6 04/29/2009 1746   EOSABS 0.1 04/29/2009 1746   BASOSABS 0.1 04/29/2009 1746   Iron/TIBC/Ferritin/ %Sat No results found for: IRON, TIBC, FERRITIN, IRONPCTSAT Lipid Panel     Component Value Date/Time   CHOL 168 12/16/2018 1354   TRIG 102 12/16/2018 1354   HDL 54 12/16/2018 1354   LDLCALC 94 12/16/2018 1354   Hepatic Function Panel     Component Value Date/Time   PROT 8.4 (H) 12/02/2017 1619   ALBUMIN 4.6 12/02/2017 1619   AST 33 12/02/2017 1619   ALT 26 12/02/2017 1619   ALKPHOS 71 12/02/2017 1619   BILITOT 0.9 12/02/2017 1619      Component Value Date/Time   TSH 1.360 12/16/2018 1354   Results for MASIEL, LOFTHOUSE (MRN [id number]) as of 02/15/2019 11:57  Ref. Range 12/16/2018 13:54  Vitamin D, 25-Hydroxy Latest Ref Range: 30.0 - 100.0 ng/mL 23.3 (L)   OBESITY BEHAVIORAL INTERVENTION VISIT  Today's visit was #5  Starting weight: 295 lbs Starting date: 12/16/2018 Today's weight: 290 lbs  Today's date: 02/11/2019 Total lbs lost to date: 5    02/11/2019  Height 5\' 6"  (1.676 m)  Weight 290 lb (131.5 kg)  BMI (Calculated) 46.83  BLOOD PRESSURE - SYSTOLIC [id number]  BLOOD PRESSURE - DIASTOLIC 67   Body Fat % [id number] %  Total Body Water (lbs) 94.2 lbs   ASK: We discussed the diagnosis of obesity with Zelphia [place] today and [place] agreed to give [place] permission to discuss obesity behavioral modification therapy today.  ASSESS: Emunah has the diagnosis of obesity and her BMI today is 46.8. Persephany is in the action stage of change.   ADVISE: Morine was educated on the multiple health risks of obesity as well as the benefit of weight loss to improve her health. She was advised of the need for long term treatment and the importance of lifestyle modifications to improve her current health  and to decrease her risk of future health problems.  AGREE: Multiple dietary modification  options and treatment options were discussed and  T[place] agreed to follow the recommendations documented in the above note.  ARRANGE: Amaryah was educated on the importance of frequent visits to treat obesity as outlined per CMS and USPSTF guidelines and agreed to schedule her next follow up appointment today.  I, Michaelene Song, am acting as transcriptionist for Ilene Qua, MD   I have reviewed the above documentation for accuracy and completeness, and I agree with the above. - Ilene Qua, MD

## 2019-02-17 ENCOUNTER — Ambulatory Visit (INDEPENDENT_AMBULATORY_CARE_PROVIDER_SITE_OTHER): Payer: BC Managed Care – PPO | Admitting: Psychology

## 2019-02-17 ENCOUNTER — Other Ambulatory Visit: Payer: Self-pay

## 2019-02-17 DIAGNOSIS — F3289 Other specified depressive episodes: Secondary | ICD-10-CM

## 2019-02-22 ENCOUNTER — Other Ambulatory Visit: Payer: Self-pay | Admitting: Nurse Practitioner

## 2019-02-22 DIAGNOSIS — Z1231 Encounter for screening mammogram for malignant neoplasm of breast: Secondary | ICD-10-CM

## 2019-02-25 ENCOUNTER — Encounter (INDEPENDENT_AMBULATORY_CARE_PROVIDER_SITE_OTHER): Payer: Self-pay | Admitting: Family Medicine

## 2019-02-25 ENCOUNTER — Ambulatory Visit (INDEPENDENT_AMBULATORY_CARE_PROVIDER_SITE_OTHER): Payer: BC Managed Care – PPO | Admitting: Family Medicine

## 2019-02-25 ENCOUNTER — Other Ambulatory Visit: Payer: Self-pay

## 2019-02-25 VITALS — BP 121/73 | HR 83 | Temp 98.2°F | Ht 66.0 in | Wt 289.0 lb

## 2019-02-25 DIAGNOSIS — E559 Vitamin D deficiency, unspecified: Secondary | ICD-10-CM | POA: Diagnosis not present

## 2019-02-25 DIAGNOSIS — Z6841 Body Mass Index (BMI) 40.0 and over, adult: Secondary | ICD-10-CM

## 2019-02-25 DIAGNOSIS — E1165 Type 2 diabetes mellitus with hyperglycemia: Secondary | ICD-10-CM | POA: Diagnosis not present

## 2019-02-25 DIAGNOSIS — Z9189 Other specified personal risk factors, not elsewhere classified: Secondary | ICD-10-CM | POA: Diagnosis not present

## 2019-02-25 MED ORDER — ATORVASTATIN CALCIUM 10 MG PO TABS: 10.0000 mg | ORAL_TABLET | Freq: Every day | ORAL | 0 refills | Status: DC

## 2019-02-25 MED ORDER — VITAMIN D (ERGOCALCIFEROL) 1.25 MG (50000 UNIT) PO CAPS: 50000.0000 [IU] | ORAL_CAPSULE | ORAL | 0 refills | Status: DC

## 2019-02-25 MED ORDER — VICTOZA 18 MG/3ML ~~LOC~~ SOPN: 1.8000 mg | PEN_INJECTOR | Freq: Every day | SUBCUTANEOUS | 0 refills | Status: DC

## 2019-03-02 NOTE — Progress Notes (Signed)
Office: [phone]  /  Fax: [phone]   HPI:   Chief Complaint: OBESITY Karen [name] is here to discuss her progress with her obesity treatment plan. She is on the Category 4 plan and is following her eating plan approximately 70 % of the time. She states she is walking 1 mile and using resistance bands for 10 minutes 5-6 times per week. Karen [name] is getting more committed in the past 2 weeks. She is emotionally better as she is coming to grips with her friend who has ALS. She is snacking later at night if she is staying up. She may be going over her snack caloires.  Her weight is 289 lb (131.1 kg) today and has had a weight loss of 1 pound over a period of 2 weeks since her last visit. She has lost 6 lbs since starting treatment with [place].  Vitamin D Deficiency Karen [name] has a diagnosis of vitamin D deficiency. She is currently taking prescription Vit D. She notes fatigue and denies nausea, vomiting or muscle weakness.  At risk for osteopenia and osteoporosis Karen [name] is at higher risk of osteopenia and osteoporosis due to vitamin D deficiency.   Diabetes II with Hyperglycemia Karen [name] has a diagnosis of diabetes type II. Karen [name] denies feelings of hypoglycemia or GI side effects. She has been working on intensive lifestyle modifications including diet, exercise, and weight loss to help control her blood glucose levels.  ASSESSMENT AND PLAN:  Vitamin D deficiency - Plan: Vitamin D, Ergocalciferol, (DRISDOL) 1.25 MG (50000 UT) CAPS capsule  Type 2 diabetes mellitus with hyperglycemia, without long-term current use of insulin (HCC) - Plan: liraglutide (VICTOZA) 18 MG/3ML SOPN, atorvastatin (LIPITOR) 10 MG tablet  At risk for osteoporosis  Class 3 severe obesity with serious comorbidity and body mass index (BMI) of 45.0 to 49.9 in adult, unspecified obesity type ([place])  PLAN:  Vitamin D Deficiency Karen [name] was informed that low vitamin D levels contributes to fatigue and are associated with obesity, breast, and  colon cancer. Karen [name] agrees to continue taking prescription Vit D 50,000 IU every week #4 and we will refill for 1 month. She will follow up for routine testing of vitamin D, at least 2-3 times per year. She was informed of the risk of over-replacement of vitamin D and agrees to not increase her dose unless she discusses this with [place] first. Karen [name] agrees to follow up with our clinic in 2 weeks.  At risk for osteopenia and osteoporosis Karen [name] was given extended (15 minutes) osteoporosis prevention counseling today. Karen [name] is at risk for osteopenia and osteoporsis due to her vitamin D deficiency. She was encouraged to take her vitamin D and follow her higher calcium diet and increase strengthening exercise to help strengthen her bones and decrease her risk of osteopenia and osteoporosis.  Diabetes II with Hyperglycemia Karen [name] has been given extensive diabetes education by myself today including ideal fasting and post-prandial blood glucose readings, individual ideal Hgb A1c goals and hypoglycemia prevention. We discussed the importance of good blood sugar control to decrease the likelihood of diabetic complications such as nephropathy, neuropathy, limb loss, blindness, coronary artery disease, and death. We discussed the importance of intensive lifestyle modification including diet, exercise and weight loss as the first line treatment for diabetes. Karen [name] agrees to continue Victoza 1.8 mg SubQ daily #3 pens and we will refill for 1 month; she agrees to continue taking Lipitor 10 mg PO daily #30 and we will refill for 1 month. Karen [name] agrees to follow up with our  clinic in 2 weeks.  Obesity Karen [name] is currently in the action stage of change. As such, her goal is to continue with weight loss efforts She has agreed to follow the Category 4 plan Karen [name] has been instructed to work up to a goal of 150 minutes of combined cardio and strengthening exercise per week for weight loss and overall health benefits. We discussed the  following Behavioral Modification Strategies today: increasing lean protein intake, increasing vegetables and work on meal planning and easy cooking plans, keeping healthy foods in the home, and planning for success   Karen [name] has agreed to follow up with our clinic in 2 weeks. She was informed of the importance of frequent follow up visits to maximize her success with intensive lifestyle modifications for her multiple health conditions.  ALLERGIES: Allergies  Allergen Reactions  . Sulfa Antibiotics Diarrhea, Nausea And Vomiting and Rash    MEDICATIONS: Current Outpatient Medications on File Prior to Visit  Medication Sig Dispense Refill  . buPROPion (WELLBUTRIN XL) 150 MG 24 hr tablet 300 mg.     . dexlansoprazole (DEXILANT) 60 MG capsule Take 1 capsule by mouth at bedtime.     . docusate sodium (COLACE) 100 MG capsule Take 100-200 mg by mouth daily.    Marland Kitchen escitalopram (LEXAPRO) 20 MG tablet Take 20 mg by mouth daily.    Marland Kitchen ibuprofen (ADVIL) 200 MG tablet Take 200 mg by mouth every 6 (six) hours as needed.    . metFORMIN (GLUCOPHAGE) 1000 MG tablet Take 1,000 mg by mouth 2 (two) times daily.    . Venlafaxine HCl 150 MG TB24 Take 150 mg by mouth.     No current facility-administered medications on file prior to visit.     PAST MEDICAL HISTORY: Past Medical History:  Diagnosis Date  . Anemia    iron deficiency  . Anxiety   . Arthritis    general -coccyx area, elbow  . Back pain   . Complication of anesthesia   . Diabetes mellitus without complication ([place])   . Diverticulosis   . Fatty liver   . GERD (gastroesophageal reflux disease)   . Joint pain   . Knee pain   . Obesity   . PONV (postoperative nausea and vomiting)    drop in blood pressure(spinal anesthesia)  . Sleep apnea    cpap used "automatic"    PAST SURGICAL HISTORY: Past Surgical History:  Procedure Laterality Date  . ANAL FISSURE REPAIR     '07  . BREAST REDUCTION SURGERY     '88  . CESAREAN SECTION     x2   . COLONOSCOPY WITH PROPOFOL N/A 02/23/2015   Procedure: COLONOSCOPY WITH PROPOFOL;  Surgeon: Juanita Craver, MD;  Location: [facility];  Service: Endoscopy;  Laterality: N/A;  . ESOPHAGOGASTRODUODENOSCOPY (EGD) WITH PROPOFOL N/A 07/03/2017   Procedure: ESOPHAGOGASTRODUODENOSCOPY (EGD) WITH PROPOFOL;  Surgeon: Juanita Craver, MD;  Location: [facility];  Service: Endoscopy;  Laterality: N/A;  . GALLBLADDER SURGERY  08/2017  . KNEE ARTHROSCOPY W/ ACL RECONSTRUCTION Right    remains with a ? tear.  Marland Kitchen REDUCTION MAMMAPLASTY Bilateral 1988  . TONSILLECTOMY AND ADENOIDECTOMY     "Adenoids only"    SOCIAL HISTORY: Social History   Tobacco Use  . Smoking status: Never Smoker  . Smokeless tobacco: Never Used  Substance Use Topics  . Alcohol use: No  . Drug use: No    FAMILY HISTORY: Family History  Adopted: Yes    ROS: Review of Systems  Constitutional: Positive  for malaise/fatigue and weight loss.  Gastrointestinal: Negative for nausea and vomiting.  Musculoskeletal:       Negative muscle weakness  Endo/Heme/Allergies:       Negative hypoglycemia    PHYSICAL EXAM: Blood pressure 121/73, pulse 83, temperature 98.2 F (36.8 C), temperature source Oral, height 5\' 6"  (1.676 m), weight 289 lb (131.1 kg), SpO2 99 %. Body mass index is 46.65 kg/m. Physical Exam Vitals signs reviewed.  Constitutional:      Appearance: Normal appearance. She is obese.  Cardiovascular:     Rate and Rhythm: Normal rate.     Pulses: Normal pulses.  Pulmonary:     Effort: Pulmonary effort is normal.     Breath sounds: Normal breath sounds.  Musculoskeletal: Normal range of motion.  Skin:    General: Skin is warm and dry.  Neurological:     Mental Status: She is alert and oriented to person, place, and time.  Psychiatric:        Mood and Affect: Mood normal.        Behavior: Behavior normal.     RECENT LABS AND TESTS: BMET    Component Value Date/Time   NA 138 12/02/2017 1619   K 4.7  12/02/2017 1619   CL 103 12/02/2017 1619   CO2 22 12/02/2017 1619   GLUCOSE 131 (H) 12/02/2017 1619   BUN 19 12/02/2017 1619   CREATININE 0.81 12/02/2017 1619   CALCIUM 10.2 12/02/2017 1619   GFRNONAA >60 12/02/2017 1619   GFRAA >60 12/02/2017 1619   No results found for: HGBA1C Lab Results  Component Value Date   INSULIN 15.1 12/16/2018   CBC    Component Value Date/Time   WBC 7.9 12/02/2017 1619   RBC 5.57 (H) 12/02/2017 1619   HGB 13.9 12/02/2017 1619   HCT 44.1 12/02/2017 1619   PLT 321 12/02/2017 1619   MCV 79.2 12/02/2017 1619   MCH 25.0 (L) 12/02/2017 1619   MCHC 31.5 12/02/2017 1619   RDW 14.3 12/02/2017 1619   LYMPHSABS 1.8 04/29/2009 1746   MONOABS 0.6 04/29/2009 1746   EOSABS 0.1 04/29/2009 1746   BASOSABS 0.1 04/29/2009 1746   Iron/TIBC/Ferritin/ %Sat No results found for: IRON, TIBC, FERRITIN, IRONPCTSAT Lipid Panel     Component Value Date/Time   CHOL 168 12/16/2018 1354   TRIG 102 12/16/2018 1354   HDL 54 12/16/2018 1354   LDLCALC 94 12/16/2018 1354   Hepatic Function Panel     Component Value Date/Time   PROT 8.4 (H) 12/02/2017 1619   ALBUMIN 4.6 12/02/2017 1619   AST 33 12/02/2017 1619   ALT 26 12/02/2017 1619   ALKPHOS 71 12/02/2017 1619   BILITOT 0.9 12/02/2017 1619      Component Value Date/Time   TSH 1.360 12/16/2018 1354      OBESITY BEHAVIORAL INTERVENTION VISIT  Today's visit was # 6   Starting weight: 295 lbs  Starting date: 12/16/2018 Today's weight : 289 lbs  Today's date: 02/25/2019 Total lbs lost to date: 6    ASK: We discussed the diagnosis of obesity with Karen [name] today and [place] agreed to give [place] permission to discuss obesity behavioral modification therapy today.  ASSESS: Karen [name] has the diagnosis of obesity and her BMI today is 46.67 Karen [name] is in the action stage of change   ADVISE: Karen [name] was educated on the multiple health risks of obesity as well as the benefit of weight loss to improve her health. She was  advised of the need  for long term treatment and the importance of lifestyle modifications to improve her current health and to decrease her risk of future health problems.  AGREE: Multiple dietary modification options and treatment options were discussed and  Karen [name] agreed to follow the recommendations documented in the above note.  ARRANGE: Karen [name] was educated on the importance of frequent visits to treat obesity as outlined per CMS and USPSTF guidelines and agreed to schedule her next follow up appointment today.  I, Trixie Dredge, am acting as transcriptionist for Ilene Qua, MD  I have reviewed the above documentation for accuracy and completeness, and I agree with the above. - Ilene Qua, MD

## 2019-03-09 NOTE — Progress Notes (Signed)
Office: [phone]  /  Fax: [phone]    Date: March 16, 2019   Appointment Start Time: 10:07am Duration: 20 minutes Provider: Glennie Isle, Psy.D. Type of Session: Individual Therapy  Location of Patient: Home Location of Provider: [facility] Type of Contact: Telephone call  Session Content: Of note, this provider called Karen [name] at 10:02am as she did not present for the WebEx appointment. She indicated she did not see the e-mail previously sent; therefore, the e-mail with the secure link was re-sent. However, she noted she was unable to connect. As such, today's appointment was initiated 7 minutes late via a telephone call. Karen [name] is a 53 y.o. female presenting via a telephone call for a follow-up appointment to address the previously established treatment goal of decreasing emotional eating. Today's appointment was a telepsychological visit, as it is an option for appointments to reduce exposure to COVID-19. Aylen expressed understanding regarding the rationale for telepsychological services, and provided verbal consent for today's appointment. Prior to proceeding with today's appointment, Elvenia's physical location at the time of this appointment was obtained. Vollie and this provider participated in today's telepsychological service. Also, Ariely denied anyone else being present in the room or on the phone call.  This provider conducted a brief check-in and verbally administered the PHQ-9 and GAD-7. Karen [name] noted, "I'm doing okay on my program." She indicated experiencing "looping unwanted thoughts" and noted a plan to speak with her PCP about medication options to assist with coping. In addition, [name] reported engaging in the five senses mindfulness exercise previously shared and described it as "remarkably helpful." [name] shared the aforementioned exercise has helped reduce emotional eating. She also noted, "I stopped snacking at night." Positive reinforcement was provided. Of  note, she indicated she did not receive the handout about mindfulness sent via e-mail and requested to pick up a paper copy today during her appointment with [facility], FNP-C. This provider shared she would leave a copy with front desk staff for [name] to pick up. Moreover, [name] shared she terminated services with [name]. Remainder of today's appointment focused on mindfulness. This provider discussed the utilization of YouTube for mindfulness exercises (e.g., videos by Merri Ray). 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 Khamoni reported the exercise "detoxed the stress really quickly." A copy of today's exercise was also left with front desk staff for [place]. Furthermore, session focused on termination planning as Meredith indicated, "I'm very good." Based on progress, [name] was receptive to scheduling a follow-up appointment in 3-4 weeks and an additional follow-up/termination appointment in 4 weeks. Overall, [name] was receptive to today's session as evidenced by openness to sharing, responsiveness to feedback, and willingness to continue to engaging in mindfulness exercises.   Mental Status Examination:  Appearance: unable to assess  Behavior: unable to assess Mood: euthymic Affect: unable to fully assess Speech: normal in rate, volume, and tone Eye Contact: unable to assess Psychomotor Activity: unable to assess Thought Process: linear, logical, and goal directed  Content/Perceptual Disturbances: no hallucinations, delusions, bizarre thinking or behavior reported or observed and no evidence of suicidal and homicidal ideation, plan, and intent Orientation: time, person, place and purpose of appointment Cognition/Sensorium: memory, attention, language, and fund of knowledge intact  Insight: good Judgment: good  Structured Assessment Results: The Patient Health Questionnaire-9 (PHQ-9) is a self-report measure that assesses symptoms and severity  of depression over the course of the last two weeks. [name] obtained a score of  4 suggesting minimal depression. Karen [name] finds the endorsed symptoms to be not difficult at all. Little interest or pleasure in doing things 0  Feeling down, depressed, or hopeless 0  Trouble falling or staying asleep, or sleeping too much 3  Feeling tired or having little energy 1  Poor appetite or overeating 0  Feeling bad about yourself --- or that you are a failure or have let yourself or your family down 0  Trouble concentrating on things, such as reading the newspaper or watching television 0  Moving or speaking so slowly that other people could have noticed? Or the opposite --- being so fidgety or restless that you have been moving around a lot more than usual 0  Thoughts that you would be better off dead or hurting yourself in some way 0  PHQ-9 Score 4    The Generalized Anxiety Disorder-7 (GAD-7) is a brief self-report measure that assesses symptoms of anxiety over the course of the last two weeks. Karen [name] obtained a score of 1 suggesting minimal anxiety. Karen [name] finds the endorsed symptoms to be not difficult at all. Feeling nervous, anxious, on edge 1  Not being able to stop or control worrying 0  Worrying too much about different things 0  Trouble relaxing 0  Being so restless that it's hard to sit still 0  Becoming easily annoyed or irritable 0  Feeling afraid as if something awful might happen 0  GAD-7 Score 1   Interventions:  Conducted a brief chart review Verbal administration of PHQ-9 and GAD-7 for symptom monitoring Provided empathic reflections and validation Reviewed content from the previous session Engaged patient in a mindfulness exercise Discussed termination planning Provided positive reinforcement Employed supportive psychotherapy interventions to facilitate reduced distress, and to improve coping skills with identified stressors Employed acceptance and commitment interventions to emphasize  mindfulness and acceptance without struggle  DSM-5 Diagnosis: 311 (F32.8) Other Specified Depressive Disorder, Emotional Eating Behaviors  Treatment Goal & Progress: During the initial appointment with this provider, the following treatment goal was established: decrease emotional eating. Calley has demonstrated progress in her goal as evidenced by increased awareness of hunger patterns and triggers for emotional eating. Johannah also reported a reduction in emotional eating and demonstrates willingness to engage in mindfulness exercises.  Plan: Misk continues to appear able and willing to participate as evidenced by engagement in reciprocal conversation, and asking questions for clarification as appropriate. The next appointment will be scheduled in three weeks, which will be via [employer]. The next session will focus on reviewing learned skills, and working towards the established treatment goal.

## 2019-03-12 ENCOUNTER — Ambulatory Visit (INDEPENDENT_AMBULATORY_CARE_PROVIDER_SITE_OTHER): Payer: BC Managed Care – PPO | Admitting: Licensed Clinical Social Worker

## 2019-03-12 DIAGNOSIS — F3341 Major depressive disorder, recurrent, in partial remission: Secondary | ICD-10-CM | POA: Diagnosis not present

## 2019-03-16 ENCOUNTER — Encounter (INDEPENDENT_AMBULATORY_CARE_PROVIDER_SITE_OTHER): Payer: Self-pay | Admitting: Family Medicine

## 2019-03-16 ENCOUNTER — Other Ambulatory Visit: Payer: Self-pay

## 2019-03-16 ENCOUNTER — Ambulatory Visit (INDEPENDENT_AMBULATORY_CARE_PROVIDER_SITE_OTHER): Payer: BC Managed Care – PPO | Admitting: Psychology

## 2019-03-16 ENCOUNTER — Ambulatory Visit (INDEPENDENT_AMBULATORY_CARE_PROVIDER_SITE_OTHER): Payer: BC Managed Care – PPO | Admitting: Family Medicine

## 2019-03-16 VITALS — BP 110/72 | HR 79 | Ht 66.0 in | Wt 283.0 lb

## 2019-03-16 DIAGNOSIS — E559 Vitamin D deficiency, unspecified: Secondary | ICD-10-CM

## 2019-03-16 DIAGNOSIS — F3289 Other specified depressive episodes: Secondary | ICD-10-CM | POA: Diagnosis not present

## 2019-03-16 DIAGNOSIS — Z9189 Other specified personal risk factors, not elsewhere classified: Secondary | ICD-10-CM

## 2019-03-16 DIAGNOSIS — E119 Type 2 diabetes mellitus without complications: Secondary | ICD-10-CM

## 2019-03-16 DIAGNOSIS — Z6841 Body Mass Index (BMI) 40.0 and over, adult: Secondary | ICD-10-CM

## 2019-03-16 MED ORDER — VITAMIN D (ERGOCALCIFEROL) 1.25 MG (50000 UNIT) PO CAPS: 50000.0000 [IU] | ORAL_CAPSULE | ORAL | 0 refills | Status: DC

## 2019-03-17 DIAGNOSIS — F329 Major depressive disorder, single episode, unspecified: Secondary | ICD-10-CM | POA: Diagnosis not present

## 2019-03-17 DIAGNOSIS — R4681 Obsessive-compulsive behavior: Secondary | ICD-10-CM | POA: Diagnosis not present

## 2019-03-17 NOTE — Progress Notes (Signed)
Office: [phone]  /  Fax: [phone]   HPI:   Chief Complaint: OBESITY Karen [name] is here to discuss her progress with her obesity treatment plan. She is on the Category 3 plan and is following her eating plan approximately 75 % of the time. She states she is doing resistance bands and walking 45 minutes 3 to 4 times per week. Karen [name] reports some boredom with meals. She does like the plan and she has adhered fairly well to the plan. Her weight is 283 lb (128.4 kg) today and has had a weight loss of 6 pounds over a period of 2 to 3 weeks since her last visit. She has lost 12 lbs since starting treatment with [place].  Vitamin D deficiency Karen [name] has a diagnosis of vitamin D deficiency. Her last vitamin D level was at 23.3 on 12/16/18 and was not at goal. Karen [name] is currently taking vit D and she denies nausea, vomiting or muscle weakness.  At risk for osteopenia and osteoporosis Karen [name] is at higher risk of osteopenia and osteoporosis due to vitamin D deficiency.   Diabetes II Karen [name] has a diagnosis of diabetes type II. DM is well controlled. Karen [name] is on Metformin 1,000 mg two times daily and Vicotza 1.8 mg daily. Her last A1c was at 6.1 and she denies any hypoglycemic episodes. Karen [name] does not check her blood sugars at home. She has been working on intensive lifestyle modifications including diet, exercise, and weight loss to help control her blood glucose levels.  ASSESSMENT AND PLAN:  Vitamin D deficiency - Plan: Vitamin D, Ergocalciferol, (DRISDOL) 1.25 MG (50000 UT) CAPS capsule  Type 2 diabetes mellitus without complication, without long-term current use of insulin (HCC)  At risk for osteoporosis  Class 3 severe obesity with serious comorbidity and body mass index (BMI) of 45.0 to 49.9 in adult, unspecified obesity type ([place])  PLAN:  Vitamin D Deficiency Karen [name] was informed that low vitamin D levels contributes to fatigue and are associated with obesity, breast, and colon cancer. Karen [name] agrees to  continue to take prescription Vit D @50 ,000 IU every week #4 with no refills and she will follow up for routine testing of vitamin D, at least 2-3 times per year. She was informed of the risk of over-replacement of vitamin D and agrees to not increase her dose unless she discusses this with [place] first. Karen [name] agrees to follow up as directed.  At risk for osteopenia and osteoporosis Karen [name] was given extended  (15 minutes) osteoporosis prevention counseling today. Karen [name] is at risk for osteopenia and osteoporosis due to her vitamin D deficiency. She was encouraged to take her vitamin D and follow her higher calcium diet and increase strengthening exercise to help strengthen her bones and decrease her risk of osteopenia and osteoporosis.  Diabetes II . We discussed the importance of intensive lifestyle modification including diet, exercise and weight loss as the first line treatment for diabetes. Karen [name] will continue metformin and victoza and she will follow up at the agreed upon time.  Obesity Karen [name] is currently in the action stage of change. As such, her goal is to continue with weight loss efforts She has agreed to follow the Category 4 plan with lunch and breakfast options Karen [name] will continue doing resistance bands and walking for 45 minutes, 3 to 4 times per week for weight loss and overall health benefits. We discussed the following Behavioral Modification Strategies today: planning for success, keeping healthy foods in the home and work on meal planning and easy  cooking plans  Handouts for breakfast options, recipes and lunch options, were given to Karen [name] today.  Karen [name] has agreed to follow up with our clinic in 2 to 3 weeks. She was informed of the importance of frequent follow up visits to maximize her success with intensive lifestyle modifications for her multiple health conditions.  ALLERGIES: Allergies  Allergen Reactions  . Sulfa Antibiotics Diarrhea, Nausea And Vomiting and Rash     MEDICATIONS: Current Outpatient Medications on File Prior to Visit  Medication Sig Dispense Refill  . atorvastatin (LIPITOR) 10 MG tablet Take 1 tablet (10 mg total) by mouth daily. 30 tablet 0  . buPROPion (WELLBUTRIN XL) 150 MG 24 hr tablet 300 mg.     . dexlansoprazole (DEXILANT) 60 MG capsule Take 1 capsule by mouth at bedtime.     . docusate sodium (COLACE) 100 MG capsule Take 100-200 mg by mouth daily.    Marland Kitchen escitalopram (LEXAPRO) 20 MG tablet Take 20 mg by mouth daily.    Marland Kitchen ibuprofen (ADVIL) 200 MG tablet Take 200 mg by mouth every 6 (six) hours as needed.    . liraglutide (VICTOZA) 18 MG/3ML SOPN Inject 0.3 mLs (1.8 mg total) into the skin daily. 3 pen 0  . metFORMIN (GLUCOPHAGE) 1000 MG tablet Take 1,000 mg by mouth 2 (two) times daily.    . Venlafaxine HCl 150 MG TB24 Take 150 mg by mouth.     No current facility-administered medications on file prior to visit.     PAST MEDICAL HISTORY: Past Medical History:  Diagnosis Date  . Anemia    iron deficiency  . Anxiety   . Arthritis    general -coccyx area, elbow  . Back pain   . Complication of anesthesia   . Diabetes mellitus without complication ([place])   . Diverticulosis   . Fatty liver   . GERD (gastroesophageal reflux disease)   . Joint pain   . Knee pain   . Obesity   . PONV (postoperative nausea and vomiting)    drop in blood pressure(spinal anesthesia)  . Sleep apnea    cpap used "automatic"    PAST SURGICAL HISTORY: Past Surgical History:  Procedure Laterality Date  . ANAL FISSURE REPAIR     '07  . BREAST REDUCTION SURGERY     '88  . CESAREAN SECTION     x2  . COLONOSCOPY WITH PROPOFOL N/A 02/23/2015   Procedure: COLONOSCOPY WITH PROPOFOL;  Surgeon: Juanita Craver, MD;  Location: [facility];  Service: Endoscopy;  Laterality: N/A;  . ESOPHAGOGASTRODUODENOSCOPY (EGD) WITH PROPOFOL N/A 07/03/2017   Procedure: ESOPHAGOGASTRODUODENOSCOPY (EGD) WITH PROPOFOL;  Surgeon: Juanita Craver, MD;  Location: [facility];   Service: Endoscopy;  Laterality: N/A;  . GALLBLADDER SURGERY  08/2017  . KNEE ARTHROSCOPY W/ ACL RECONSTRUCTION Right    remains with a ? tear.  Marland Kitchen REDUCTION MAMMAPLASTY Bilateral 1988  . TONSILLECTOMY AND ADENOIDECTOMY     "Adenoids only"    SOCIAL HISTORY: Social History   Tobacco Use  . Smoking status: Never Smoker  . Smokeless tobacco: Never Used  Substance Use Topics  . Alcohol use: No  . Drug use: No    FAMILY HISTORY: Family History  Adopted: Yes    ROS: Review of Systems  Constitutional: Positive for weight loss.  Gastrointestinal: Negative for nausea and vomiting.  Musculoskeletal:       Negative for muscle weakness  Endo/Heme/Allergies:       Negative for hypoglcyemia    PHYSICAL EXAM: Blood pressure  110/72, pulse 79, height 5\' 6"  (1.676 m), weight 283 lb (128.4 kg), SpO2 98 %. Body mass index is 45.68 kg/m. Physical Exam Vitals signs reviewed.  Constitutional:      Appearance: Normal appearance. She is well-developed. She is obese.  Cardiovascular:     Rate and Rhythm: Normal rate.  Pulmonary:     Effort: Pulmonary effort is normal.  Musculoskeletal: Normal range of motion.  Skin:    General: Skin is warm and dry.  Neurological:     Mental Status: She is alert and oriented to person, place, and time.  Psychiatric:        Mood and Affect: Mood normal.        Behavior: Behavior normal.     RECENT LABS AND TESTS: BMET    Component Value Date/Time   NA 138 12/02/2017 1619   K 4.7 12/02/2017 1619   CL 103 12/02/2017 1619   CO2 22 12/02/2017 1619   GLUCOSE 131 (H) 12/02/2017 1619   BUN 19 12/02/2017 1619   CREATININE 0.81 12/02/2017 1619   CALCIUM 10.2 12/02/2017 1619   GFRNONAA >60 12/02/2017 1619   GFRAA >60 12/02/2017 1619   No results found for: HGBA1C Lab Results  Component Value Date   INSULIN 15.1 12/16/2018   CBC    Component Value Date/Time   WBC 7.9 12/02/2017 1619   RBC 5.57 (H) 12/02/2017 1619   HGB 13.9 12/02/2017  1619   HCT 44.1 12/02/2017 1619   PLT 321 12/02/2017 1619   MCV 79.2 12/02/2017 1619   MCH 25.0 (L) 12/02/2017 1619   MCHC 31.5 12/02/2017 1619   RDW 14.3 12/02/2017 1619   LYMPHSABS 1.8 04/29/2009 1746   MONOABS 0.6 04/29/2009 1746   EOSABS 0.1 04/29/2009 1746   BASOSABS 0.1 04/29/2009 1746   Iron/TIBC/Ferritin/ %Sat No results found for: IRON, TIBC, FERRITIN, IRONPCTSAT Lipid Panel     Component Value Date/Time   CHOL 168 12/16/2018 1354   TRIG 102 12/16/2018 1354   HDL 54 12/16/2018 1354   LDLCALC 94 12/16/2018 1354   Hepatic Function Panel     Component Value Date/Time   PROT 8.4 (H) 12/02/2017 1619   ALBUMIN 4.6 12/02/2017 1619   AST 33 12/02/2017 1619   ALT 26 12/02/2017 1619   ALKPHOS 71 12/02/2017 1619   BILITOT 0.9 12/02/2017 1619      Component Value Date/Time   TSH 1.360 12/16/2018 1354     Ref. Range 12/16/2018 13:54  Vitamin D, 25-Hydroxy Latest Ref Range: 30.0 - 100.0 ng/mL 23.3 (L)    OBESITY BEHAVIORAL INTERVENTION VISIT  Today's visit was # 7   Starting weight: 295 lbs Starting date: 12/16/2018 Today's weight : 283 lbs  Today's date: 03/16/2019 Total lbs lost to date: 12    03/16/2019  Height 5\' 6"  (1.676 m)  Weight 283 lb (128.4 kg)  BMI (Calculated) 45.7  BLOOD PRESSURE - SYSTOLIC [id number]  BLOOD PRESSURE - DIASTOLIC 72   Body Fat % 52 %  Total Body Water (lbs) 94.8 lbs    ASK: We discussed the diagnosis of obesity with Karen [name] today and Karen [name] agreed to give [place] permission to discuss obesity behavioral modification therapy today.  ASSESS: Zayn has the diagnosis of obesity and her BMI today is 45.7 Naeli is in the action stage of change   ADVISE: Pilar was educated on the multiple health risks of obesity as well as the benefit of weight loss to improve her health. She was advised of  the need for long term treatment and the importance of lifestyle modifications to improve her current health and to decrease her risk of future health problems.   AGREE: Multiple dietary modification options and treatment options were discussed and  Katina agreed to follow the recommendations documented in the above note.  ARRANGE: Makinzee was educated on the importance of frequent visits to treat obesity as outlined per CMS and USPSTF guidelines and agreed to schedule her next follow up appointment today.  I, Karen [name], am acting as transcriptionist for [employer], FNP-C  I have reviewed the above documentation for accuracy and completeness, and I agree with the above.  - Karen Wessels, FNP-C.

## 2019-03-18 ENCOUNTER — Encounter (INDEPENDENT_AMBULATORY_CARE_PROVIDER_SITE_OTHER): Payer: Self-pay | Admitting: Family Medicine

## 2019-03-18 DIAGNOSIS — E559 Vitamin D deficiency, unspecified: Secondary | ICD-10-CM | POA: Insufficient documentation

## 2019-03-18 DIAGNOSIS — G4733 Obstructive sleep apnea (adult) (pediatric): Secondary | ICD-10-CM | POA: Diagnosis not present

## 2019-03-18 DIAGNOSIS — E119 Type 2 diabetes mellitus without complications: Secondary | ICD-10-CM | POA: Insufficient documentation

## 2019-03-24 NOTE — Progress Notes (Signed)
Office: [phone]  /  Fax: [phone]    Date: April 06, 2019   Appointment Start Time: 8:38am Duration: 22 minutes Provider: Glennie Isle, Psy.D. Type of Session: Individual Therapy  Location of Patient: Home Location of Provider: [facility] Type of Contact: Telephone call  Session Content: This provider called Karen [name] at 8:32am as she did not present for the [facility] appointment. Karen [name] indicated she was experiencing difficulty with connecting. Thus, the e-mail with the secure link was re-sent and assistance was provided. This provider called Karen [name] again at 8:38am as she did not present and she reported she was unable to connect via WebEx. As such, today's appointment was initiated 8 minutes late via a regular telephone call with Karen [name]'s verbal consent.    Karen [name] is a 53 y.o. female presenting via a regular telephone call for a follow-up appointment to address the previously established treatment goal of decreasing emotional eating. Today's appointment was a telepsychological visit, as it is an option for appointments to reduce exposure to COVID-19. Karen [name] expressed understanding regarding the rationale for telepsychological services, and provided verbal consent for today's appointment. Prior to proceeding with today's appointment, Karen [name]'s physical location at the time of this appointment was obtained. Karen [name] and this provider participated in today's telepsychological service. Also, Karen [name] denied anyone else being present in the room or on the telephone call.  This provider conducted a brief check-in. Karen [name] noted, "Everything seems to be status quo." She shared she does not feel as "overwhelmed." Karen [name] further shared she continues to engage in mindfulness exercises and noted, "It really did work." She was encouraged to engage in mindfulness exercises regularly and reviewed the rationale for it. She agreed. Regarding eating, Karen [name] noted, "Everything seems to be going okay with my plan."  She continues to report a reduction in emotional eating, especially as it relates to night time eating. Karen [name] of today's appointment focused on the upcoming holiday. Psychoeducation regarding making better choices and engaging in portion control during the holidays was provided. More specifically, this provider discussed the following strategies: coming to holiday meals hungry, but not starving; avoid filling up on appetizers; managing portion sizes; not completely depriving yourself; making the plate colorful (e.g., vegetables); pacing yourself (e.g., waiting 10 minutes before going back for seconds); taking advantage of the nutritious foods; practicing mindfulness; staying hydrated; and avoid bringing home leftovers or share leftovers. Karen [name] was receptive to today's session as evidenced by openness to sharing, responsiveness to feedback, and willingness to implement discussed strategies.  Mental Status Examination:  Appearance: unable to assess  Behavior: unable to assess Mood: euthymic Affect: unable to fully assess Speech: normal in rate, volume, and tone  Eye Contact: unable to assess Psychomotor Activity: unable to assess Thought Process: linear, logical, and goal directed  Content/Perceptual Disturbances: no hallucinations, delusions, bizarre thinking or behavior reported or observed and no evidence of suicidal and homicidal ideation, plan, and intent Orientation: time, person, place and purpose of appointment Cognition/Sensorium: memory, attention, language, and fund of knowledge intact  Insight: good Judgment: good  Interventions:  Conducted a brief chart review Provided empathic reflections and validation Reviewed content from the previous session Employed motivational interviewing skills to assess patient's willingness/desire to adhere to recommended medical treatments and assignments Employed supportive psychotherapy interventions to facilitate reduced distress, and to improve  coping skills with identified stressors Discussed behavioral strategies for the holidays  DSM-5 Diagnosis: 311 (F32.8) Other Specified Depressive Disorder, Emotional Eating Behaviors  Treatment Goal & Progress: During the initial appointment  with this provider, the following treatment goal was established: decrease emotional eating. Karen [name] has demonstrated progress in her goal as evidenced by increased awareness of hunger patterns and triggers for emotional eating. Karen [name] also reported a reduction in emotional eating and continues to demonstrate willingness to engage in learned skill(s).  Plan: Karen [name] continues to appear able and willing to participate as evidenced by engagement in reciprocal conversation, and asking questions for clarification as appropriate. The next appointment will be scheduled in one month, which will be via [employer]. The next session will focus on reviewing learned skills, and termination.

## 2019-04-05 ENCOUNTER — Ambulatory Visit
Admission: RE | Admit: 2019-04-05 | Discharge: 2019-04-05 | Disposition: A | Discharge status: Home / Self Care | Payer: BC Managed Care – PPO | Source: Ambulatory Visit | Attending: Nurse Practitioner | Admitting: Nurse Practitioner

## 2019-04-05 ENCOUNTER — Ambulatory Visit (INDEPENDENT_AMBULATORY_CARE_PROVIDER_SITE_OTHER): Payer: BC Managed Care – PPO | Admitting: Family Medicine

## 2019-04-05 ENCOUNTER — Other Ambulatory Visit: Payer: Self-pay

## 2019-04-05 DIAGNOSIS — Z1231 Encounter for screening mammogram for malignant neoplasm of breast: Secondary | ICD-10-CM | POA: Diagnosis not present

## 2019-04-06 ENCOUNTER — Ambulatory Visit (INDEPENDENT_AMBULATORY_CARE_PROVIDER_SITE_OTHER): Payer: BC Managed Care – PPO | Admitting: Psychology

## 2019-04-06 ENCOUNTER — Other Ambulatory Visit: Payer: Self-pay

## 2019-04-06 DIAGNOSIS — F3289 Other specified depressive episodes: Secondary | ICD-10-CM

## 2019-04-13 ENCOUNTER — Encounter (INDEPENDENT_AMBULATORY_CARE_PROVIDER_SITE_OTHER): Payer: Self-pay | Admitting: Family Medicine

## 2019-04-13 ENCOUNTER — Ambulatory Visit (INDEPENDENT_AMBULATORY_CARE_PROVIDER_SITE_OTHER): Payer: BC Managed Care – PPO | Admitting: Family Medicine

## 2019-04-13 ENCOUNTER — Other Ambulatory Visit: Payer: Self-pay

## 2019-04-13 VITALS — BP 111/70 | HR 99 | Temp 98.4°F | Ht 66.0 in | Wt 285.0 lb

## 2019-04-13 DIAGNOSIS — E119 Type 2 diabetes mellitus without complications: Secondary | ICD-10-CM | POA: Diagnosis not present

## 2019-04-13 DIAGNOSIS — E559 Vitamin D deficiency, unspecified: Secondary | ICD-10-CM | POA: Diagnosis not present

## 2019-04-13 DIAGNOSIS — Z9189 Other specified personal risk factors, not elsewhere classified: Secondary | ICD-10-CM | POA: Diagnosis not present

## 2019-04-13 DIAGNOSIS — K219 Gastro-esophageal reflux disease without esophagitis: Secondary | ICD-10-CM

## 2019-04-13 DIAGNOSIS — F3289 Other specified depressive episodes: Secondary | ICD-10-CM | POA: Diagnosis not present

## 2019-04-13 DIAGNOSIS — Z6841 Body Mass Index (BMI) 40.0 and over, adult: Secondary | ICD-10-CM

## 2019-04-13 MED ORDER — VITAMIN D (ERGOCALCIFEROL) 1.25 MG (50000 UNIT) PO CAPS: 50000.0000 [IU] | ORAL_CAPSULE | ORAL | 0 refills | Status: DC

## 2019-04-14 ENCOUNTER — Encounter (INDEPENDENT_AMBULATORY_CARE_PROVIDER_SITE_OTHER): Payer: Self-pay | Admitting: Family Medicine

## 2019-04-14 LAB — COMPREHENSIVE METABOLIC PANEL
ALT: 15 IU/L (ref 0–32)
AST: 15 IU/L (ref 0–40)
Albumin/Globulin Ratio: 1.7 (ref 1.2–2.2)
Albumin: 4.5 g/dL (ref 3.8–4.9)
Alkaline Phosphatase: 96 IU/L (ref 39–117)
BUN/Creatinine Ratio: 27 — ABNORMAL HIGH (ref 9–23)
BUN: 18 mg/dL (ref 6–24)
Bilirubin Total: 0.4 mg/dL (ref 0.0–1.2)
CO2: 29 mmol/L (ref 20–29)
Calcium: 9.4 mg/dL (ref 8.7–10.2)
Chloride: 100 mmol/L (ref 96–106)
Creatinine, Ser: 0.66 mg/dL (ref 0.57–1.00)
GFR calc Af Amer: 118 mL/min/{1.73_m2} (ref >59)
GFR calc non Af Amer: 103 mL/min/{1.73_m2} (ref >59)
Globulin, Total: 2.6 g/dL (ref 1.5–4.5)
Glucose: 178 mg/dL — ABNORMAL HIGH (ref 65–99)
Potassium: 4.7 mmol/L (ref 3.5–5.2)
Sodium: 140 mmol/L (ref 134–144)
Total Protein: 7.1 g/dL (ref 6.0–8.5)

## 2019-04-14 LAB — SPECIMEN STATUS REPORT

## 2019-04-14 LAB — CBC WITH DIFFERENTIAL/PLATELET
Basophils Absolute: 0.1 10*3/uL (ref 0.0–0.2)
Basos: 1 %
EOS (ABSOLUTE): 0.2 10*3/uL (ref 0.0–0.4)
Eos: 3 %
Hematocrit: 40.4 % (ref 34.0–46.6)
Hemoglobin: 13.3 g/dL (ref 11.1–15.9)
Immature Grans (Abs): 0 10*3/uL (ref 0.0–0.1)
Immature Granulocytes: 0 %
Lymphocytes Absolute: 2.3 10*3/uL (ref 0.7–3.1)
Lymphs: 32 %
MCH: 25.1 pg — ABNORMAL LOW (ref 26.6–33.0)
MCHC: 32.9 g/dL (ref 31.5–35.7)
MCV: 76 fL — ABNORMAL LOW (ref 79–97)
Monocytes Absolute: 0.5 10*3/uL (ref 0.1–0.9)
Monocytes: 7 %
Neutrophils Absolute: 4.2 10*3/uL (ref 1.4–7.0)
Neutrophils: 57 %
Platelets: 320 10*3/uL (ref 150–450)
RBC: 5.29 x10E6/uL — ABNORMAL HIGH (ref 3.77–5.28)
RDW: 14.3 % (ref 11.7–15.4)
WBC: 7.2 10*3/uL (ref 3.4–10.8)

## 2019-04-14 LAB — LIPID PANEL WITH LDL/HDL RATIO
Cholesterol, Total: 144 mg/dL (ref 100–199)
HDL: 56 mg/dL (ref >39)
LDL Chol Calc (NIH): 74 mg/dL (ref 0–99)
LDL/HDL Ratio: 1.3 ratio (ref 0.0–3.2)
Triglycerides: 70 mg/dL (ref 0–149)
VLDL Cholesterol Cal: 14 mg/dL (ref 5–40)

## 2019-04-14 LAB — HEMOGLOBIN A1C
Est. average glucose Bld gHb Est-mCnc: 134 mg/dL
Hgb A1c MFr Bld: 6.3 % — ABNORMAL HIGH (ref 4.8–5.6)

## 2019-04-14 LAB — VITAMIN B12: Vitamin B-12: 425 pg/mL (ref 232–1245)

## 2019-04-14 LAB — VITAMIN D 25 HYDROXY (VIT D DEFICIENCY, FRACTURES): Vit D, 25-Hydroxy: 31.9 ng/mL (ref 30.0–100.0)

## 2019-04-14 NOTE — Progress Notes (Signed)
Office: [phone]  /  Fax: [phone]   HPI:   Chief Complaint: OBESITY Karen [name] is here to discuss her progress with her obesity treatment plan. She is on the Category 4 plan with breakfast and lunch options and is following her eating plan approximately 50 % of the time. She states she is walking and doing resistance bands for 35-40 minutes 2 times per week. Karen [name] is doing well and enjoyed time with her 51 year old daughter over the holiday. She is ready to get back on track.  Her weight is 285 lb (129.3 kg) today and has gained 2 lbs since her last visit. She has lost 10 lbs since starting treatment with [place].  Vitamin D Deficiency Karen [name] has a diagnosis of vitamin D deficiency. She is currently taking prescription Vit D. Last Vit D was 23.3 on 12/16/2018. She denies nausea, vomiting or muscle weakness.  At risk for osteopenia and osteoporosis Karen [name] is at higher risk of osteopenia and osteoporosis due to vitamin D deficiency.   Diabetes II Karen [name] has a diagnosis of diabetes type II. Karen [name] is taking metformin and Victoza. Last B12 level was 348 and last A1c was done by Karen [name], [facility] and was at 6.1. She denies hypoglycemia. She has been working on intensive lifestyle modifications including diet, exercise, and weight loss to help control her blood glucose levels.  GERD Tirah has a diagnosis of GERD. She is taking Dexilant and notes her symptoms are stable.  Depression with Emotional Eating Behaviors Karen [name] is working with Dr. Mallie Mussel, and she is taking Wellbutrin and Lexapro. Karen [name] struggles with emotional eating and using food for comfort to the extent that it is negatively impacting her health. She often snacks when she is not hungry. Karen [name] sometimes feels she is out of control and then feels guilty that she made poor food choices. She has been working on behavior modification techniques to help reduce her emotional eating and has been somewhat successful. She shows no sign of suicidal or  homicidal ideations.  Depression screen PHQ 2/9 12/16/2018  Decreased Interest 2  Down, Depressed, Hopeless 0  PHQ - 2 Score 2  Altered sleeping 2  Tired, decreased energy 2  Change in appetite 1  Feeling bad or failure about yourself  0  Trouble concentrating 1  Moving slowly or fidgety/restless 0  Suicidal thoughts 0  PHQ-9 Score 8  Difficult doing work/chores Not difficult at all    ASSESSMENT AND PLAN:  Vitamin D deficiency - Plan: B12, Vitamin D (25 hydroxy), Vitamin D, Ergocalciferol, (DRISDOL) 1.25 MG (50000 UT) CAPS capsule  Type 2 diabetes mellitus without complication, without long-term current use of insulin (HCC) - Plan: HgB A1c, Lipid Panel With LDL/HDL Ratio, Comprehensive Metabolic Panel (CMET), CBC w/Diff/Platelet  Gastroesophageal reflux disease, unspecified whether esophagitis present  Other depression, emotional eating  At risk for osteoporosis  Class 3 severe obesity with serious comorbidity and body mass index (BMI) of 45.0 to 49.9 in adult, unspecified obesity type (HCC)  PLAN:  Vitamin D Deficiency Karen [name] was informed that low vitamin D levels contributes to fatigue and are associated with obesity, breast, and colon cancer. Amyna agrees to continue taking prescription Vit D 50,000 IU every week #4 and we will refill for 1 month. She will follow up for routine testing of vitamin D, at least 2-3 times per year. She was informed of the risk of over-replacement of vitamin D and agrees to not increase her dose unless she discusses this with [place] first.  We will check labs today. Bess agrees to follow up with our clinic in 2 weeks.  At risk for osteopenia and osteoporosis Karen [name] was given extended (15 minutes) osteoporosis prevention counseling today. Cleophas is at risk for osteopenia and osteoporsis due to her vitamin D deficiency. She was encouraged to take her vitamin D and follow her higher calcium diet and increase strengthening exercise to help strengthen her bones and  decrease her risk of osteopenia and osteoporosis.  Diabetes II Tanille has been given diabetes education by myself including ideal fasting and post-prandial blood glucose readings, individual ideal Hgb A1c goals and hypoglycemia prevention. We discussed the importance of good blood sugar control to decrease the likelihood of diabetic complications such as nephropathy, neuropathy, limb loss, blindness, coronary artery disease, and death. We discussed the importance of intensive lifestyle modification including diet, exercise and weight loss as the first line treatment for diabetes. Carisha agrees to continue her diabetes medications, but we will check B12 level and A1c today. Bodhi agrees to follow up with our clinic in 2 weeks.  GERD Karen [name] agrees to continue her medications, and will continue to monitor. Karen [name] agrees to follow up with our clinic in 2 weeks.  Depression with Emotional Eating Behaviors We discussed behavior modification techniques today to help Karen [name] deal with her emotional eating behaviors. Karen [name] agrees to continue her medications, and she agrees to follow up with our clinic in 2 weeks.  Obesity Karen [name] is currently in the action stage of change. As such, her goal is to continue with weight loss efforts She has agreed to follow the Category 4 plan Karen [name] has been instructed to work up to a goal of 150 minutes of combined cardio and strengthening exercise per week or as tolerated for weight loss and overall health benefits. We discussed the following Behavioral Modification Strategies today: increasing lean protein intake, decreasing simple carbohydrates, increasing vegetables, increase H20 intake, work on meal planning and easy cooking plans, dealing with family or coworker sabotage, holiday eating strategies, and travel eating strategies   Karen [name] has agreed to follow up with our clinic in 2 weeks. She was informed of the importance of frequent follow up visits to maximize her success with intensive  lifestyle modifications for her multiple health conditions.  ALLERGIES: Allergies  Allergen Reactions  . Sulfa Antibiotics Diarrhea, Nausea And Vomiting and Rash    MEDICATIONS: Current Outpatient Medications on File Prior to Visit  Medication Sig Dispense Refill  . atorvastatin (LIPITOR) 10 MG tablet Take 1 tablet (10 mg total) by mouth daily. 30 tablet 0  . buPROPion (WELLBUTRIN XL) 150 MG 24 hr tablet 300 mg.     . dexlansoprazole (DEXILANT) 60 MG capsule Take 1 capsule by mouth at bedtime.     . docusate sodium (COLACE) 100 MG capsule Take 100-200 mg by mouth daily.    Marland Kitchen escitalopram (LEXAPRO) 20 MG tablet Take 20 mg by mouth daily.    Marland Kitchen ibuprofen (ADVIL) 200 MG tablet Take 200 mg by mouth every 6 (six) hours as needed.    . liraglutide (VICTOZA) 18 MG/3ML SOPN Inject 0.3 mLs (1.8 mg total) into the skin daily. 3 pen 0  . metFORMIN (GLUCOPHAGE) 1000 MG tablet Take 1,000 mg by mouth 2 (two) times daily.    . Venlafaxine HCl 150 MG TB24 Take 150 mg by mouth.     No current facility-administered medications on file prior to visit.     PAST MEDICAL HISTORY: Past Medical History:  Diagnosis Date  .  Anemia    iron deficiency  . Anxiety   . Arthritis    general -coccyx area, elbow  . Back pain   . Complication of anesthesia   . Diabetes mellitus without complication ([place])   . Diverticulosis   . Fatty liver   . GERD (gastroesophageal reflux disease)   . Joint pain   . Knee pain   . Obesity   . PONV (postoperative nausea and vomiting)    drop in blood pressure(spinal anesthesia)  . Sleep apnea    cpap used "automatic"    PAST SURGICAL HISTORY: Past Surgical History:  Procedure Laterality Date  . ANAL FISSURE REPAIR     '07  . BREAST REDUCTION SURGERY     '88  . CESAREAN SECTION     x2  . COLONOSCOPY WITH PROPOFOL N/A 02/23/2015   Procedure: COLONOSCOPY WITH PROPOFOL;  Surgeon: Juanita Craver, MD;  Location: [facility];  Service: Endoscopy;  Laterality: N/A;  .  ESOPHAGOGASTRODUODENOSCOPY (EGD) WITH PROPOFOL N/A 07/03/2017   Procedure: ESOPHAGOGASTRODUODENOSCOPY (EGD) WITH PROPOFOL;  Surgeon: Juanita Craver, MD;  Location: [facility];  Service: Endoscopy;  Laterality: N/A;  . GALLBLADDER SURGERY  08/2017  . KNEE ARTHROSCOPY W/ ACL RECONSTRUCTION Right    remains with a ? tear.  Marland Kitchen REDUCTION MAMMAPLASTY Bilateral 1988  . TONSILLECTOMY AND ADENOIDECTOMY     "Adenoids only"    SOCIAL HISTORY: Social History   Tobacco Use  . Smoking status: Never Smoker  . Smokeless tobacco: Never Used  Substance Use Topics  . Alcohol use: No  . Drug use: No    FAMILY HISTORY: Family History  Adopted: Yes    ROS: Review of Systems  Constitutional: Negative for weight loss.  Gastrointestinal: Negative for nausea and vomiting.  Musculoskeletal:       Negative muscle weakness  Endo/Heme/Allergies:       Negative hypoglycemia  Psychiatric/Behavioral: Positive for depression. Negative for suicidal ideas.    PHYSICAL EXAM: Blood pressure 111/70, pulse 99, temperature 98.4 F (36.9 C), temperature source Oral, height 5\' 6"  (1.676 m), weight 285 lb (129.3 kg), SpO2 95 %. Body mass index is 46 kg/m. Physical Exam Vitals signs reviewed.  Constitutional:      Appearance: Normal appearance. She is obese.  Cardiovascular:     Rate and Rhythm: Normal rate.     Pulses: Normal pulses.  Pulmonary:     Effort: Pulmonary effort is normal.     Breath sounds: Normal breath sounds.  Musculoskeletal: Normal range of motion.  Skin:    General: Skin is warm and dry.  Neurological:     Mental Status: She is alert and oriented to person, place, and time.  Psychiatric:        Mood and Affect: Mood normal.        Behavior: Behavior normal.     RECENT LABS AND TESTS: BMET    Component Value Date/Time   NA 140 04/13/2019 0000   K 4.7 04/13/2019 0000   CL 100 04/13/2019 0000   CO2 29 04/13/2019 0000   GLUCOSE 178 (H) 04/13/2019 0000   GLUCOSE 131 (H)  12/02/2017 1619   BUN 18 04/13/2019 0000   CREATININE 0.66 04/13/2019 0000   CALCIUM 9.4 04/13/2019 0000   GFRNONAA 103 04/13/2019 0000   GFRAA 118 04/13/2019 0000   Lab Results  Component Value Date   HGBA1C 6.3 (H) 04/13/2019   Lab Results  Component Value Date   INSULIN 15.1 12/16/2018   CBC  Component Value Date/Time   WBC 7.2 04/13/2019 0000   WBC 7.9 12/02/2017 1619   RBC 5.29 (H) 04/13/2019 0000   RBC 5.57 (H) 12/02/2017 1619   HGB 13.3 04/13/2019 0000   HCT 40.4 04/13/2019 0000   PLT 320 04/13/2019 0000   MCV 76 (L) 04/13/2019 0000   MCH 25.1 (L) 04/13/2019 0000   MCH 25.0 (L) 12/02/2017 1619   MCHC 32.9 04/13/2019 0000   MCHC 31.5 12/02/2017 1619   RDW 14.3 04/13/2019 0000   LYMPHSABS 2.3 04/13/2019 0000   MONOABS 0.6 04/29/2009 1746   EOSABS 0.2 04/13/2019 0000   BASOSABS 0.1 04/13/2019 0000   Iron/TIBC/Ferritin/ %Sat No results found for: IRON, TIBC, FERRITIN, IRONPCTSAT Lipid Panel     Component Value Date/Time   CHOL 144 04/13/2019 0000   TRIG 70 04/13/2019 0000   HDL 56 04/13/2019 0000   LDLCALC 74 04/13/2019 0000   Hepatic Function Panel     Component Value Date/Time   PROT 7.1 04/13/2019 0000   ALBUMIN 4.5 04/13/2019 0000   AST 15 04/13/2019 0000   ALT 15 04/13/2019 0000   ALKPHOS 96 04/13/2019 0000   BILITOT 0.4 04/13/2019 0000      Component Value Date/Time   TSH 1.360 12/16/2018 1354      OBESITY BEHAVIORAL INTERVENTION VISIT  Today's visit was # 8   Starting weight: 295 lbs Starting date: 12/16/2018 Today's weight : 285 lbs Today's date: 04/13/2019 Total lbs lost to date: 10    ASK: We discussed the diagnosis of obesity with Dois Davenport today and Ishya agreed to give [place] permission to discuss obesity behavioral modification therapy today.  ASSESS: Cathalene has the diagnosis of obesity and her BMI today is 46.02 Sanaz is in the action stage of change   ADVISE: Devon was educated on the multiple health risks of obesity  as well as the benefit of weight loss to improve her health. She was advised of the need for long term treatment and the importance of lifestyle modifications to improve her current health and to decrease her risk of future health problems.  AGREE: Multiple dietary modification options and treatment options were discussed and  Dahlya agreed to follow the recommendations documented in the above note.  ARRANGE: Karen [name] was educated on the importance of frequent visits to treat obesity as outlined per CMS and USPSTF guidelines and agreed to schedule her next follow up appointment today.  Wilhemena Durie, am acting as transcriptionist for Briscoe Deutscher, DO  I have reviewed the above documentation for accuracy and completeness, and I agree with the above. Briscoe Deutscher, DO

## 2019-04-20 NOTE — Progress Notes (Signed)
Office: [phone]  /  Fax: [phone]    Date: May 03, 2019   Appointment Start Time: 8:36am Duration: 24 minutes Provider: Glennie Isle, Psy.D. Type of Session: Individual Therapy  Location of Patient: Home Location of Provider: [facility] Type of Contact: Telepsychological Visit via Telephone Call  Session Content: This provider called Karen [name] at 8:32am as she did not present for the WebEx appointment. Karen [name] reported having difficulty connecting for today's appointment. Thus, the e-mail with the secure link was re-sent and assistance was provided. She was unable to connect via WebEx; therefore, today's appointment proceeded as a regular telephone call with Karen [name]'s verbal consent. As such, today's appointment was initiated 6 minutes late.  Karen [name] is a 53 y.o. female presenting via telephone call for a follow-up appointment to address the previously established treatment goal of decreasing emotional eating. Today's appointment was a telepsychological visit due to COVID-19. Karen [name] provided verbal consent for today's telepsychological appointment and she is aware she is responsible for securing confidentiality on her end of the session. Prior to proceeding with today's appointment, Karen [name]'s physical location at the time of this appointment was obtained. Karen [name] and this provider participated in today's telepsychological service.   This provider conducted a brief check-in. Karen [name] shared about recent events, including plans for the upcoming holidays. She shared she continues to engage in learned skills and described them as "helpful." Regarding eating, she noted it is "going okay." Karen [name] also discussed engaging in physical activity by utilizing the provided resistance band. She also continues to report a reduction in emotional eating and described recognizing boredom can trigger emotional hunger in the evenings. As such, she noted, "I just go to bed."  Positive reinforcement was  provided. A plan was developed to help Karen [name] cope with emotional eating secondary to identified triggers using learned skills. She wrote down the following plan: focus on hydration, be prepared with snacks congruent to the meal plan, pause to ask questions when triggered to eat (e.g., Am I really hungry?; Is there something bothering me? Will I feel better if I eat?), and engage in coping skills after going through the aforementioned questions. She noted, "I think I've started on it" and provided examples. Overall, Karen [name] was receptive to today's appointment as evidenced by openness to sharing, responsiveness to feedback, and willingness to continue engaging in learned skills. She added, "It's been very helpful."  Mental Status Examination:  Appearance: unable to assess  Behavior: appropriate to circumstances Mood: euthymic Affect: mood congruent Speech: normal in rate, volume, and tone Eye Contact: unable to assess Psychomotor Activity: unable to assess  Gait: unable to assess Thought Process: linear, logical, and goal directed  Thought Content/Perception: no hallucinations, delusions, bizarre thinking or behavior reported or observed and no evidence of suicidal and homicidal ideation, plan, and intent Orientation: time, person, place and purpose of appointment Memory/Concentration: memory, attention, language, and fund of knowledge intact  Insight/Judgment: good  Interventions:  Completed a brief chart review Provided empathic reflections and validation Provided positive reinforcement Employed supportive psychotherapy interventions to facilitate reduced distress and to improve coping skills with identified stressors Employed motivational interviewing skills to assess patient's willingness/desire to adhere to recommended medical treatments and assignments Reviewed learned skills  DSM-5 Diagnosis: 311 (F32.8) Other Specified Depressive Disorder, Emotional Eating Behaviors  Treatment Goal &  Progress: During the initial appointment with this provider, the following treatment goal was established: decrease emotional eating. Karen [name] demonstrated progress in her goal as evidenced by increased awareness of hunger patterns,  increased awareness of triggers for emotional eating and reduction in emotional eating. Karen [name] also continues to demonstrate willingness to engage in learned skill(s).  Plan: As previously planned, today was Karen [name]'s last appointment with this provider. She reported a plan to contact [name] with [facility] should she need to re-initiate therapeutic services. She also acknowledged understanding that she may request a follow-up appointment with this provider in the future as long as she is still established with the clinic. No further follow-up planned by this provider.

## 2019-04-27 ENCOUNTER — Ambulatory Visit (INDEPENDENT_AMBULATORY_CARE_PROVIDER_SITE_OTHER): Payer: BC Managed Care – PPO | Admitting: Family Medicine

## 2019-04-29 DIAGNOSIS — Z20828 Contact with and (suspected) exposure to other viral communicable diseases: Secondary | ICD-10-CM | POA: Diagnosis not present

## 2019-05-03 ENCOUNTER — Ambulatory Visit (INDEPENDENT_AMBULATORY_CARE_PROVIDER_SITE_OTHER): Payer: BC Managed Care – PPO | Admitting: Psychology

## 2019-05-03 ENCOUNTER — Other Ambulatory Visit: Payer: Self-pay

## 2019-05-03 DIAGNOSIS — F3289 Other specified depressive episodes: Secondary | ICD-10-CM

## 2019-05-04 ENCOUNTER — Ambulatory Visit (INDEPENDENT_AMBULATORY_CARE_PROVIDER_SITE_OTHER): Payer: BC Managed Care – PPO | Admitting: Physician Assistant

## 2019-05-04 ENCOUNTER — Encounter (INDEPENDENT_AMBULATORY_CARE_PROVIDER_SITE_OTHER): Payer: Self-pay | Admitting: Physician Assistant

## 2019-05-04 ENCOUNTER — Other Ambulatory Visit: Payer: Self-pay

## 2019-05-04 VITALS — BP 113/74 | HR 86 | Temp 98.5°F | Ht 66.0 in | Wt 286.0 lb

## 2019-05-04 DIAGNOSIS — E119 Type 2 diabetes mellitus without complications: Secondary | ICD-10-CM

## 2019-05-04 DIAGNOSIS — E1165 Type 2 diabetes mellitus with hyperglycemia: Secondary | ICD-10-CM

## 2019-05-04 DIAGNOSIS — Z9189 Other specified personal risk factors, not elsewhere classified: Secondary | ICD-10-CM

## 2019-05-04 DIAGNOSIS — E559 Vitamin D deficiency, unspecified: Secondary | ICD-10-CM

## 2019-05-04 DIAGNOSIS — Z6841 Body Mass Index (BMI) 40.0 and over, adult: Secondary | ICD-10-CM

## 2019-05-04 MED ORDER — ATORVASTATIN CALCIUM 10 MG PO TABS: 10.0000 mg | ORAL_TABLET | Freq: Every day | ORAL | 0 refills | Status: DC

## 2019-05-04 NOTE — Progress Notes (Signed)
Office: [phone]  /  Fax: [phone]   HPI:  Chief Complaint: OBESITY Karen [name] is here to discuss her progress with her obesity treatment plan. She is on the Category 4 plan and states she is following her eating plan approximately 60% of the time. She states she is walking 30-40 minutes 2 times per week.  Karen [name] states that she struggles to get all of the protein in at dinner, so she is trying to break it up during the day. Her daughter eats ice cream at night and she is looking for some healthy alternatives to enjoy a snack at that time as well.  Today's visit was #9 Starting weight: 295 lbs Starting date: 12/16/2018 Today's weight: 286 lbs  Today's date: 05/04/2019 Total lbs lost to date: 9  Total lbs lost since last in-office visit: 0  Hyperlipidemia Karen [name] has a diagnosis of hyperlipidemia and is on Lipitor. No chest pain or myalgias. LDL, HDL, and triglycerides are improved. We discussed lab results today.  At risk for cardiovascular disease Karen [name] is at a higher than average risk for cardiovascular disease due to obesity. She currently denies any chest pain.  Vitamin D deficiency Karen [name] has a diagnosis of Vitamin D deficiency and is on Vitamin D. No nausea, vomiting, or muscle weakness. Last Vitamin D level was not at goal (31.9 on 04/13/2019). Lab results were discussed today.  Diabetes Mellitus Type II Karen [name] has diabetes mellitus type II and is on metformin and Victoza. Last A1c was 6.3 on 04/13/2019. No nausea, vomiting, or diarrhea. Lab results were discussed today.  ASSESSMENT AND PLAN:  Type 2 diabetes mellitus with hyperglycemia, without long-term current use of insulin (HCC) - Plan: atorvastatin (LIPITOR) 10 MG tablet  Vitamin D deficiency  Type 2 diabetes mellitus without complication, without long-term current use of insulin (HCC)  At risk for heart disease  Class 3 severe obesity with serious comorbidity and body mass index (BMI) of 45.0 to 49.9 in adult,  unspecified obesity type (Karen [name])  PLAN:  Hyperlipidemia Intensive lifestyle modifications as the first line treatment for hyperlipidemia. We discussed many lifestyle modifications today and Karen [name] will continue to work on diet, exercise and weight loss efforts. Karen [name] was given a refill on her Lipitor #30 with 0 refills and agrees to follow-up with our clinic in 2 weeks.  Cardiovascular risk counseling Karen [name] was given (~15 minutes) coronary artery disease prevention counseling today. She is 53 y.o. female and has risk factors for heart disease including obesity. We discussed intensive lifestyle modifications today with an emphasis on specific weight loss instructions and strategies.   Vitamin D Deficiency Karen [name] was informed that low Vitamin D levels contributes to fatigue and are associated with obesity, breast, and colon cancer. She agrees to continue taking Vit D and will follow-up for routine testing of Vitamin D, at least 2-3 times per year. She was informed of the risk of over-replacement of Vitamin D and agrees to not increase her dose unless she discusses this with [place] first. Karen [name] agrees to follow-up with our clinic in 2 weeks.  Diabetes II Karen [name] has been given diabetes education by myself today. Good blood sugar control is important to decrease the likelihood of diabetic complications such as nephropathy, neuropathy, limb loss, blindness, coronary artery disease, and death. Intensive lifestyle modification including diet, exercise and weight loss were discussed as the first line treatment for diabetes. Karen [name] will continue her medications and weight loss.  Obesity Karen [name] is currently in the action stage of change. As such,  her goal is to continue with weight loss efforts. She has agreed to follow the Category 4 plan. Karen [name] has been instructed to work up to a goal of 150 minutes of combined cardio and strengthening exercise per week for weight loss and overall health benefits. We discussed the following  Behavioral Modification Strategies today: increasing lean protein intake and planning for success.  Karen [name] has agreed to follow-up with our clinic in 2 weeks. She was informed of the importance of frequent follow-up visits to maximize her success with intensive lifestyle modifications for her multiple health conditions.  ALLERGIES: Allergies  Allergen Reactions  . Sulfa Antibiotics Diarrhea, Nausea And Vomiting and Rash    MEDICATIONS: Current Outpatient Medications on File Prior to Visit  Medication Sig Dispense Refill  . buPROPion (WELLBUTRIN XL) 150 MG 24 hr tablet 300 mg.     . dexlansoprazole (DEXILANT) 60 MG capsule Take 1 capsule by mouth at bedtime.     . docusate sodium (COLACE) 100 MG capsule Take 100-200 mg by mouth daily.    Marland Kitchen escitalopram (LEXAPRO) 20 MG tablet Take 20 mg by mouth daily.    Marland Kitchen ibuprofen (ADVIL) 200 MG tablet Take 200 mg by mouth every 6 (six) hours as needed.    . liraglutide (VICTOZA) 18 MG/3ML SOPN Inject 0.3 mLs (1.8 mg total) into the skin daily. 3 pen 0  . metFORMIN (GLUCOPHAGE) 1000 MG tablet Take 1,000 mg by mouth 2 (two) times daily.    . Venlafaxine HCl 150 MG TB24 Take 150 mg by mouth.    . Vitamin D, Ergocalciferol, (DRISDOL) 1.25 MG (50000 UT) CAPS capsule Take 1 capsule (50,000 Units total) by mouth every 7 (seven) days. 4 capsule 0   No current facility-administered medications on file prior to visit.    PAST MEDICAL HISTORY: Past Medical History:  Diagnosis Date  . Anemia    iron deficiency  . Anxiety   . Arthritis    general -coccyx area, elbow  . Back pain   . Complication of anesthesia   . Diabetes mellitus without complication ([place])   . Diverticulosis   . Fatty liver   . GERD (gastroesophageal reflux disease)   . Joint pain   . Knee pain   . Obesity   . PONV (postoperative nausea and vomiting)    drop in blood pressure(spinal anesthesia)  . Sleep apnea    cpap used "automatic"    PAST SURGICAL HISTORY: Past Surgical  History:  Procedure Laterality Date  . ANAL FISSURE REPAIR     '07  . BREAST REDUCTION SURGERY     '88  . CESAREAN SECTION     x2  . COLONOSCOPY WITH PROPOFOL N/A 02/23/2015   Procedure: COLONOSCOPY WITH PROPOFOL;  Surgeon: Juanita Craver, MD;  Location: [facility];  Service: Endoscopy;  Laterality: N/A;  . ESOPHAGOGASTRODUODENOSCOPY (EGD) WITH PROPOFOL N/A 07/03/2017   Procedure: ESOPHAGOGASTRODUODENOSCOPY (EGD) WITH PROPOFOL;  Surgeon: Juanita Craver, MD;  Location: [facility];  Service: Endoscopy;  Laterality: N/A;  . GALLBLADDER SURGERY  08/2017  . KNEE ARTHROSCOPY W/ ACL RECONSTRUCTION Right    remains with a ? tear.  Marland Kitchen REDUCTION MAMMAPLASTY Bilateral 1988  . TONSILLECTOMY AND ADENOIDECTOMY     "Adenoids only"    SOCIAL HISTORY: Social History   Tobacco Use  . Smoking status: Never Smoker  . Smokeless tobacco: Never Used  Substance Use Topics  . Alcohol use: No  . Drug use: No    FAMILY HISTORY: Family History  Adopted: Yes  ROS: Review of Systems  Constitutional: Negative for weight loss.  Cardiovascular: Negative for chest pain.  Gastrointestinal: Negative for diarrhea, nausea and vomiting.  Musculoskeletal: Negative for myalgias.       Negative for muscle weakness.   PHYSICAL EXAM: Blood pressure 113/74, pulse 86, temperature 98.5 F (36.9 C), temperature source Oral, height 5\' 6"  (1.676 m), weight 286 lb (129.7 kg), SpO2 98 %. Body mass index is 46.16 kg/m. Physical Exam Vitals reviewed.  Constitutional:      Appearance: Normal appearance. She is obese.  Cardiovascular:     Rate and Rhythm: Normal rate.     Pulses: Normal pulses.  Pulmonary:     Effort: Pulmonary effort is normal.     Breath sounds: Normal breath sounds.  Musculoskeletal:        General: Normal range of motion.  Skin:    General: Skin is warm and dry.  Neurological:     Mental Status: She is alert and oriented to person, place, and time.  Psychiatric:        Behavior: Behavior  normal.   RECENT LABS AND TESTS: BMET    Component Value Date/Time   NA 140 04/13/2019 0000   K 4.7 04/13/2019 0000   CL 100 04/13/2019 0000   CO2 29 04/13/2019 0000   GLUCOSE 178 (H) 04/13/2019 0000   GLUCOSE 131 (H) 12/02/2017 1619   BUN 18 04/13/2019 0000   CREATININE 0.66 04/13/2019 0000   CALCIUM 9.4 04/13/2019 0000   GFRNONAA 103 04/13/2019 0000   GFRAA 118 04/13/2019 0000   Lab Results  Component Value Date   HGBA1C 6.3 (H) 04/13/2019   Lab Results  Component Value Date   INSULIN 15.1 12/16/2018   CBC    Component Value Date/Time   WBC 7.2 04/13/2019 0000   WBC 7.9 12/02/2017 1619   RBC 5.29 (H) 04/13/2019 0000   RBC 5.57 (H) 12/02/2017 1619   HGB 13.3 04/13/2019 0000   HCT 40.4 04/13/2019 0000   PLT 320 04/13/2019 0000   MCV 76 (L) 04/13/2019 0000   MCH 25.1 (L) 04/13/2019 0000   MCH 25.0 (L) 12/02/2017 1619   MCHC 32.9 04/13/2019 0000   MCHC 31.5 12/02/2017 1619   RDW 14.3 04/13/2019 0000   LYMPHSABS 2.3 04/13/2019 0000   MONOABS 0.6 04/29/2009 1746   EOSABS 0.2 04/13/2019 0000   BASOSABS 0.1 04/13/2019 0000   Iron/TIBC/Ferritin/ %Sat No results found for: IRON, TIBC, FERRITIN, IRONPCTSAT Lipid Panel     Component Value Date/Time   CHOL 144 04/13/2019 0000   TRIG 70 04/13/2019 0000   HDL 56 04/13/2019 0000   LDLCALC 74 04/13/2019 0000   Hepatic Function Panel     Component Value Date/Time   PROT 7.1 04/13/2019 0000   ALBUMIN 4.5 04/13/2019 0000   AST 15 04/13/2019 0000   ALT 15 04/13/2019 0000   ALKPHOS 96 04/13/2019 0000   BILITOT 0.4 04/13/2019 0000      Component Value Date/Time   TSH 1.360 12/16/2018 1354    OBESITY BEHAVIORAL INTERVENTION VISIT DOCUMENTATION FOR INSURANCE (~15 minutes)  I, Michaelene Song, am acting as Location manager for Abby Potash, PA-C I, Abby Potash, PA-C have reviewed above note and agree with its content

## 2019-05-06 DIAGNOSIS — F411 Generalized anxiety disorder: Secondary | ICD-10-CM | POA: Diagnosis not present

## 2019-05-06 DIAGNOSIS — F429 Obsessive-compulsive disorder, unspecified: Secondary | ICD-10-CM | POA: Diagnosis not present

## 2019-05-25 ENCOUNTER — Ambulatory Visit (INDEPENDENT_AMBULATORY_CARE_PROVIDER_SITE_OTHER): Payer: BC Managed Care – PPO | Admitting: Physician Assistant

## 2019-06-02 ENCOUNTER — Ambulatory Visit (INDEPENDENT_AMBULATORY_CARE_PROVIDER_SITE_OTHER): Payer: BC Managed Care – PPO | Admitting: Physician Assistant

## 2019-06-02 ENCOUNTER — Other Ambulatory Visit: Payer: Self-pay

## 2019-06-02 ENCOUNTER — Encounter (INDEPENDENT_AMBULATORY_CARE_PROVIDER_SITE_OTHER): Payer: Self-pay | Admitting: Physician Assistant

## 2019-06-02 VITALS — BP 97/66 | HR 88 | Temp 98.3°F | Ht 66.0 in | Wt 292.0 lb

## 2019-06-02 DIAGNOSIS — Z6841 Body Mass Index (BMI) 40.0 and over, adult: Secondary | ICD-10-CM

## 2019-06-02 DIAGNOSIS — E559 Vitamin D deficiency, unspecified: Secondary | ICD-10-CM | POA: Diagnosis not present

## 2019-06-02 DIAGNOSIS — Z9189 Other specified personal risk factors, not elsewhere classified: Secondary | ICD-10-CM

## 2019-06-02 DIAGNOSIS — E7849 Other hyperlipidemia: Secondary | ICD-10-CM | POA: Diagnosis not present

## 2019-06-02 MED ORDER — VITAMIN D (ERGOCALCIFEROL) 1.25 MG (50000 UNIT) PO CAPS: 50000.0000 [IU] | ORAL_CAPSULE | ORAL | 0 refills | Status: DC

## 2019-06-02 MED ORDER — ATORVASTATIN CALCIUM 10 MG PO TABS: 10.0000 mg | ORAL_TABLET | Freq: Every day | ORAL | 0 refills | Status: DC

## 2019-06-02 NOTE — Progress Notes (Signed)
Chief Complaint:   OBESITY Karen [name] is here to discuss her progress with her obesity treatment plan along with follow-up of her obesity related diagnoses. Karen [name] is on the Category 4 Plan and states she is following her eating plan approximately 50% of the time. Karen [name] states she is walking for 50 minutes 1-2 times per week.  Today's visit was #: 10 Starting weight: 295 lbs Starting date: 12/16/2018 Today's weight: 292 lbs Today's date: 06/02/2019 Total lbs lost to date: 3 lbs Total lbs lost since last in-office visit: 0  Interim History: With her daughter back at home for the holidays, she did more baking than normal. Her daughter is back in college and now Tamma is ready to get back on track.  Subjective:   1. Hyperlipidemia Karen [name] has hyperlipidemia and has been trying to improve her cholesterol levels with intensive lifestyle modification including a low saturated fat diet, exercise and weight loss. She denies any chest pain, claudication or myalgias.  She is currently taking Lipitor.  Lab Results  Component Value Date   ALT 15 04/13/2019   AST 15 04/13/2019   ALKPHOS 96 04/13/2019   BILITOT 0.4 04/13/2019   Lab Results  Component Value Date   CHOL 144 04/13/2019   HDL 56 04/13/2019   LDLCALC 74 04/13/2019   TRIG 70 04/13/2019    2. Vitamin D deficiency Karen [name]'s Vitamin D level was 31.9 on 04/13/2019. She is currently taking vit D. She denies nausea, vomiting or muscle weakness.  3. At risk for heart disease Karen [name] is at a higher than average risk for cardiovascular disease due to obesity. Reviewed: no chest pain on exertion, no dyspnea on exertion, and no swelling of ankles.  Assessment/Plan:   1. Hyperlipidemia Cardiovascular risk and specific lipid/LDL goals reviewed.  We discussed several lifestyle modifications today and Shaghayegh will continue to work on diet, exercise and weight loss efforts. Orders and follow up as documented in patient record.   Counseling Intensive  lifestyle modifications are the first line treatment for this issue. . Dietary changes: Increase soluble fiber. Decrease simple carbohydrates. . Exercise changes: Moderate to vigorous-intensity aerobic activity 150 minutes per week if tolerated. . Lipid-lowering medications: see documented in medical record. - atorvastatin (LIPITOR) 10 MG tablet; Take 1 tablet (10 mg total) by mouth daily.  Dispense: 90 tablet; Refill: 0  2. Vitamin D deficiency Low Vitamin D level contributes to fatigue and are associated with obesity, breast, and colon cancer. She agrees to continue to take prescription Vitamin D @50 ,000 IU every week and will follow-up for routine testing of Vitamin D, at least 2-3 times per year to avoid over-replacement. - Vitamin D, Ergocalciferol, (DRISDOL) 1.25 MG (50000 UNIT) CAPS capsule; Take 1 capsule (50,000 Units total) by mouth every 7 (seven) days.  Dispense: 4 capsule; Refill: 0  3. At risk for heart disease Audryanna was given approximately 15 minutes of coronary artery disease prevention counseling today. She is 54 y.o. female and has risk factors for heart disease including obesity. We discussed intensive lifestyle modifications today with an emphasis on specific weight loss instructions and strategies.   Repetitive spaced learning was employed today to elicit superior memory formation and behavioral change.  4. Class 3 severe obesity with serious comorbidity and body mass index (BMI) of 45.0 to 49.9 in adult, unspecified obesity type (HCC) Karen [name] is currently in the action stage of change. As such, her goal is to continue with weight loss efforts. She has agreed to the Category  4 Plan.   Exercise goals: For substantial health benefits, adults should do at least 150 minutes (2 hours and 30 minutes) a week of moderate-intensity, or 75 minutes (1 hour and 15 minutes) a week of vigorous-intensity aerobic physical activity, or an equivalent combination of moderate- and vigorous-intensity  aerobic activity. Aerobic activity should be performed in episodes of at least 10 minutes, and preferably, it should be spread throughout the week. Adults should also include muscle-strengthening activities that involve all major muscle groups on 2 or more days a week.  Behavioral modification strategies: meal planning and cooking strategies and keeping healthy foods in the home.  Karen [name] has agreed to follow-up with our clinic in 2 weeks. She was informed of the importance of frequent follow-up visits to maximize her success with intensive lifestyle modifications for her multiple health conditions.   Objective:   Blood pressure 97/66, pulse 88, temperature 98.3 F (36.8 C), temperature source Oral, height 5\' 6"  (1.676 m), weight 292 lb (132.5 kg), SpO2 98 %. Body mass index is 47.13 kg/m.  General: Cooperative, alert, well developed, in no acute distress. HEENT: Conjunctivae and lids unremarkable. Cardiovascular: Regular rhythm.  Lungs: Normal work of breathing. Neurologic: No focal deficits.   Lab Results  Component Value Date   CREATININE 0.66 04/13/2019   BUN 18 04/13/2019   NA 140 04/13/2019   K 4.7 04/13/2019   CL 100 04/13/2019   CO2 29 04/13/2019   Lab Results  Component Value Date   ALT 15 04/13/2019   AST 15 04/13/2019   ALKPHOS 96 04/13/2019   BILITOT 0.4 04/13/2019   Lab Results  Component Value Date   HGBA1C 6.3 (H) 04/13/2019   Lab Results  Component Value Date   INSULIN 15.1 12/16/2018   Lab Results  Component Value Date   TSH 1.360 12/16/2018   Lab Results  Component Value Date   CHOL 144 04/13/2019   HDL 56 04/13/2019   LDLCALC 74 04/13/2019   TRIG 70 04/13/2019   Lab Results  Component Value Date   WBC 7.2 04/13/2019   HGB 13.3 04/13/2019   HCT 40.4 04/13/2019   MCV 76 (L) 04/13/2019   PLT 320 04/13/2019   Attestation Statements:   Reviewed by clinician on day of visit: allergies, medications, problem list, medical history, surgical  history, family history, social history, and previous encounter notes.  I, Water quality scientist, CMA, am acting as transcriptionist for Abby Potash, PA-C  I have reviewed the above documentation for accuracy and completeness, and I agree with the above. Abby Potash, PA-C

## 2019-06-03 MED ORDER — ATORVASTATIN CALCIUM 10 MG PO TABS: 10.0000 mg | ORAL_TABLET | Freq: Every day | ORAL | 0 refills | Status: DC

## 2019-06-17 ENCOUNTER — Ambulatory Visit (INDEPENDENT_AMBULATORY_CARE_PROVIDER_SITE_OTHER): Payer: BC Managed Care – PPO | Admitting: Physician Assistant

## 2019-06-24 ENCOUNTER — Other Ambulatory Visit: Payer: Self-pay

## 2019-06-24 ENCOUNTER — Encounter (INDEPENDENT_AMBULATORY_CARE_PROVIDER_SITE_OTHER): Payer: Self-pay | Admitting: Physician Assistant

## 2019-06-24 ENCOUNTER — Ambulatory Visit (INDEPENDENT_AMBULATORY_CARE_PROVIDER_SITE_OTHER): Payer: BC Managed Care – PPO | Admitting: Physician Assistant

## 2019-06-24 VITALS — BP 120/80 | HR 92 | Temp 98.4°F | Ht 66.0 in | Wt 289.0 lb

## 2019-06-24 DIAGNOSIS — Z6841 Body Mass Index (BMI) 40.0 and over, adult: Secondary | ICD-10-CM

## 2019-06-24 DIAGNOSIS — Z9189 Other specified personal risk factors, not elsewhere classified: Secondary | ICD-10-CM | POA: Diagnosis not present

## 2019-06-24 DIAGNOSIS — E559 Vitamin D deficiency, unspecified: Secondary | ICD-10-CM

## 2019-06-24 DIAGNOSIS — E1165 Type 2 diabetes mellitus with hyperglycemia: Secondary | ICD-10-CM | POA: Diagnosis not present

## 2019-06-24 MED ORDER — VITAMIN D (ERGOCALCIFEROL) 1.25 MG (50000 UNIT) PO CAPS: 50000.0000 [IU] | ORAL_CAPSULE | ORAL | 0 refills | Status: DC

## 2019-06-24 MED ORDER — VICTOZA 18 MG/3ML ~~LOC~~ SOPN: 1.8000 mg | PEN_INJECTOR | Freq: Every day | SUBCUTANEOUS | 0 refills | Status: DC

## 2019-06-24 NOTE — Progress Notes (Signed)
Chief Complaint:   OBESITY Karen [name] is here to discuss her progress with her obesity treatment plan along with follow-up of her obesity related diagnoses. Karen [name] is on the Category 4 Plan and states she is following her eating plan approximately 85-90% of the time. Karen [name] states she is walking and doing light weight training for 30 minutes 3 times per week.  Today's visit was #: 11 Starting weight: 295 lbs Starting date: 12/16/2018 Today's weight: 289 lbs Today's date: 06/24/2019 Total lbs lost to date: 6 lbs Total lbs lost since last in-office visit: 3 lbs  Interim History: Karen [name] has done well with the plan over the last few weeks.  She notes that her hunger is controlled with Victoza and sometimes she has a difficult time getting all of the food in.  Subjective:   1. Vitamin D deficiency Karen [name]'s Vitamin D level was 31.9 on 04/13/2019. She is currently taking vit D. She denies nausea, vomiting or muscle weakness.  Last vitamin D was not at goal.  2. Type 2 diabetes mellitus with hyperglycemia, without long-term current use of insulin (HCC) Medications reviewed. She is taking Victoza and metformin.  No nausea, vomiting, or diarrhea.  No polyphagia.  No hypoglycemia.  Lab Results  Component Value Date   HGBA1C 6.3 (H) 04/13/2019   Lab Results  Component Value Date   LDLCALC 74 04/13/2019   CREATININE 0.66 04/13/2019   Lab Results  Component Value Date   INSULIN 15.1 12/16/2018   3. At risk for osteoporosis Karen [name] is at higher risk of osteopenia and osteoporosis due to Vitamin D deficiency.   Assessment/Plan:   1. Vitamin D deficiency Low Vitamin D level contributes to fatigue and are associated with obesity, breast, and colon cancer. She agrees to continue to take prescription Vitamin D @50 ,000 IU every week and will follow-up for routine testing of Vitamin D, at least 2-3 times per year to avoid over-replacement. - Vitamin D, Ergocalciferol, (DRISDOL) 1.25 MG (50000 UNIT) CAPS  capsule; Take 1 capsule (50,000 Units total) by mouth every 7 (seven) days.  Dispense: 4 capsule; Refill: 0  2. Type 2 diabetes mellitus with hyperglycemia, without long-term current use of insulin (HCC) Good blood sugar control is important to decrease the likelihood of diabetic complications such as nephropathy, neuropathy, limb loss, blindness, coronary artery disease, and death. Intensive lifestyle modification including diet, exercise and weight loss are the first line of treatment for diabetes.  - liraglutide (VICTOZA) 18 MG/3ML SOPN; Inject 0.3 mLs (1.8 mg total) into the skin daily.  Dispense: 3 pen; Refill: 0  3. At risk for osteoporosis Karen [name] was given approximately 15 minutes of osteoporosis prevention counseling today. Karen [name] is at risk for osteopenia and osteoporosis due to her Vitamin D deficiency. She was encouraged to take her Vitamin D and follow her higher calcium diet and increase strengthening exercise to help strengthen her bones and decrease her risk of osteopenia and osteoporosis.  Repetitive spaced learning was employed today to elicit superior memory formation and behavioral change.  4. Class 3 severe obesity with serious comorbidity and body mass index (BMI) of 45.0 to 49.9 in adult, unspecified obesity type (HCC) Karen [name] is currently in the action stage of change. As such, her goal is to continue with weight loss efforts. She has agreed to the Category 4 Plan.   Exercise goals: For substantial health benefits, adults should do at least 150 minutes (2 hours and 30 minutes) a week of moderate-intensity, or 75 minutes (1 hour  and 15 minutes) a week of vigorous-intensity aerobic physical activity, or an equivalent combination of moderate- and vigorous-intensity aerobic activity. Aerobic activity should be performed in episodes of at least 10 minutes, and preferably, it should be spread throughout the week.  Behavioral modification strategies: meal planning and cooking strategies and  keeping healthy foods in the home.  Karen [name] has agreed to follow-up with our clinic in 2 weeks. She was informed of the importance of frequent follow-up visits to maximize her success with intensive lifestyle modifications for her multiple health conditions.   Objective:   Blood pressure 120/80, pulse 92, temperature 98.4 F (36.9 C), temperature source Oral, height 5\' 6"  (1.676 m), weight 289 lb (131.1 kg), SpO2 99 %. Body mass index is 46.65 kg/m.  General: Cooperative, alert, well developed, in no acute distress. HEENT: Conjunctivae and lids unremarkable. Cardiovascular: Regular rhythm.  Lungs: Normal work of breathing. Neurologic: No focal deficits.   Lab Results  Component Value Date   CREATININE 0.66 04/13/2019   BUN 18 04/13/2019   NA 140 04/13/2019   K 4.7 04/13/2019   CL 100 04/13/2019   CO2 29 04/13/2019   Lab Results  Component Value Date   ALT 15 04/13/2019   AST 15 04/13/2019   ALKPHOS 96 04/13/2019   BILITOT 0.4 04/13/2019   Lab Results  Component Value Date   HGBA1C 6.3 (H) 04/13/2019   Lab Results  Component Value Date   INSULIN 15.1 12/16/2018   Lab Results  Component Value Date   TSH 1.360 12/16/2018   Lab Results  Component Value Date   CHOL 144 04/13/2019   HDL 56 04/13/2019   LDLCALC 74 04/13/2019   TRIG 70 04/13/2019   Lab Results  Component Value Date   WBC 7.2 04/13/2019   HGB 13.3 04/13/2019   HCT 40.4 04/13/2019   MCV 76 (L) 04/13/2019   PLT 320 04/13/2019   Attestation Statements:   Reviewed by clinician on day of visit: allergies, medications, problem list, medical history, surgical history, family history, social history, and previous encounter notes.  I, Water quality scientist, CMA, am acting as Location manager for [employer], PA-C.  I have reviewed the above documentation for accuracy and completeness, and I agree with the above. Abby Potash, PA-C

## 2019-07-08 ENCOUNTER — Encounter (INDEPENDENT_AMBULATORY_CARE_PROVIDER_SITE_OTHER): Payer: Self-pay | Admitting: Physician Assistant

## 2019-07-08 ENCOUNTER — Ambulatory Visit (INDEPENDENT_AMBULATORY_CARE_PROVIDER_SITE_OTHER): Payer: BC Managed Care – PPO | Admitting: Physician Assistant

## 2019-07-08 ENCOUNTER — Other Ambulatory Visit: Payer: Self-pay

## 2019-07-08 VITALS — BP 125/85 | HR 89 | Temp 98.5°F | Ht 66.0 in | Wt 287.0 lb

## 2019-07-08 DIAGNOSIS — E119 Type 2 diabetes mellitus without complications: Secondary | ICD-10-CM

## 2019-07-08 DIAGNOSIS — Z6841 Body Mass Index (BMI) 40.0 and over, adult: Secondary | ICD-10-CM

## 2019-07-08 NOTE — Progress Notes (Signed)
Chief Complaint:   OBESITY Karen [name] is here to discuss her progress with her obesity treatment plan along with follow-up of her obesity related diagnoses. [name] is on the Category 4 Plan and states she is following her eating plan approximately 90% of the time. [name] states she is walking 30 minutes 3-4 times per week.  Today's visit was #: 12 Starting weight: 295 lbs Starting date: 12/16/2018 Today's weight: 287 lbs Today's date: 07/08/2019 Total lbs lost to date: 8 Total lbs lost since last in-office visit: 2  Interim History: Bar states that she has been substituting yogurt and cottage cheese for some of her protein at night.  Subjective:   Type 2 diabetes mellitus without complication, without long-term current use of insulin ([place]). Shawnea is on Victoza and metformin. No nausea, vomiting, or diarrhea.  Lab Results  Component Value Date   HGBA1C 6.3 (H) 04/13/2019   Lab Results  Component Value Date   LDLCALC 74 04/13/2019   CREATININE 0.66 04/13/2019   Lab Results  Component Value Date   INSULIN 15.1 12/16/2018   Assessment/Plan:   Type 2 diabetes mellitus without complication, without long-term current use of insulin ([place]). Good blood sugar control is important to decrease the likelihood of diabetic complications such as nephropathy, neuropathy, limb loss, blindness, coronary artery disease, and death. Intensive lifestyle modification including diet, exercise and weight loss are the first line of treatment for diabetes. [name] will continue her medications as directed.  Class 3 severe obesity with serious comorbidity and body mass index (BMI) of 45.0 to 49.9 in adult, unspecified obesity type ([place]).  [name] is currently in the action stage of change. As such, her goal is to continue with weight loss efforts. She has agreed to the Category 4 Plan and journal 550-700 calories + 45 grams of protein at supper.   Exercise goals: For substantial health benefits, adults  should do at least 150 minutes (2 hours and 30 minutes) a week of moderate-intensity, or 75 minutes (1 hour and 15 minutes) a week of vigorous-intensity aerobic physical activity, or an equivalent combination of moderate- and vigorous-intensity aerobic activity. Aerobic activity should be performed in episodes of at least 10 minutes, and preferably, it should be spread throughout the week. Peloton app information was given.  Behavioral modification strategies: meal planning and cooking strategies and keeping healthy foods in the home.  [name] has agreed to follow-up with our clinic in 2 weeks. She was informed of the importance of frequent follow-up visits to maximize her success with intensive lifestyle modifications for her multiple health conditions.   Objective:   Blood pressure 125/85, pulse 89, temperature 98.5 F (36.9 C), height 5\' 6"  (1.676 m), weight 287 lb (130.2 kg), SpO2 97 %. Body mass index is 46.32 kg/m.  General: Cooperative, alert, well developed, in no acute distress. HEENT: Conjunctivae and lids unremarkable. Cardiovascular: Regular rhythm.  Lungs: Normal work of breathing. Neurologic: No focal deficits.   Lab Results  Component Value Date   CREATININE 0.66 04/13/2019   BUN 18 04/13/2019   NA 140 04/13/2019   K 4.7 04/13/2019   CL 100 04/13/2019   CO2 29 04/13/2019   Lab Results  Component Value Date   ALT 15 04/13/2019   AST 15 04/13/2019   ALKPHOS 96 04/13/2019   BILITOT 0.4 04/13/2019   Lab Results  Component Value Date   HGBA1C 6.3 (H) 04/13/2019   Lab Results  Component Value Date   INSULIN 15.1 12/16/2018  Lab Results  Component Value Date   TSH 1.360 12/16/2018   Lab Results  Component Value Date   CHOL 144 04/13/2019   HDL 56 04/13/2019   LDLCALC 74 04/13/2019   TRIG 70 04/13/2019   Lab Results  Component Value Date   WBC 7.2 04/13/2019   HGB 13.3 04/13/2019   HCT 40.4 04/13/2019   MCV 76 (L) 04/13/2019   PLT 320 04/13/2019   No  results found for: IRON, TIBC, FERRITIN  Attestation Statements:   Reviewed by clinician on day of visit: allergies, medications, problem list, medical history, surgical history, family history, social history, and previous encounter notes.  Time spent on visit including pre-visit chart review and post-visit charting and care was 30 minutes.   IMichaelene Song, am acting as transcriptionist for Abby Potash, PA-C   I have reviewed the above documentation for accuracy and completeness, and I agree with the above. Abby Potash, PA-C

## 2019-07-20 ENCOUNTER — Ambulatory Visit (INDEPENDENT_AMBULATORY_CARE_PROVIDER_SITE_OTHER): Payer: BC Managed Care – PPO | Admitting: Physician Assistant

## 2019-07-28 ENCOUNTER — Ambulatory Visit (INDEPENDENT_AMBULATORY_CARE_PROVIDER_SITE_OTHER): Payer: BC Managed Care – PPO | Admitting: Physician Assistant

## 2019-08-03 ENCOUNTER — Ambulatory Visit (INDEPENDENT_AMBULATORY_CARE_PROVIDER_SITE_OTHER): Payer: BC Managed Care – PPO | Admitting: Physician Assistant

## 2019-08-18 IMAGING — MG DIGITAL SCREENING BILATERAL MAMMOGRAM WITH TOMO AND CAD
8 series · 8 of 24 positions shown · non-contrast
Comparison: Previous exam(s).

CLINICAL DATA: Screening.

EXAM:
DIGITAL SCREENING BILATERAL MAMMOGRAM WITH TOMO AND CAD

[R MLO synth-2D]
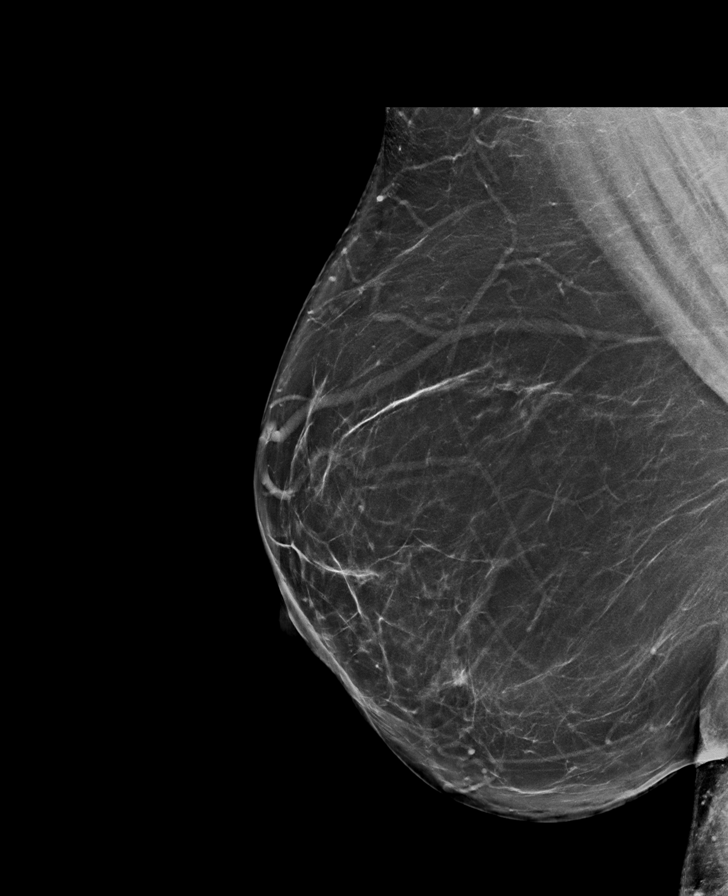

[L CC synth-2D]
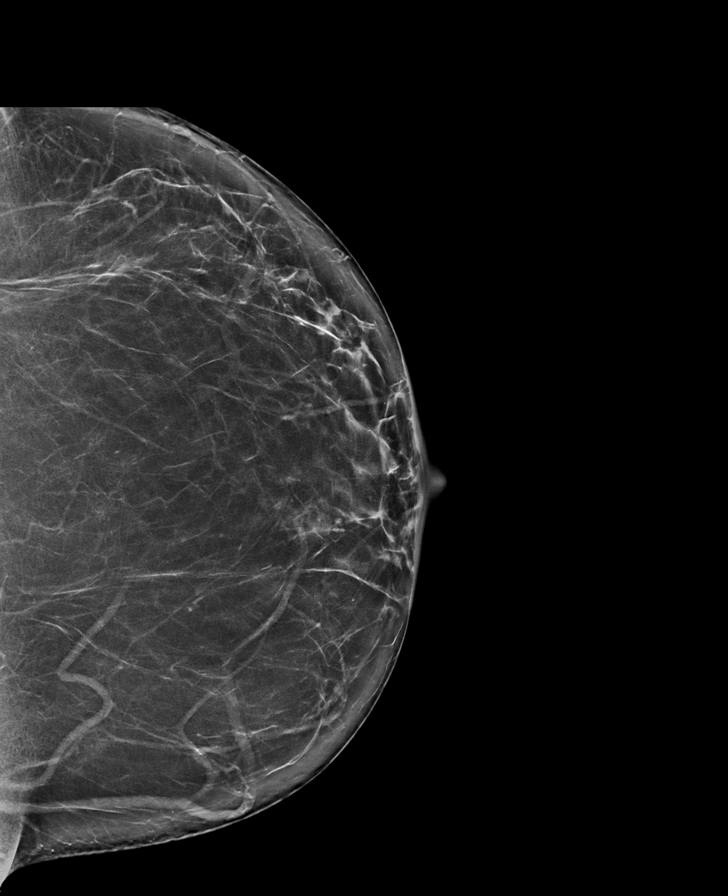

[R CC synth-2D]
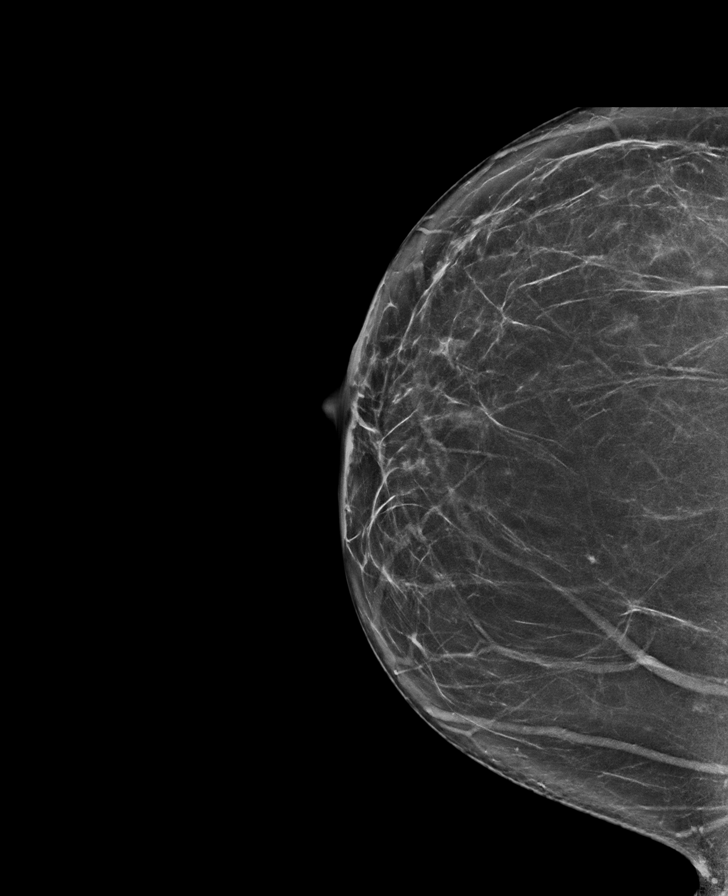

[L MLO synth-2D]
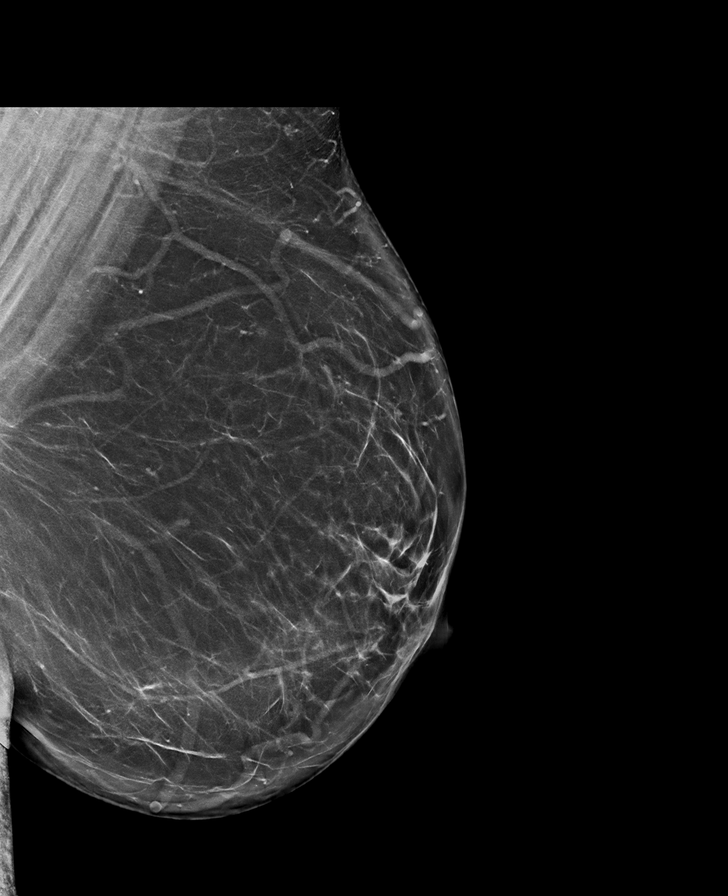

[R CC tomo · tomo slice 41/81.0]
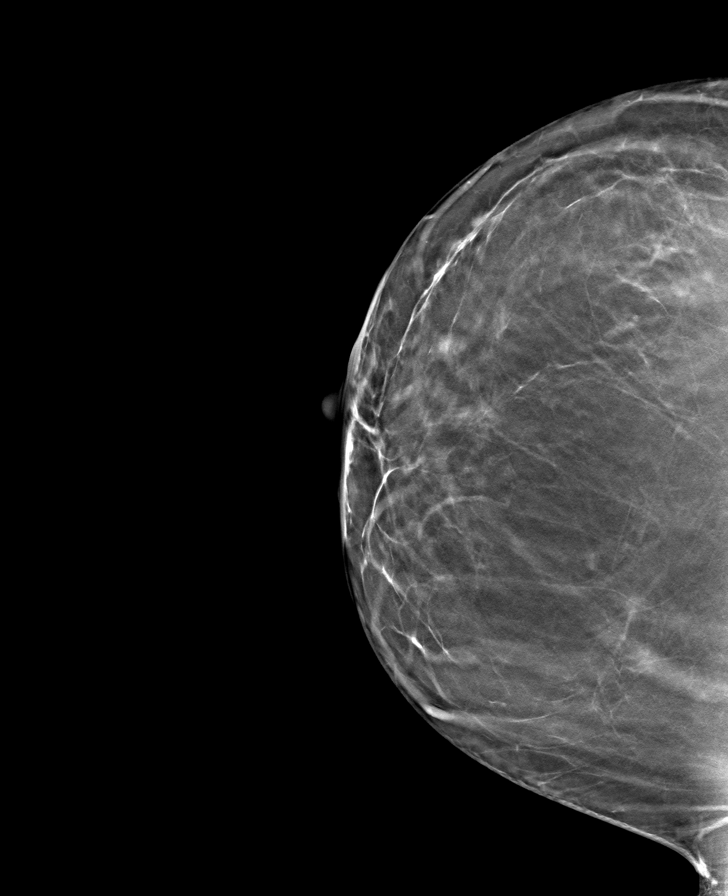

[R MLO tomo · tomo slice 49/96.0]
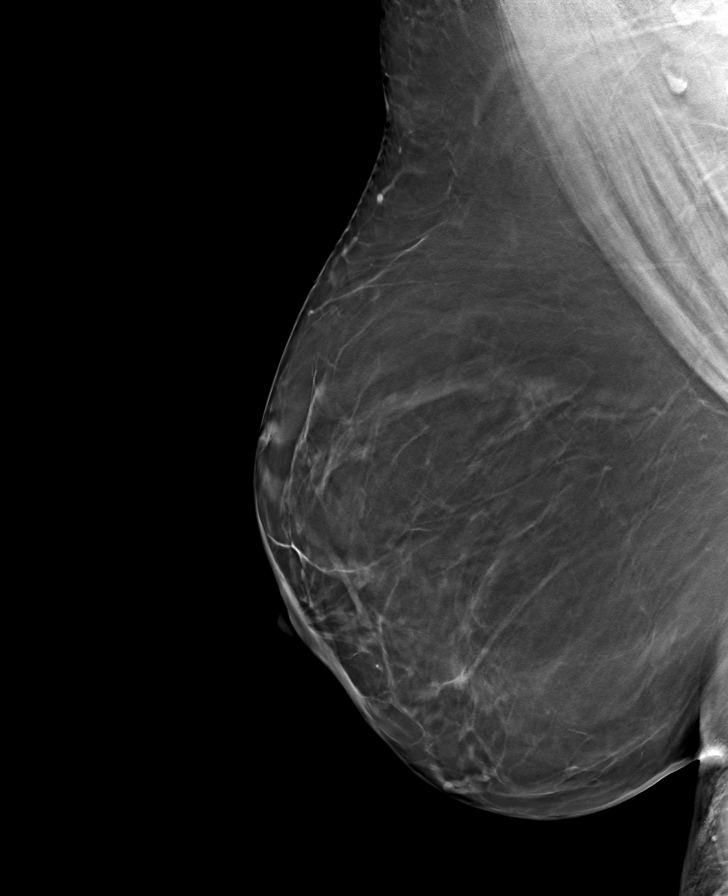

[L CC tomo · tomo slice 41/82.0]
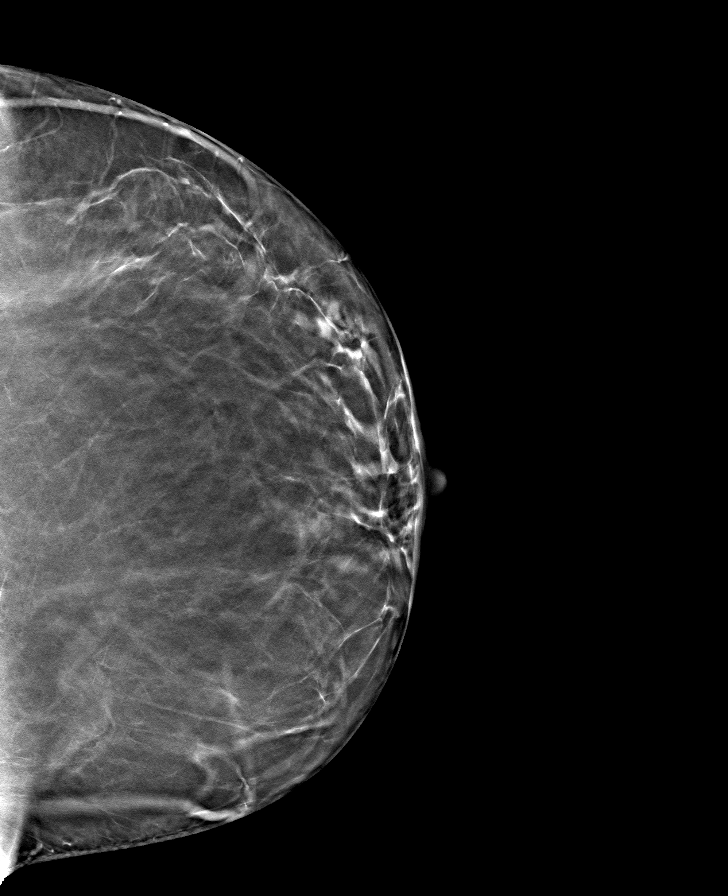

[L MLO tomo · tomo slice 47/94.0]
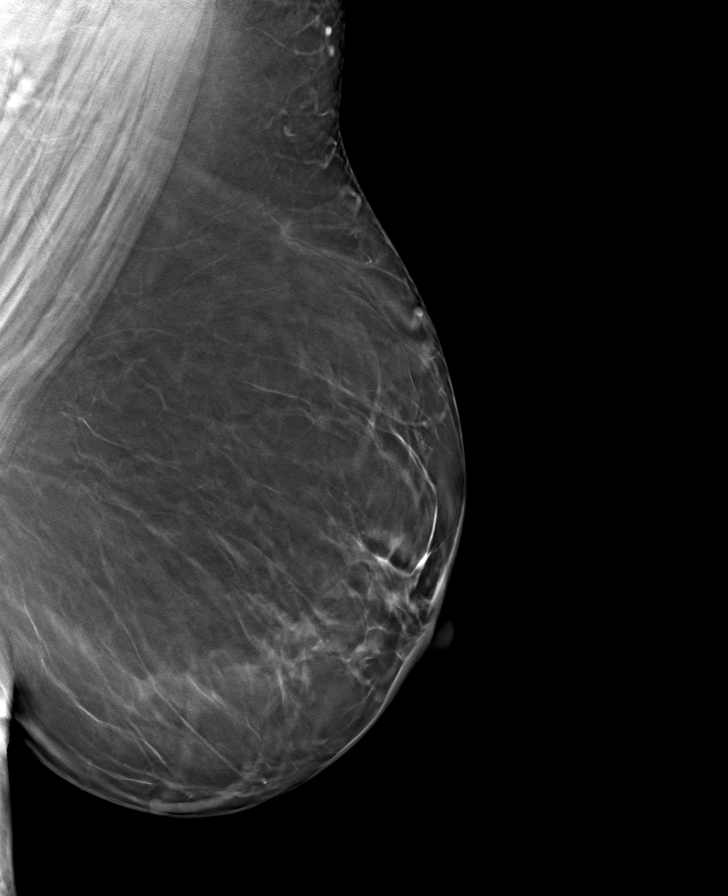

[8 of 24 positions shown; findings below may reference images not displayed]

ACR Breast Density Category b: There are scattered areas of
fibroglandular density.
FINDINGS: There are no findings suspicious for malignancy. Images were
processed with CAD.
IMPRESSION: No mammographic evidence of malignancy. A result letter of this
screening mammogram will be mailed directly to the patient.

RECOMMENDATION:
Screening mammogram in one year. (Code:[id number])

BI-RADS CATEGORY  1: Negative.

## 2019-10-07 DIAGNOSIS — E119 Type 2 diabetes mellitus without complications: Secondary | ICD-10-CM | POA: Diagnosis not present

## 2019-10-07 DIAGNOSIS — H2513 Age-related nuclear cataract, bilateral: Secondary | ICD-10-CM | POA: Diagnosis not present

## 2019-10-14 ENCOUNTER — Telehealth: Payer: Self-pay | Admitting: Internal Medicine

## 2019-10-14 DIAGNOSIS — G4733 Obstructive sleep apnea (adult) (pediatric): Secondary | ICD-10-CM

## 2019-10-14 NOTE — Telephone Encounter (Signed)
Ok to refill mask of choice and supplies as requested.

## 2019-10-14 NOTE — Telephone Encounter (Signed)
Order sent  Still awaiting call back from the pt  Will inform her when she calls back that order was sent

## 2019-10-14 NOTE — Telephone Encounter (Signed)
LMTCB for the pt--need to inform her we need approval for her request since she is overdue f/u  In the meantime will send to Dr Annamaria Boots to get approval to send CPAP supplies order  Pt last seen 2019 but has scheduled appt for 11/22/19  Please advise thanks

## 2019-11-03 DIAGNOSIS — G4733 Obstructive sleep apnea (adult) (pediatric): Secondary | ICD-10-CM | POA: Diagnosis not present

## 2019-11-17 DIAGNOSIS — E119 Type 2 diabetes mellitus without complications: Secondary | ICD-10-CM | POA: Diagnosis not present

## 2019-11-17 DIAGNOSIS — Z79899 Other long term (current) drug therapy: Secondary | ICD-10-CM | POA: Diagnosis not present

## 2019-11-17 DIAGNOSIS — K760 Fatty (change of) liver, not elsewhere classified: Secondary | ICD-10-CM | POA: Diagnosis not present

## 2019-11-17 DIAGNOSIS — E782 Mixed hyperlipidemia: Secondary | ICD-10-CM | POA: Diagnosis not present

## 2019-11-22 ENCOUNTER — Other Ambulatory Visit: Payer: Self-pay

## 2019-11-22 ENCOUNTER — Ambulatory Visit (INDEPENDENT_AMBULATORY_CARE_PROVIDER_SITE_OTHER): Payer: BC Managed Care – PPO | Admitting: Internal Medicine

## 2019-11-22 ENCOUNTER — Encounter: Payer: Self-pay | Admitting: Internal Medicine

## 2019-11-22 DIAGNOSIS — G4733 Obstructive sleep apnea (adult) (pediatric): Secondary | ICD-10-CM

## 2019-11-22 NOTE — Progress Notes (Signed)
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 -----Sleep Consult: Pt had sleep study several years ago and has been on CPAP tx for about 10 years give or take. DL attached. DME [facility]. Problem list includes morbid obesity, DM 2 Body weight today 315 pounds Epworth score 7 CPAP 10-20/[facility] Download 100% compliance AHI 2.2/hour She complains current mask is been noisy and leaking last few months.  Her machine is 52 years old.  She is noticing more daytime sleepiness, not feeling rested when she wakes up. ENT surgery-none, denies heart or lung disease.  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  ROS-see HPI   + = positive Constitutional:    weight loss, night sweats, fevers, chills, fatigue, lassitude. HEENT:    headaches, difficulty swallowing, tooth/dental problems, sore throat,       sneezing, itching, ear ache, nasal congestion, post nasal drip, snoring CV:    chest pain, orthopnea, PND, swelling in lower extremities, anasarca,                                   dizziness, palpitations Resp:   shortness of breath with exertion or at rest.                productive cough,   non-productive cough, coughing up of blood.              change in color of mucus.  wheezing.   Skin:    rash or lesions. GI:  +  heartburn, indigestion, abdominal pain, nausea, vomiting, diarrhea,                 change in bowel habits, loss of appetite GU: dysuria, change in color of  urine, no urgency or frequency.   flank pain. MS:   joint pain, stiffness, decreased range of motion, back pain. Neuro-     nothing unusual Psych:  change in mood or affect.  depression or +anxiety.   memory loss.  OBJ- Physical Exam General- Alert, Oriented, Affect-appropriate, Distress- none acute + morbidly obese Skin- rash-none, lesions- none, excoriation- none Lymphadenopathy- none Head- atraumatic            Eyes- Gross vision intact, PERRLA, conjunctivae and secretions clear            Ears- Hearing, canals-normal            Nose- Clear, no-Septal dev, mucus, polyps, erosion, perforation             Throat- Mallampati III , mucosa clear , drainage- none, tonsils+ atrophic Neck- flexible , trachea midline, no stridor , thyroid nl, carotid no bruit Chest - symmetrical excursion , unlabored           Heart/CV- RRR , no murmur , no gallop  , no rub, nl s1 s2                           - JVD- none , edema- none, stasis changes- none,  varices- none           Lung- clear to P&A, wheeze- none, cough- none , dullness-none, rub- none           Chest wall-  Abd-  Br/ Gen/ Rectal- Not done, not indicated Extrem- cyanosis- none, clubbing, none, atrophy- none, strength- nl Neuro- grossly intact to observation

## 2019-11-22 NOTE — Patient Instructions (Signed)
Order- [facility]- please continue CPAP auto 10-20, mask of choice, humidifier, supplies, AirView/ card, Chin strap Please provide current download  Please call if we can help

## 2020-01-10 DIAGNOSIS — R109 Unspecified abdominal pain: Secondary | ICD-10-CM | POA: Diagnosis not present

## 2020-01-10 DIAGNOSIS — R112 Nausea with vomiting, unspecified: Secondary | ICD-10-CM | POA: Diagnosis not present

## 2020-01-10 DIAGNOSIS — R140 Abdominal distension (gaseous): Secondary | ICD-10-CM | POA: Diagnosis not present

## 2020-01-10 DIAGNOSIS — R197 Diarrhea, unspecified: Secondary | ICD-10-CM | POA: Diagnosis not present

## 2020-02-13 NOTE — Assessment & Plan Note (Signed)
Benefits from CPAP Plan- continue auto 10-20

## 2020-02-13 NOTE — Assessment & Plan Note (Signed)
Encourage diet and exercise. Consider [facility], Bariatric consult

## 2020-02-29 DIAGNOSIS — E119 Type 2 diabetes mellitus without complications: Secondary | ICD-10-CM | POA: Diagnosis not present

## 2020-02-29 DIAGNOSIS — R197 Diarrhea, unspecified: Secondary | ICD-10-CM | POA: Diagnosis not present

## 2020-02-29 DIAGNOSIS — R112 Nausea with vomiting, unspecified: Secondary | ICD-10-CM | POA: Diagnosis not present

## 2020-02-29 DIAGNOSIS — R140 Abdominal distension (gaseous): Secondary | ICD-10-CM | POA: Diagnosis not present

## 2020-02-29 DIAGNOSIS — R109 Unspecified abdominal pain: Secondary | ICD-10-CM | POA: Diagnosis not present

## 2020-03-02 ENCOUNTER — Other Ambulatory Visit: Payer: Self-pay | Admitting: Nurse Practitioner

## 2020-03-02 DIAGNOSIS — Z1231 Encounter for screening mammogram for malignant neoplasm of breast: Secondary | ICD-10-CM

## 2020-03-15 DIAGNOSIS — Z13228 Encounter for screening for other metabolic disorders: Secondary | ICD-10-CM | POA: Diagnosis not present

## 2020-03-15 DIAGNOSIS — Z6841 Body Mass Index (BMI) 40.0 and over, adult: Secondary | ICD-10-CM | POA: Diagnosis not present

## 2020-03-15 DIAGNOSIS — F329 Major depressive disorder, single episode, unspecified: Secondary | ICD-10-CM | POA: Diagnosis not present

## 2020-03-15 DIAGNOSIS — E559 Vitamin D deficiency, unspecified: Secondary | ICD-10-CM | POA: Diagnosis not present

## 2020-03-15 DIAGNOSIS — E119 Type 2 diabetes mellitus without complications: Secondary | ICD-10-CM | POA: Diagnosis not present

## 2020-03-15 DIAGNOSIS — E782 Mixed hyperlipidemia: Secondary | ICD-10-CM | POA: Diagnosis not present

## 2020-03-24 DIAGNOSIS — R110 Nausea: Secondary | ICD-10-CM | POA: Diagnosis not present

## 2020-03-24 DIAGNOSIS — K5900 Constipation, unspecified: Secondary | ICD-10-CM | POA: Diagnosis not present

## 2020-03-24 DIAGNOSIS — R109 Unspecified abdominal pain: Secondary | ICD-10-CM | POA: Diagnosis not present

## 2020-03-31 DIAGNOSIS — R140 Abdominal distension (gaseous): Secondary | ICD-10-CM | POA: Diagnosis not present

## 2020-03-31 DIAGNOSIS — R1084 Generalized abdominal pain: Secondary | ICD-10-CM | POA: Diagnosis not present

## 2020-03-31 DIAGNOSIS — K5900 Constipation, unspecified: Secondary | ICD-10-CM | POA: Diagnosis not present

## 2020-03-31 DIAGNOSIS — R197 Diarrhea, unspecified: Secondary | ICD-10-CM | POA: Diagnosis not present

## 2020-04-11 ENCOUNTER — Other Ambulatory Visit: Payer: Self-pay

## 2020-04-11 ENCOUNTER — Ambulatory Visit
Admission: RE | Admit: 2020-04-11 | Discharge: 2020-04-11 | Disposition: A | Discharge status: Home / Self Care | Payer: BC Managed Care – PPO | Source: Ambulatory Visit | Attending: Nurse Practitioner | Admitting: Nurse Practitioner

## 2020-04-11 DIAGNOSIS — Z1231 Encounter for screening mammogram for malignant neoplasm of breast: Secondary | ICD-10-CM | POA: Diagnosis not present

## 2020-05-01 DIAGNOSIS — L84 Corns and callosities: Secondary | ICD-10-CM | POA: Diagnosis not present

## 2020-05-15 DIAGNOSIS — J069 Acute upper respiratory infection, unspecified: Secondary | ICD-10-CM | POA: Diagnosis not present

## 2020-05-16 DIAGNOSIS — J069 Acute upper respiratory infection, unspecified: Secondary | ICD-10-CM | POA: Diagnosis not present

## 2020-06-14 DIAGNOSIS — G4733 Obstructive sleep apnea (adult) (pediatric): Secondary | ICD-10-CM | POA: Diagnosis not present

## 2020-06-15 DIAGNOSIS — K582 Mixed irritable bowel syndrome: Secondary | ICD-10-CM | POA: Diagnosis not present

## 2020-06-23 DIAGNOSIS — H6123 Impacted cerumen, bilateral: Secondary | ICD-10-CM | POA: Diagnosis not present

## 2020-06-23 DIAGNOSIS — L709 Acne, unspecified: Secondary | ICD-10-CM | POA: Diagnosis not present

## 2020-07-14 DIAGNOSIS — E119 Type 2 diabetes mellitus without complications: Secondary | ICD-10-CM | POA: Diagnosis not present

## 2020-07-14 DIAGNOSIS — Z0000 Encounter for general adult medical examination without abnormal findings: Secondary | ICD-10-CM | POA: Diagnosis not present

## 2020-07-14 DIAGNOSIS — R21 Rash and other nonspecific skin eruption: Secondary | ICD-10-CM | POA: Diagnosis not present

## 2020-07-14 DIAGNOSIS — K760 Fatty (change of) liver, not elsewhere classified: Secondary | ICD-10-CM | POA: Diagnosis not present

## 2020-07-24 DIAGNOSIS — L718 Other rosacea: Secondary | ICD-10-CM | POA: Diagnosis not present

## 2020-07-31 DIAGNOSIS — N950 Postmenopausal bleeding: Secondary | ICD-10-CM | POA: Diagnosis not present

## 2020-07-31 DIAGNOSIS — R319 Hematuria, unspecified: Secondary | ICD-10-CM | POA: Diagnosis not present

## 2020-08-01 DIAGNOSIS — N950 Postmenopausal bleeding: Secondary | ICD-10-CM | POA: Diagnosis not present

## 2020-08-21 DIAGNOSIS — K5900 Constipation, unspecified: Secondary | ICD-10-CM | POA: Diagnosis not present

## 2020-08-21 DIAGNOSIS — K582 Mixed irritable bowel syndrome: Secondary | ICD-10-CM | POA: Diagnosis not present

## 2020-08-21 DIAGNOSIS — R109 Unspecified abdominal pain: Secondary | ICD-10-CM | POA: Diagnosis not present

## 2020-08-21 DIAGNOSIS — R197 Diarrhea, unspecified: Secondary | ICD-10-CM | POA: Diagnosis not present

## 2020-08-22 DIAGNOSIS — N950 Postmenopausal bleeding: Secondary | ICD-10-CM | POA: Diagnosis not present

## 2020-08-22 DIAGNOSIS — B351 Tinea unguium: Secondary | ICD-10-CM | POA: Diagnosis not present

## 2020-09-27 DIAGNOSIS — R632 Polyphagia: Secondary | ICD-10-CM | POA: Diagnosis not present

## 2020-09-27 DIAGNOSIS — Z6841 Body Mass Index (BMI) 40.0 and over, adult: Secondary | ICD-10-CM | POA: Diagnosis not present

## 2020-10-05 DIAGNOSIS — L718 Other rosacea: Secondary | ICD-10-CM | POA: Diagnosis not present

## 2020-11-01 DIAGNOSIS — R110 Nausea: Secondary | ICD-10-CM | POA: Diagnosis not present

## 2020-11-01 DIAGNOSIS — K219 Gastro-esophageal reflux disease without esophagitis: Secondary | ICD-10-CM | POA: Diagnosis not present

## 2020-11-01 DIAGNOSIS — K5900 Constipation, unspecified: Secondary | ICD-10-CM | POA: Diagnosis not present

## 2020-11-06 DIAGNOSIS — Z6841 Body Mass Index (BMI) 40.0 and over, adult: Secondary | ICD-10-CM | POA: Diagnosis not present

## 2020-11-16 DIAGNOSIS — H8110 Benign paroxysmal vertigo, unspecified ear: Secondary | ICD-10-CM | POA: Diagnosis not present

## 2020-11-16 DIAGNOSIS — R42 Dizziness and giddiness: Secondary | ICD-10-CM | POA: Diagnosis not present

## 2021-01-08 DIAGNOSIS — Z6841 Body Mass Index (BMI) 40.0 and over, adult: Secondary | ICD-10-CM | POA: Diagnosis not present

## 2021-01-23 DIAGNOSIS — G4733 Obstructive sleep apnea (adult) (pediatric): Secondary | ICD-10-CM | POA: Diagnosis not present

## 2021-02-12 DIAGNOSIS — F5081 Binge eating disorder: Secondary | ICD-10-CM | POA: Diagnosis not present

## 2021-02-12 DIAGNOSIS — Z6841 Body Mass Index (BMI) 40.0 and over, adult: Secondary | ICD-10-CM | POA: Diagnosis not present

## 2021-03-01 ENCOUNTER — Other Ambulatory Visit: Payer: Self-pay | Admitting: Nurse Practitioner

## 2021-03-01 DIAGNOSIS — Z1231 Encounter for screening mammogram for malignant neoplasm of breast: Secondary | ICD-10-CM

## 2021-03-06 DIAGNOSIS — E782 Mixed hyperlipidemia: Secondary | ICD-10-CM | POA: Diagnosis not present

## 2021-03-06 DIAGNOSIS — E119 Type 2 diabetes mellitus without complications: Secondary | ICD-10-CM | POA: Diagnosis not present

## 2021-03-08 DIAGNOSIS — K760 Fatty (change of) liver, not elsewhere classified: Secondary | ICD-10-CM | POA: Diagnosis not present

## 2021-03-08 DIAGNOSIS — E119 Type 2 diabetes mellitus without complications: Secondary | ICD-10-CM | POA: Diagnosis not present

## 2021-03-08 DIAGNOSIS — E782 Mixed hyperlipidemia: Secondary | ICD-10-CM | POA: Diagnosis not present

## 2021-03-20 DIAGNOSIS — Z6841 Body Mass Index (BMI) 40.0 and over, adult: Secondary | ICD-10-CM | POA: Diagnosis not present

## 2021-03-22 DIAGNOSIS — R110 Nausea: Secondary | ICD-10-CM | POA: Diagnosis not present

## 2021-03-22 DIAGNOSIS — K5900 Constipation, unspecified: Secondary | ICD-10-CM | POA: Diagnosis not present

## 2021-03-22 DIAGNOSIS — K219 Gastro-esophageal reflux disease without esophagitis: Secondary | ICD-10-CM | POA: Diagnosis not present

## 2021-03-22 DIAGNOSIS — K582 Mixed irritable bowel syndrome: Secondary | ICD-10-CM | POA: Diagnosis not present

## 2021-04-11 DIAGNOSIS — F5081 Binge eating disorder: Secondary | ICD-10-CM | POA: Diagnosis not present

## 2021-04-12 ENCOUNTER — Ambulatory Visit
Admission: RE | Admit: 2021-04-12 | Discharge: 2021-04-12 | Disposition: A | Discharge status: Home / Self Care | Payer: BC Managed Care – PPO | Source: Ambulatory Visit | Attending: Nurse Practitioner | Admitting: Nurse Practitioner

## 2021-04-12 DIAGNOSIS — Z1231 Encounter for screening mammogram for malignant neoplasm of breast: Secondary | ICD-10-CM

## 2021-04-18 DIAGNOSIS — F5081 Binge eating disorder: Secondary | ICD-10-CM | POA: Diagnosis not present

## 2021-04-25 DIAGNOSIS — F5081 Binge eating disorder: Secondary | ICD-10-CM | POA: Diagnosis not present

## 2021-05-02 DIAGNOSIS — K625 Hemorrhage of anus and rectum: Secondary | ICD-10-CM | POA: Diagnosis not present

## 2021-05-02 DIAGNOSIS — F419 Anxiety disorder, unspecified: Secondary | ICD-10-CM | POA: Diagnosis not present

## 2021-05-02 DIAGNOSIS — K219 Gastro-esophageal reflux disease without esophagitis: Secondary | ICD-10-CM | POA: Diagnosis not present

## 2021-05-02 DIAGNOSIS — Z79899 Other long term (current) drug therapy: Secondary | ICD-10-CM | POA: Diagnosis not present

## 2021-05-02 DIAGNOSIS — K648 Other hemorrhoids: Secondary | ICD-10-CM | POA: Diagnosis not present

## 2021-05-02 DIAGNOSIS — D125 Benign neoplasm of sigmoid colon: Secondary | ICD-10-CM | POA: Diagnosis not present

## 2021-05-02 DIAGNOSIS — Z8719 Personal history of other diseases of the digestive system: Secondary | ICD-10-CM | POA: Diagnosis not present

## 2021-05-02 DIAGNOSIS — E559 Vitamin D deficiency, unspecified: Secondary | ICD-10-CM | POA: Diagnosis not present

## 2021-05-02 DIAGNOSIS — Z882 Allergy status to sulfonamides status: Secondary | ICD-10-CM | POA: Diagnosis not present

## 2021-05-02 DIAGNOSIS — E785 Hyperlipidemia, unspecified: Secondary | ICD-10-CM | POA: Diagnosis not present

## 2021-05-02 DIAGNOSIS — K589 Irritable bowel syndrome without diarrhea: Secondary | ICD-10-CM | POA: Diagnosis not present

## 2021-05-02 DIAGNOSIS — E119 Type 2 diabetes mellitus without complications: Secondary | ICD-10-CM | POA: Diagnosis not present

## 2021-05-02 DIAGNOSIS — G4733 Obstructive sleep apnea (adult) (pediatric): Secondary | ICD-10-CM | POA: Diagnosis not present

## 2021-05-02 DIAGNOSIS — K649 Unspecified hemorrhoids: Secondary | ICD-10-CM | POA: Diagnosis not present

## 2021-05-02 DIAGNOSIS — Z86010 Personal history of colonic polyps: Secondary | ICD-10-CM | POA: Diagnosis not present

## 2021-05-02 DIAGNOSIS — F39 Unspecified mood [affective] disorder: Secondary | ICD-10-CM | POA: Diagnosis not present

## 2021-05-02 DIAGNOSIS — K635 Polyp of colon: Secondary | ICD-10-CM | POA: Diagnosis not present

## 2021-05-02 DIAGNOSIS — R197 Diarrhea, unspecified: Secondary | ICD-10-CM | POA: Diagnosis not present

## 2021-05-02 DIAGNOSIS — Z09 Encounter for follow-up examination after completed treatment for conditions other than malignant neoplasm: Secondary | ICD-10-CM | POA: Diagnosis not present

## 2021-05-16 DIAGNOSIS — F5081 Binge eating disorder: Secondary | ICD-10-CM | POA: Diagnosis not present

## 2021-05-23 DIAGNOSIS — F5081 Binge eating disorder: Secondary | ICD-10-CM | POA: Diagnosis not present

## 2021-06-06 DIAGNOSIS — F5081 Binge eating disorder: Secondary | ICD-10-CM | POA: Diagnosis not present

## 2021-06-12 DIAGNOSIS — F5081 Binge eating disorder: Secondary | ICD-10-CM | POA: Diagnosis not present

## 2021-06-13 DIAGNOSIS — F5081 Binge eating disorder: Secondary | ICD-10-CM | POA: Diagnosis not present

## 2021-06-19 DIAGNOSIS — F5081 Binge eating disorder: Secondary | ICD-10-CM | POA: Diagnosis not present

## 2021-06-20 DIAGNOSIS — F429 Obsessive-compulsive disorder, unspecified: Secondary | ICD-10-CM | POA: Diagnosis not present

## 2021-06-27 DIAGNOSIS — F5081 Binge eating disorder: Secondary | ICD-10-CM | POA: Diagnosis not present

## 2021-07-03 DIAGNOSIS — F5081 Binge eating disorder: Secondary | ICD-10-CM | POA: Diagnosis not present

## 2021-07-04 DIAGNOSIS — F5081 Binge eating disorder: Secondary | ICD-10-CM | POA: Diagnosis not present

## 2021-07-11 DIAGNOSIS — F5081 Binge eating disorder: Secondary | ICD-10-CM | POA: Diagnosis not present

## 2021-07-17 DIAGNOSIS — F5081 Binge eating disorder: Secondary | ICD-10-CM | POA: Diagnosis not present

## 2021-07-18 DIAGNOSIS — F5081 Binge eating disorder: Secondary | ICD-10-CM | POA: Diagnosis not present

## 2021-07-20 DIAGNOSIS — E119 Type 2 diabetes mellitus without complications: Secondary | ICD-10-CM | POA: Diagnosis not present

## 2021-07-20 DIAGNOSIS — Z0000 Encounter for general adult medical examination without abnormal findings: Secondary | ICD-10-CM | POA: Diagnosis not present

## 2021-07-20 DIAGNOSIS — E782 Mixed hyperlipidemia: Secondary | ICD-10-CM | POA: Diagnosis not present

## 2021-07-20 DIAGNOSIS — K219 Gastro-esophageal reflux disease without esophagitis: Secondary | ICD-10-CM | POA: Diagnosis not present

## 2021-07-20 DIAGNOSIS — Z01419 Encounter for gynecological examination (general) (routine) without abnormal findings: Secondary | ICD-10-CM | POA: Diagnosis not present

## 2021-07-24 DIAGNOSIS — G4733 Obstructive sleep apnea (adult) (pediatric): Secondary | ICD-10-CM | POA: Diagnosis not present

## 2021-07-25 DIAGNOSIS — F5081 Binge eating disorder: Secondary | ICD-10-CM | POA: Diagnosis not present

## 2021-08-01 DIAGNOSIS — F5081 Binge eating disorder: Secondary | ICD-10-CM | POA: Diagnosis not present

## 2021-08-08 DIAGNOSIS — F5081 Binge eating disorder: Secondary | ICD-10-CM | POA: Diagnosis not present

## 2021-08-22 DIAGNOSIS — F5081 Binge eating disorder: Secondary | ICD-10-CM | POA: Diagnosis not present

## 2021-08-30 DIAGNOSIS — E119 Type 2 diabetes mellitus without complications: Secondary | ICD-10-CM | POA: Diagnosis not present

## 2021-08-30 DIAGNOSIS — H2513 Age-related nuclear cataract, bilateral: Secondary | ICD-10-CM | POA: Diagnosis not present

## 2021-09-19 DIAGNOSIS — F5081 Binge eating disorder: Secondary | ICD-10-CM | POA: Diagnosis not present

## 2021-12-19 ENCOUNTER — Encounter (INDEPENDENT_AMBULATORY_CARE_PROVIDER_SITE_OTHER): Payer: Self-pay

## 2022-01-22 ENCOUNTER — Other Ambulatory Visit: Payer: Self-pay | Admitting: Nurse Practitioner

## 2022-01-22 DIAGNOSIS — Z1231 Encounter for screening mammogram for malignant neoplasm of breast: Secondary | ICD-10-CM

## 2022-02-01 DIAGNOSIS — S83242D Other tear of medial meniscus, current injury, left knee, subsequent encounter: Secondary | ICD-10-CM | POA: Diagnosis not present

## 2022-02-04 ENCOUNTER — Ambulatory Visit (INDEPENDENT_AMBULATORY_CARE_PROVIDER_SITE_OTHER): Payer: BC Managed Care – PPO | Admitting: Internal Medicine

## 2022-02-04 ENCOUNTER — Encounter (INDEPENDENT_AMBULATORY_CARE_PROVIDER_SITE_OTHER): Payer: Self-pay | Admitting: Internal Medicine

## 2022-02-04 VITALS — BP 127/77 | HR 91 | Temp 97.6°F | Ht 66.0 in | Wt 334.0 lb

## 2022-02-04 DIAGNOSIS — Z0289 Encounter for other administrative examinations: Secondary | ICD-10-CM

## 2022-02-04 DIAGNOSIS — R5383 Other fatigue: Secondary | ICD-10-CM

## 2022-02-04 DIAGNOSIS — Z6841 Body Mass Index (BMI) 40.0 and over, adult: Secondary | ICD-10-CM

## 2022-02-04 NOTE — Progress Notes (Signed)
Office: [phone]  /  Fax: [phone]  Initial Visit  Karen [name] was seen in clinic today to evaluate for obesity. 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 She has diabetes , OSA, GERD, bilateral knee pain. She has impaired mobility due to pain. She is followed by ortho who advised weight loss in anticipation to surgery. She was a former patient who is looking to reestablish care. She acknowledges benefits of frequent office visits for accountability. She has been on ozempic for 12 months. Is not a nutritional plan and is thinking of staring aqua aerobics at [employer]. She endorses knee pain, progressive weight gain and fatigue. She is on actos for diabetes.   She presents today to review program treatment options, initial physical assessment, and evaluation.      Past medical history includes:   Past Medical History:  Diagnosis Date   Anemia    iron deficiency   Anxiety    Arthritis    general -coccyx area, elbow   Back pain    Complication of anesthesia    Diabetes mellitus without complication (HCC)    Diverticulosis    Fatty liver    GERD (gastroesophageal reflux disease)    Joint pain    Knee pain    Obesity    PONV (postoperative nausea and vomiting)    drop in blood pressure(spinal anesthesia)   Sleep apnea    cpap used "automatic"     Objective:   BP 127/77   Pulse 91   Temp 97.6 F (36.4 C)   Ht '5\' 6"'$  (1.676 m)   Wt (!) 334 lb (151.5 kg)   SpO2 95%   BMI 53.91 kg/m  She was weighed on the bioimpedance scale:  Body mass index is 53.91 kg/m.  General:  Alert, oriented and cooperative. Patient is in no acute distress.  Respiratory: Normal respiratory effort, no problems with respiration noted  Extremities: Normal range of motion.    Mental Status: Normal mood and affect. Normal behavior. Normal judgment and thought content.   Assessment and Plan:   1. Class 3 severe obesity with serious comorbidity and body mass index (BMI) of 50.0 to 59.9 in adult, unspecified obesity type ([place])  2. Other fatigue - EKG 12-Lead      Obesity Treatment Plan:  She will work on garnering support from family and friends to begin weight loss journey. Work on eliminating or reducing the presence of highly processed, calorie dense foods in the home. Complete provided nutritional and psychosocial assessment questionnaire.    Karen [name] will follow up in the next 1-2 weeks to review the above steps and continue evaluation and treatment. Re-evaluate pharmacotherapy at future office visits. May benefit from increase in semaglutide and d/c of pioglitazone once she gets started on reduced calorie meal plan. Will collaborate with PCP when the time comes.   Obesity Education Performed Today:  She was weighed on the bioimpedance scale and results were discussed and documented in the synopsis.  We discussed obesity as a disease and the importance of a more detailed evaluation of all the factors contributing to the disease.  We discussed the importance of long term lifestyle changes which include nutrition, exercise and behavioral modifications as well as the importance of customizing this to her specific health and social needs.  We discussed the benefits of reaching a healthier weight to alleviate the symptoms of existing  conditions and reduce the risks of the biomechanical, metabolic and psychological effects of obesity.  We discussed the goals of this program is to improve her overall health and not simply achieve a specific BMI.  Frequent visits are very important to patient success. I plan to see her every 2 weeks for the first 3 months and then evaluate the visit frequency after that time. I explained obesity is a life-long chronic disease and long term treatments would be required. Medications to help her follow his eating plan may be offered as  appropriate but are not required. All medication decisions will be made together after the initial workup is done and benefits and side effects are discussed in depth.  The clinic rules were reviewed including the late policy, cancellation policy, no show and program fees.  Karen [name] appears to be in the action stage of change and states they are ready to start intensive lifestyle modifications and behavioral modifications.  30 minutes was spent today on this visit including the above counseling, pre-visit chart review, and post-visit documentation.  Thomes Dinning, MD

## 2022-02-09 DIAGNOSIS — M25562 Pain in left knee: Secondary | ICD-10-CM | POA: Diagnosis not present

## 2022-02-12 ENCOUNTER — Ambulatory Visit (INDEPENDENT_AMBULATORY_CARE_PROVIDER_SITE_OTHER): Payer: BC Managed Care – PPO | Admitting: Internal Medicine

## 2022-02-12 ENCOUNTER — Encounter (INDEPENDENT_AMBULATORY_CARE_PROVIDER_SITE_OTHER): Payer: Self-pay | Admitting: Internal Medicine

## 2022-02-12 VITALS — BP 124/63 | HR 81 | Temp 97.9°F | Ht 66.0 in | Wt 339.0 lb

## 2022-02-12 DIAGNOSIS — Z6841 Body Mass Index (BMI) 40.0 and over, adult: Secondary | ICD-10-CM

## 2022-02-12 DIAGNOSIS — E1169 Type 2 diabetes mellitus with other specified complication: Secondary | ICD-10-CM

## 2022-02-12 DIAGNOSIS — Z7985 Long-term (current) use of injectable non-insulin antidiabetic drugs: Secondary | ICD-10-CM

## 2022-02-12 DIAGNOSIS — E7849 Other hyperlipidemia: Secondary | ICD-10-CM

## 2022-02-12 DIAGNOSIS — E669 Obesity, unspecified: Secondary | ICD-10-CM

## 2022-02-12 DIAGNOSIS — G4733 Obstructive sleep apnea (adult) (pediatric): Secondary | ICD-10-CM

## 2022-02-12 DIAGNOSIS — R0602 Shortness of breath: Secondary | ICD-10-CM | POA: Diagnosis not present

## 2022-02-12 DIAGNOSIS — Z1331 Encounter for screening for depression: Secondary | ICD-10-CM

## 2022-02-12 DIAGNOSIS — F5081 Binge eating disorder: Secondary | ICD-10-CM

## 2022-02-12 DIAGNOSIS — R5383 Other fatigue: Secondary | ICD-10-CM | POA: Diagnosis not present

## 2022-02-12 NOTE — Progress Notes (Signed)
Chief Complaint:   OBESITY Karen [name] (MR# [id number]) is a 56 y.o. female who presents for evaluation and treatment of obesity and related comorbidities. Current BMI is Body mass index is 54.72 kg/m. Karen [name] has been struggling with her weight for many years and has been unsuccessful in either losing weight, maintaining weight loss, or reaching her healthy weight goal.  Karen [name]'s peak weight +339.  Desired weight "healthier weight".  Has a history of BED.  She was seen by a team at [facility] and is doing better.  She is currently not on nutritional  plan or engaging in regular physical activity.  Has been on Ozempic for 12 months with improved appetite and hunger signals.   Karen [name] is currently in the action stage of change and ready to dedicate time achieving and maintaining a healthier weight. Karen [name] is interested in becoming our patient and working on intensive lifestyle modifications including (but not limited to) diet and exercise for weight loss.  Karen [name]'s habits were reviewed today and are as follows: Her family eats meals together, her desired weight loss is just a healthier weight, she started gaining weight 20 years ago, her heaviest weight ever was 339 pounds, she has significant food cravings issues, she snacks frequently in the evenings, she is frequently drinking liquids with calories, she frequently makes poor food choices, she frequently eats larger portions than normal, she has binge eating behaviors, and she struggles with emotional eating.  Depression Screen Baby's Food and Mood (modified PHQ-9) score was 8.     02/12/2022    9:56 AM  Depression screen PHQ 2/9  Decreased Interest 2  Down, Depressed, Hopeless 0  PHQ - 2 Score 2  Altered sleeping 2  Tired, decreased energy 2  Change in appetite 1  Feeling bad or failure about yourself  0  Trouble concentrating 0  Moving slowly or fidgety/restless 1  Suicidal thoughts 0  PHQ-9 Score 8  Difficult doing work/chores  Somewhat difficult   Subjective:   1. Other fatigue Karen [name] admits to daytime somnolence and admits to waking up still tired. Patient has a history of symptoms of daytime fatigue and morning fatigue. Sindi generally gets 4 or 5 hours of sleep per night, and states that she has nightime awakenings. Snoring is not present. Apneic episodes are not present. Epworth Sleepiness Score is 14.  Discussed labs with patient today.     2. SOBOE (shortness of breath on exertion) Karen [name] notes increasing shortness of breath with exercising and seems to be worsening over time with weight gain. She notes getting out of breath sooner with activity than she used to. This has gotten worse recently. Karen [name] denies shortness of breath at rest or orthopnea. Discussed labs with patient today. Due to restrictive biomechanics and adiposity, ECG with baseline artifact but no acute ischemic changes.  IC 2794, Calculated 2216, VO2 404.  3. Type 2 diabetes mellitus with other specified complication, without long-term current use of insulin ([place]) Discussed labs with patient today. A1c 6.3.  No signs or symptoms of high or low.  She is on metformin, Ozempic and Actos. ([facility])  4. OSA on CPAP On CPAP with good compliance.  Increase Epworth.   5. Other hyperlipidemia Discussed labs with patient today. LDL 123, 07/[facility]21 at [facility].   She is off statin.   6. Binge eating disorder Per history.  She has had therapy in the past.  [facility], improved.   Assessment/Plan:   1. Other fatigue Karen [name] does  feel that her weight is causing her energy to be lower than it should be. Fatigue may be related to obesity, depression or many other causes. Labs will be ordered, and in the meanwhile, Allaya will focus on self care including making healthy food choices, increasing physical activity and focusing on stress reduction.   Check labs  - Vitamin B12 - CBC with Differential/Platelet - Comprehensive metabolic panel - Hemoglobin A1c -  Insulin, random - Lipid panel - T4, free - TSH - VITAMIN D 25 Hydroxy (Vit-D Deficiency, Fractures)  2. SOBOE (shortness of breath on exertion) Karen [name] does feel that she gets out of breath more easily that she used to when she exercises. Ayzia's shortness of breath appears to be obesity related and exercise induced. She has agreed to work on weight loss and gradually increase exercise to treat her exercise induced shortness of breath. Will continue to monitor closely. Weight loss therapy and generalized conditioning.  Check  IC.   3. Type 2 diabetes mellitus with other specified complication, without long-term current use of insulin (Karen [name]) Start low carbohydrate, high protein, reduced calorie meal plan.  Monitor for hypoglycemias.  Consider an increase to Ozempic 2 mg and discontinue Actos as as medication may contribute to weight gain.  Check labs  - Hemoglobin A1c - Insulin, random  4. OSA on CPAP Continue CPAP, 15% total weight loss would improve condition.  Weight loss therapy.   5. Other hyperlipidemia Check labs and assess ASCVD risk and stress the use of a statin.   The 10-year ASCVD risk score (Arnett DK, et al., 2019) is: 2.5%   Values used to calculate the score:     Age: 81 years     Sex: Female     Is Non-Hispanic African American: No     Diabetic: Yes     Tobacco smoker: No     Systolic Blood Pressure: 779 mmHg     Is BP treated: No     HDL Cholesterol: 52 mg/dL     Total Cholesterol: 149 mg/dL  - Lipid panel  6. Binge eating disorder Weight loss therapy, behavioral support.  Consider referral to Dr Mallie Mussel if matters deteriorate.   7. Depression screening Karen [name] had a positive depression screening. Depression is commonly associated with obesity and often results in emotional eating behaviors. We will monitor this closely and work on CBT to help improve the non-hunger eating patterns. Referral to Psychology may be required if no improvement is seen as she continues in  our clinic.  8. Obesity, current BMI 54.7 Karen [name] is currently in the action stage of change and her goal is to continue with weight loss efforts. I recommend Karen [name] begin the structured treatment plan as follows:  Reduced calorie high protein meal plan.  Continue Ozempic.  Consider discontinuing Actos due to weight gain.  May also increase GLP-1 in the future.   She has agreed to the Category 4 Plan.  Exercise goals: No exercise has been prescribed at this time.   Behavioral modification strategies: increasing lean protein intake, decreasing simple carbohydrates, increasing high fiber foods, decreasing eating out, no skipping meals, keeping healthy foods in the home, avoiding temptations, and planning for success.  She was informed of the importance of frequent follow-up visits to maximize her success with intensive lifestyle modifications for her multiple health conditions. She was informed we would discuss her lab results at her next visit unless there is a critical issue that needs to be addressed sooner. Karen [name] agreed to  keep her next visit at the agreed upon time to discuss these results.  Objective:   Blood pressure 124/63, pulse 81, temperature 97.9 F (36.6 C), height '5\' 6"'$  (1.676 m), weight (!) 339 lb (153.8 kg), last menstrual period 07/16/2016, SpO2 97 %. Body mass index is 54.72 kg/m.  EKG: Normal sinus rhythm, rate 78.  Indirect Calorimeter completed today shows a VO2 of 404 and a REE of 2794.  Her calculated basal metabolic rate is 1224 thus her basal metabolic rate is better than expected.  General: Cooperative, alert, well developed, in no acute distress. HEENT: Conjunctivae and lids unremarkable. Cardiovascular: Regular rhythm.  Lungs: Normal work of breathing. Neurologic: No focal deficits.   Lab Results  Component Value Date   CREATININE 0.66 04/13/2019   BUN 18 04/13/2019   NA 140 04/13/2019   K 4.7 04/13/2019   CL 100 04/13/2019   CO2 29 04/13/2019   Lab Results   Component Value Date   ALT 15 04/13/2019   AST 15 04/13/2019   ALKPHOS 96 04/13/2019   BILITOT 0.4 04/13/2019   Lab Results  Component Value Date   HGBA1C 6.3 (H) 04/13/2019   Lab Results  Component Value Date   INSULIN 15.1 12/16/2018   Lab Results  Component Value Date   TSH 1.360 12/16/2018   Lab Results  Component Value Date   CHOL 144 04/13/2019   HDL 56 04/13/2019   LDLCALC 74 04/13/2019   TRIG 70 04/13/2019   Lab Results  Component Value Date   WBC 7.2 04/13/2019   HGB 13.3 04/13/2019   HCT 40.4 04/13/2019   MCV 76 (L) 04/13/2019   PLT 320 04/13/2019   No results found for: "IRON", "TIBC", "FERRITIN"  Attestation Statements:   Reviewed by clinician on day of visit: allergies, medications, problem list, medical history, surgical history, family history, social history, and previous encounter notes.  Time spent on visit including pre-visit chart review and post-visit charting and care was 40 minutes.   I, Davy Pique, RMA, am acting as transcriptionist for Maudry Mayhew, MD.  I have reviewed the above documentation for accuracy and completeness, and I agree with the above. -Thomes Dinning, MD

## 2022-02-13 LAB — LIPID PANEL
Chol/HDL Ratio: 2.9 ratio (ref 0.0–4.4)
Cholesterol, Total: 149 mg/dL (ref 100–199)
HDL: 52 mg/dL (ref >39)
LDL Chol Calc (NIH): 76 mg/dL (ref 0–99)
Triglycerides: 119 mg/dL (ref 0–149)
VLDL Cholesterol Cal: 21 mg/dL (ref 5–40)

## 2022-02-13 LAB — CBC WITH DIFFERENTIAL/PLATELET
Basophils Absolute: 0.1 10*3/uL (ref 0.0–0.2)
Basos: 1 %
EOS (ABSOLUTE): 0.2 10*3/uL (ref 0.0–0.4)
Eos: 3 %
Hematocrit: 40.8 % (ref 34.0–46.6)
Hemoglobin: 13 g/dL (ref 11.1–15.9)
Immature Grans (Abs): 0 10*3/uL (ref 0.0–0.1)
Immature Granulocytes: 0 %
Lymphocytes Absolute: 1.8 10*3/uL (ref 0.7–3.1)
Lymphs: 30 %
MCH: 25.7 pg — ABNORMAL LOW (ref 26.6–33.0)
MCHC: 31.9 g/dL (ref 31.5–35.7)
MCV: 81 fL (ref 79–97)
Monocytes Absolute: 0.4 10*3/uL (ref 0.1–0.9)
Monocytes: 7 %
Neutrophils Absolute: 3.5 10*3/uL (ref 1.4–7.0)
Neutrophils: 59 %
Platelets: 220 10*3/uL (ref 150–450)
RBC: 5.06 x10E6/uL (ref 3.77–5.28)
RDW: 14.1 % (ref 11.7–15.4)
WBC: 5.9 10*3/uL (ref 3.4–10.8)

## 2022-02-13 LAB — COMPREHENSIVE METABOLIC PANEL
ALT: 14 IU/L (ref 0–32)
AST: 17 IU/L (ref 0–40)
Albumin/Globulin Ratio: 1.8 (ref 1.2–2.2)
Albumin: 4.4 g/dL (ref 3.8–4.9)
Alkaline Phosphatase: 83 IU/L (ref 44–121)
BUN/Creatinine Ratio: 25 — ABNORMAL HIGH (ref 9–23)
BUN: 18 mg/dL (ref 6–24)
Bilirubin Total: 0.3 mg/dL (ref 0.0–1.2)
CO2: 25 mmol/L (ref 20–29)
Calcium: 9.8 mg/dL (ref 8.7–10.2)
Chloride: 99 mmol/L (ref 96–106)
Creatinine, Ser: 0.72 mg/dL (ref 0.57–1.00)
Globulin, Total: 2.5 g/dL (ref 1.5–4.5)
Glucose: 115 mg/dL — ABNORMAL HIGH (ref 70–99)
Potassium: 4.3 mmol/L (ref 3.5–5.2)
Sodium: 140 mmol/L (ref 134–144)
Total Protein: 6.9 g/dL (ref 6.0–8.5)
eGFR: 99 mL/min/{1.73_m2} (ref >59)

## 2022-02-13 LAB — HEMOGLOBIN A1C
Est. average glucose Bld gHb Est-mCnc: 148 mg/dL
Hgb A1c MFr Bld: 6.8 % — ABNORMAL HIGH (ref 4.8–5.6)

## 2022-02-13 LAB — TSH: TSH: 0.988 u[IU]/mL (ref 0.450–4.500)

## 2022-02-13 LAB — VITAMIN D 25 HYDROXY (VIT D DEFICIENCY, FRACTURES): Vit D, 25-Hydroxy: 16.3 ng/mL — ABNORMAL LOW (ref 30.0–100.0)

## 2022-02-13 LAB — T4, FREE: Free T4: 1.03 ng/dL (ref 0.82–1.77)

## 2022-02-13 LAB — INSULIN, RANDOM: INSULIN: 12.5 u[IU]/mL (ref 2.6–24.9)

## 2022-02-13 LAB — VITAMIN B12: Vitamin B-12: 379 pg/mL (ref 232–1245)

## 2022-02-25 DIAGNOSIS — F5081 Binge eating disorder: Secondary | ICD-10-CM | POA: Insufficient documentation

## 2022-02-25 DIAGNOSIS — E1169 Type 2 diabetes mellitus with other specified complication: Secondary | ICD-10-CM | POA: Insufficient documentation

## 2022-02-25 DIAGNOSIS — R5383 Other fatigue: Secondary | ICD-10-CM | POA: Insufficient documentation

## 2022-02-25 DIAGNOSIS — E7849 Other hyperlipidemia: Secondary | ICD-10-CM | POA: Insufficient documentation

## 2022-02-25 DIAGNOSIS — G4733 Obstructive sleep apnea (adult) (pediatric): Secondary | ICD-10-CM | POA: Insufficient documentation

## 2022-02-25 DIAGNOSIS — E119 Type 2 diabetes mellitus without complications: Secondary | ICD-10-CM | POA: Insufficient documentation

## 2022-02-25 DIAGNOSIS — Z1331 Encounter for screening for depression: Secondary | ICD-10-CM | POA: Insufficient documentation

## 2022-02-25 DIAGNOSIS — E669 Obesity, unspecified: Secondary | ICD-10-CM | POA: Insufficient documentation

## 2022-02-25 DIAGNOSIS — R0602 Shortness of breath: Secondary | ICD-10-CM | POA: Insufficient documentation

## 2022-02-26 ENCOUNTER — Ambulatory Visit (INDEPENDENT_AMBULATORY_CARE_PROVIDER_SITE_OTHER): Payer: BC Managed Care – PPO | Admitting: Internal Medicine

## 2022-02-26 ENCOUNTER — Encounter (INDEPENDENT_AMBULATORY_CARE_PROVIDER_SITE_OTHER): Payer: Self-pay

## 2022-02-26 ENCOUNTER — Encounter (INDEPENDENT_AMBULATORY_CARE_PROVIDER_SITE_OTHER): Payer: Self-pay | Admitting: Internal Medicine

## 2022-02-26 VITALS — BP 116/81 | HR 85 | Temp 98.2°F | Ht 66.0 in | Wt 337.0 lb

## 2022-02-26 DIAGNOSIS — E669 Obesity, unspecified: Secondary | ICD-10-CM

## 2022-02-26 DIAGNOSIS — E1169 Type 2 diabetes mellitus with other specified complication: Secondary | ICD-10-CM | POA: Diagnosis not present

## 2022-02-26 DIAGNOSIS — Z7985 Long-term (current) use of injectable non-insulin antidiabetic drugs: Secondary | ICD-10-CM

## 2022-02-26 DIAGNOSIS — F5081 Binge eating disorder: Secondary | ICD-10-CM | POA: Diagnosis not present

## 2022-02-26 DIAGNOSIS — E559 Vitamin D deficiency, unspecified: Secondary | ICD-10-CM

## 2022-02-26 DIAGNOSIS — Z7984 Long term (current) use of oral hypoglycemic drugs: Secondary | ICD-10-CM

## 2022-02-26 DIAGNOSIS — F39 Unspecified mood [affective] disorder: Secondary | ICD-10-CM

## 2022-02-26 DIAGNOSIS — Z6841 Body Mass Index (BMI) 40.0 and over, adult: Secondary | ICD-10-CM

## 2022-02-26 MED ORDER — VITAMIN D (ERGOCALCIFEROL) 1.25 MG (50000 UNIT) PO CAPS: 50000.0000 [IU] | ORAL_CAPSULE | ORAL | 0 refills | Status: DC

## 2022-02-26 MED ORDER — OZEMPIC (1 MG/DOSE) 4 MG/3ML ~~LOC~~ SOPN: 1.0000 mg | PEN_INJECTOR | SUBCUTANEOUS | 0 refills | Status: DC

## 2022-03-05 NOTE — Progress Notes (Signed)
Chief Complaint:   OBESITY Karen [name] is here to discuss her progress with her obesity treatment plan along with follow-up of her obesity related diagnoses. Karen [name] is on the Category 4 Plan and states she is following her eating plan approximately 85% of the time. Karen [name] states she is doing 0 minutes 0 times per week.  Today's visit was #: 2 Starting weight: 339 lbs Starting date: 02/12/2022 Today's weight: 337 lbs Today's date: 02/26/2022 Total lbs lost to date: 2 Total lbs lost since last in-office visit: 2  Interim History: [name] is doing well. She reports adequate control. She acknowledges occasional hunger at night while watching TV, but she eats a healthy snack. She denies binge eating disorder behaviors. She has questions about calories, snacks, and GLP-1 drugs.  Subjective:   1. Type 2 diabetes mellitus with other specified complication, without long-term current use of insulin (HCC) [name]'s last A1c was 6.8. She is on Ozempic, metformin, and Actos. She denies lows. I discussed labs with the patient today.   2. Vitamin D deficiency Karen [name]'s last Vitamin D level was 16.3. She is not on Vitamin D. I discussed labs with the patient today.   3. Binge eating disorder Karen [name]'s symptoms have improved and are stable. She has adequate control over her appetite and portions.   4. Mood disorder (HCC)-unspecified Karen [name] is on TCA for OCD type features. This may contribute to weight gain.   Assessment/Plan:   1. Type 2 diabetes mellitus with other specified complication, without long-term current use of insulin ([place]) Karen [name] agreed to increase Ozempic to 1 mg once weekly, with no refills. She will continue her weight loss therapy. She was counseled on the benefits and side effects of the medication. Will discontinue Actos in the future.   - Semaglutide, 1 MG/DOSE, (OZEMPIC, 1 MG/DOSE,) 4 MG/3ML SOPN; Inject 1 mg into the skin once a week.  Dispense: 3 mL; Refill: 0  2. Vitamin D deficiency Karen [name]  agreed to start prescription Vitamin D 50,000 IU every week, with a 90 day supply. We will recheck labs for a goal of 50-60.  - Vitamin D, Ergocalciferol, (DRISDOL) 1.25 MG (50000 UNIT) CAPS capsule; Take 1 capsule (50,000 Units total) by mouth every 7 (seven) days.  Dispense: 13 capsule; Refill: 0  3. Binge eating disorder Karen [name] will continue to monitor, and will consider a referral to Dr. Mallie Mussel, our Bariatric Psychologist if symptoms worsen.  4. Mood disorder (HCC)-unspecified [name] will discuss this with her Psychiatrist about switching to weight neutral agent.   5. Obesity, current BMI 54.4 Karen [name] is currently in the action stage of change. As such, her goal is to continue with weight loss efforts. She has agreed to the Category 4 Plan.   She will continue her meal plan, increase Ozempic to 1 mg once weekly. She will talk with her Psychiatrist about switching TCA to weight neutral medication.   Exercise goals: Strength training 2-3 times per week at home with bands.   Behavioral modification strategies: increasing lean protein intake, ways to avoid night time snacking, avoiding temptations, and planning for success.  Karen [name] has agreed to follow-up with our clinic in 2 weeks. She was informed of the importance of frequent follow-up visits to maximize her success with intensive lifestyle modifications for her multiple health conditions.   Objective:   Blood pressure 116/81, pulse 85, temperature 98.2 F (36.8 C), height '5\' 6"'$  (1.676 m), weight (!) 337 lb (152.9 kg), last menstrual period 07/16/2016, SpO2 99 %. Body  mass index is 54.39 kg/m.  General: Cooperative, alert, well developed, in no acute distress. HEENT: Conjunctivae and lids unremarkable. Cardiovascular: Regular rhythm.  Lungs: Normal work of breathing. Neurologic: No focal deficits.   Lab Results  Component Value Date   CREATININE 0.72 02/12/2022   BUN 18 02/12/2022   NA 140 02/12/2022   K 4.3 02/12/2022   CL 99  02/12/2022   CO2 25 02/12/2022   Lab Results  Component Value Date   ALT 14 02/12/2022   AST 17 02/12/2022   ALKPHOS 83 02/12/2022   BILITOT 0.3 02/12/2022   Lab Results  Component Value Date   HGBA1C 6.8 (H) 02/12/2022   HGBA1C 6.3 (H) 04/13/2019   Lab Results  Component Value Date   INSULIN 12.5 02/12/2022   INSULIN 15.1 12/16/2018   Lab Results  Component Value Date   TSH 0.988 02/12/2022   Lab Results  Component Value Date   CHOL 149 02/12/2022   HDL 52 02/12/2022   LDLCALC 76 02/12/2022   TRIG 119 02/12/2022   CHOLHDL 2.9 02/12/2022   Lab Results  Component Value Date   VD25OH 16.3 (L) 02/12/2022   VD25OH 31.9 04/13/2019   VD25OH 23.3 (L) 12/16/2018   Lab Results  Component Value Date   WBC 5.9 02/12/2022   HGB 13.0 02/12/2022   HCT 40.8 02/12/2022   MCV 81 02/12/2022   PLT 220 02/12/2022   No results found for: "IRON", "TIBC", "FERRITIN"  Attestation Statements:   Reviewed by clinician on day of visit: allergies, medications, problem list, medical history, surgical history, family history, social history, and previous encounter notes.  Time spent on visit including pre-visit chart review and post-visit care and charting was 40 minutes.   Wilhemena Durie, am acting as transcriptionist for Thomes Dinning, MD.  I have reviewed the above documentation for accuracy and completeness, and I agree with the above. -Thomes Dinning, MD

## 2022-03-16 DIAGNOSIS — H109 Unspecified conjunctivitis: Secondary | ICD-10-CM | POA: Diagnosis not present

## 2022-03-16 DIAGNOSIS — H029 Unspecified disorder of eyelid: Secondary | ICD-10-CM | POA: Diagnosis not present

## 2022-03-18 ENCOUNTER — Encounter (INDEPENDENT_AMBULATORY_CARE_PROVIDER_SITE_OTHER): Payer: Self-pay | Admitting: Internal Medicine

## 2022-03-18 ENCOUNTER — Ambulatory Visit (INDEPENDENT_AMBULATORY_CARE_PROVIDER_SITE_OTHER): Payer: BC Managed Care – PPO | Admitting: Internal Medicine

## 2022-03-18 VITALS — BP 124/82 | HR 76 | Temp 97.9°F | Ht 66.0 in | Wt 341.0 lb

## 2022-03-18 DIAGNOSIS — Z6841 Body Mass Index (BMI) 40.0 and over, adult: Secondary | ICD-10-CM

## 2022-03-18 DIAGNOSIS — E669 Obesity, unspecified: Secondary | ICD-10-CM | POA: Diagnosis not present

## 2022-03-18 DIAGNOSIS — E1169 Type 2 diabetes mellitus with other specified complication: Secondary | ICD-10-CM | POA: Diagnosis not present

## 2022-03-18 DIAGNOSIS — Z7985 Long-term (current) use of injectable non-insulin antidiabetic drugs: Secondary | ICD-10-CM | POA: Diagnosis not present

## 2022-04-01 NOTE — Progress Notes (Signed)
Chief Complaint:   OBESITY Karen [name] is here to discuss her progress with her obesity treatment plan along with follow-up of her obesity related diagnoses. Karen [name] is on the Category 4 Plan and states she is following her eating plan approximately 30-50% of the time. Karen [name] states she is using resistance bands 30 minutes 4-5 times per week.  Today's visit was #: 3 Starting weight: 339 lbs Starting date: 02/12/2022 Today's weight: 341 lbs Today's date: 03/18/2022 Total lbs lost to date: 0 Total lbs lost since last in-office visit: +4  Interim History: Karen [name] reports having a difficult 2 weeks due to her mother being ill with severe shingles, access to palatable foods, and prioritizing the needs of others. Despite this, she is making healthier choices and kept her appointment. Pt identifies as an empathetic person and has problems prioritizing her health.  Subjective:   1. Type 2 diabetes mellitus with other specified complication, without long-term current use of insulin (HCC) Karen [name] is on Ozempic 1 mg and denies side effects or blood sugar lows.  Assessment/Plan:   1. Type 2 diabetes mellitus with other specified complication, without long-term current use of insulin ([place]) Continue Ozempic for 3 more weeks, then consider increasing to 2 mg a week and discontinuing Actos at next visit.  2. Obesity, current BMI 55.1 Karen [name] is currently in the action stage of change. As such, her goal is to continue with weight loss efforts. She has agreed to the Category 4 Plan.   Karen [name] is committed to making changes over the next 2 weeks and feels confident she will succeed. We counseled her on lapses vs.relapse and avoidance of the "all or nothing" mindset.  Exercise goals:  As is  Behavioral modification strategies: increasing lean protein intake, no skipping meals, meal planning and cooking strategies, emotional eating strategies, dealing with family or coworker sabotage, avoiding temptations, and planning for  success.  Karen [name] has agreed to follow-up with our clinic in 2 weeks. She was informed of the importance of frequent follow-up visits to maximize her success with intensive lifestyle modifications for her multiple health conditions.   Objective:   Blood pressure 124/82, pulse 76, temperature 97.9 F (36.6 C), height '5\' 6"'$  (1.676 m), weight (!) 341 lb (154.7 kg), last menstrual period 07/16/2016, SpO2 99 %. Body mass index is 55.04 kg/m.  General: Cooperative, alert, well developed, in no acute distress. HEENT: Conjunctivae and lids unremarkable. Cardiovascular: Regular rhythm.  Lungs: Normal work of breathing. Neurologic: No focal deficits.   Lab Results  Component Value Date   CREATININE 0.72 02/12/2022   BUN 18 02/12/2022   NA 140 02/12/2022   K 4.3 02/12/2022   CL 99 02/12/2022   CO2 25 02/12/2022   Lab Results  Component Value Date   ALT 14 02/12/2022   AST 17 02/12/2022   ALKPHOS 83 02/12/2022   BILITOT 0.3 02/12/2022   Lab Results  Component Value Date   HGBA1C 6.8 (H) 02/12/2022   HGBA1C 6.3 (H) 04/13/2019   Lab Results  Component Value Date   INSULIN 12.5 02/12/2022   INSULIN 15.1 12/16/2018   Lab Results  Component Value Date   TSH 0.988 02/12/2022   Lab Results  Component Value Date   CHOL 149 02/12/2022   HDL 52 02/12/2022   LDLCALC 76 02/12/2022   TRIG 119 02/12/2022   CHOLHDL 2.9 02/12/2022   Lab Results  Component Value Date   VD25OH 16.3 (L) 02/12/2022   VD25OH 31.9 04/13/2019   VD25OH  23.3 (L) 12/16/2018   Lab Results  Component Value Date   WBC 5.9 02/12/2022   HGB 13.0 02/12/2022   HCT 40.8 02/12/2022   MCV 81 02/12/2022   PLT 220 02/12/2022    Attestation Statements:   Reviewed by clinician on day of visit: allergies, medications, problem list, medical history, surgical history, family history, social history, and previous encounter notes.  Time spent on visit including pre-visit chart review and post-visit care and charting  was 20 minutes.   I, Kathlene November, BS, CMA, am acting as transcriptionist for Thomes Dinning, MD.  I have reviewed the above documentation for accuracy and completeness, and I agree with the above. -Thomes Dinning, MD

## 2022-04-11 ENCOUNTER — Ambulatory Visit (INDEPENDENT_AMBULATORY_CARE_PROVIDER_SITE_OTHER): Payer: BC Managed Care – PPO | Admitting: Internal Medicine

## 2022-04-11 ENCOUNTER — Encounter (INDEPENDENT_AMBULATORY_CARE_PROVIDER_SITE_OTHER): Payer: Self-pay | Admitting: Internal Medicine

## 2022-04-11 VITALS — BP 122/82 | HR 70 | Temp 97.9°F | Ht 66.0 in | Wt 333.0 lb

## 2022-04-11 DIAGNOSIS — Z6841 Body Mass Index (BMI) 40.0 and over, adult: Secondary | ICD-10-CM

## 2022-04-11 DIAGNOSIS — E669 Obesity, unspecified: Secondary | ICD-10-CM

## 2022-04-11 DIAGNOSIS — E1169 Type 2 diabetes mellitus with other specified complication: Secondary | ICD-10-CM | POA: Diagnosis not present

## 2022-04-11 DIAGNOSIS — Z7985 Long-term (current) use of injectable non-insulin antidiabetic drugs: Secondary | ICD-10-CM

## 2022-04-11 DIAGNOSIS — E559 Vitamin D deficiency, unspecified: Secondary | ICD-10-CM | POA: Diagnosis not present

## 2022-04-11 MED ORDER — SEMAGLUTIDE (2 MG/DOSE) 8 MG/3ML ~~LOC~~ SOPN: 2.0000 mg | PEN_INJECTOR | SUBCUTANEOUS | 0 refills | Status: DC

## 2022-04-15 ENCOUNTER — Ambulatory Visit
Admission: RE | Admit: 2022-04-15 | Discharge: 2022-04-15 | Disposition: A | Discharge status: Home / Self Care | Payer: BC Managed Care – PPO | Source: Ambulatory Visit | Attending: Nurse Practitioner | Admitting: Nurse Practitioner

## 2022-04-15 DIAGNOSIS — Z1231 Encounter for screening mammogram for malignant neoplasm of breast: Secondary | ICD-10-CM

## 2022-04-23 NOTE — Progress Notes (Signed)
Chief Complaint:   OBESITY Karen [name] is here to discuss her progress with her obesity treatment plan along with follow-up of her obesity related diagnoses. Karen [name] is on the Category 4 Plan and states she is following her eating plan approximately 80% of the time. Karen [name] states she is using resistance bands for 15 minutes 3 times per week.  Today's visit was #: 4 Starting weight: 339 lbs Starting date: 02/12/2022 Today's weight: 333 lbs Today's date: 04/11/2022 Total lbs lost to date: 6 Total lbs lost since last in-office visit: 8  Interim History: Karen [name] has begun to prioritize her health. She has been doing a great job with adhering to the plan. She has reduced/eliminated added sugars and switched to diet soda. She reports adequate satiety and satiation, and she reports changes in her taste and energy level.   Subjective:   1. Type 2 diabetes mellitus with other specified complication, without long-term current use of insulin (HCC) Karen [name]'s last A1c was 6.9, goal at 6.4. She is on Ozempic 1 mg and Actos. I discussed labs with the patient today.   2. Vitamin D deficiency Karen [name]'s last Vitamin D level was 16.3. She is currently taking prescription vitamin D 50,000 IU each week. She denies nausea, vomiting or muscle weakness. I discussed labs with the patient today.   Assessment/Plan:   1. Type 2 diabetes mellitus with other specified complication, without long-term current use of insulin ([place]) Kaylise agreed to discontinue Actos (obesogenous drug) and increase Ozempic to 2 mg once weekly, and we will refill for 1 month.   - Semaglutide, 2 MG/DOSE, 8 MG/3ML SOPN; Inject 2 mg as directed once a week.  Dispense: 3 mL; Refill: 0  2. Vitamin D deficiency Karen [name] will continue prescription Vitamin D2 50,000 IU every week.   3. Obesity, current BMI 53.9 Karen [name] is currently in the action stage of change. As such, her goal is to continue with weight loss efforts. She has agreed to the Category 4 Plan.    Exercise goals: As is.   Behavioral modification strategies: increasing lean protein intake, increasing water intake, no skipping meals, keeping healthy foods in the home, avoiding temptations, and planning for success.  Karen [name] has agreed to follow-up with our clinic in 2 weeks. She was informed of the importance of frequent follow-up visits to maximize her success with intensive lifestyle modifications for her multiple health conditions.   Objective:   Blood pressure 122/82, pulse 70, temperature 97.9 F (36.6 C), height '5\' 6"'$  (1.676 m), weight (!) 333 lb (151 kg), last menstrual period 07/16/2016, SpO2 97 %. Body mass index is 53.75 kg/m.  General: Cooperative, alert, well developed, in no acute distress. HEENT: Conjunctivae and lids unremarkable. Cardiovascular: Regular rhythm.  Lungs: Normal work of breathing. Neurologic: No focal deficits.   Lab Results  Component Value Date   CREATININE 0.72 02/12/2022   BUN 18 02/12/2022   NA 140 02/12/2022   K 4.3 02/12/2022   CL 99 02/12/2022   CO2 25 02/12/2022   Lab Results  Component Value Date   ALT 14 02/12/2022   AST 17 02/12/2022   ALKPHOS 83 02/12/2022   BILITOT 0.3 02/12/2022   Lab Results  Component Value Date   HGBA1C 6.8 (H) 02/12/2022   HGBA1C 6.3 (H) 04/13/2019   Lab Results  Component Value Date   INSULIN 12.5 02/12/2022   INSULIN 15.1 12/16/2018   Lab Results  Component Value Date   TSH 0.988 02/12/2022   Lab Results  Component Value Date   CHOL 149 02/12/2022   HDL 52 02/12/2022   LDLCALC 76 02/12/2022   TRIG 119 02/12/2022   CHOLHDL 2.9 02/12/2022   Lab Results  Component Value Date   VD25OH 16.3 (L) 02/12/2022   VD25OH 31.9 04/13/2019   VD25OH 23.3 (L) 12/16/2018   Lab Results  Component Value Date   WBC 5.9 02/12/2022   HGB 13.0 02/12/2022   HCT 40.8 02/12/2022   MCV 81 02/12/2022   PLT 220 02/12/2022   No results found for: "IRON", "TIBC", "FERRITIN"  Attestation Statements:    Reviewed by clinician on day of visit: allergies, medications, problem list, medical history, surgical history, family history, social history, and previous encounter notes.  Time spent on visit including pre-visit chart review and post-visit care and charting was 20 minutes.   Wilhemena Durie, am acting as transcriptionist for Thomes Dinning, MD.  I have reviewed the above documentation for accuracy and completeness, and I agree with the above. -Thomes Dinning, MD

## 2022-04-24 ENCOUNTER — Encounter (INDEPENDENT_AMBULATORY_CARE_PROVIDER_SITE_OTHER): Payer: Self-pay | Admitting: Internal Medicine

## 2022-04-24 ENCOUNTER — Ambulatory Visit (INDEPENDENT_AMBULATORY_CARE_PROVIDER_SITE_OTHER): Payer: BC Managed Care – PPO | Admitting: Internal Medicine

## 2022-04-24 VITALS — BP 123/85 | HR 87 | Temp 98.2°F | Ht 66.0 in | Wt 334.0 lb

## 2022-04-24 DIAGNOSIS — E669 Obesity, unspecified: Secondary | ICD-10-CM | POA: Diagnosis not present

## 2022-04-24 DIAGNOSIS — Z6841 Body Mass Index (BMI) 40.0 and over, adult: Secondary | ICD-10-CM

## 2022-04-24 DIAGNOSIS — E559 Vitamin D deficiency, unspecified: Secondary | ICD-10-CM | POA: Diagnosis not present

## 2022-04-24 DIAGNOSIS — Z7985 Long-term (current) use of injectable non-insulin antidiabetic drugs: Secondary | ICD-10-CM

## 2022-04-24 DIAGNOSIS — E119 Type 2 diabetes mellitus without complications: Secondary | ICD-10-CM | POA: Diagnosis not present

## 2022-04-25 NOTE — Progress Notes (Unsigned)
Chief Complaint:   OBESITY Karen [name] is here to discuss her progress with her obesity treatment plan along with follow-up of her obesity related diagnoses. Brentley is on the Category 4 Plan and states she is following her eating plan approximately 75-80% of the time. Brendan states she is using resistance bands 15 minutes 2 times per week.  Today's visit was #: 5 Starting weight: 339 lbs Starting date: 02/12/2022 Today's weight: 334 lbs Today's date: 04/24/2022 Total lbs lost to date: 5 Total lbs lost since last in-office visit: +1  Interim History: Karen [name] presents today for follow-up.  She reports adequate adherence to nutrition plan.  She has gained 1 pound since the last office visit.  We had increased her Ozempic to 2 mg a day she is currently doing 1.5 mg without any side effects.  She notes adequate satiety and satiation on meal plan.  She at times feel like is too much food.  She also acknowledges at times cravings and grazing behavior but denies problems with portion control.  Her IC was 2800, predicted is 2147.  I suspect IC may have overestimated metabolism.    Subjective:   1. Type 2 diabetes mellitus without complication, without long-term current use of insulin (HCC) Sunnie's A1c is 6.8. She denies hypoglycemia or polydipsia.  2. Vitamin D deficiency Vitamin D level 16.3. Patient is on supplementation with no side effects.  Assessment/Plan:   1. Type 2 diabetes mellitus without complication, without long-term current use of insulin (HCC) Increase Ozempic to 2 mg once a week. Continue weight loss therapy. Discontinue Actos when blood glucose  drops.  2. Vitamin D deficiency Continue Vitamin D for 3 months (January 2024) and recheck levels.  3. Obesity, current BMI 32 Karen [name] is currently in the action stage of change. As such, her goal is to continue with weight loss efforts. She has agreed to change to the Category 3 Plan with 1500 calories.   Plan: We will reduce her calories to  1500 cal or category 3 meal plan this will create about a 600-calorie deficit from her predicted BMR.  Patient was also instructed to begin journaling to ensure she is reaching target caloric zone and also for educational purposes.  We are also increasing Ozempic to 2 mg for her diabetes in hopes that we may be able to discontinue pioglitazone with improved glycemic control as this will assist with weight loss efforts.  Exercise goals: All adults should avoid inactivity. Some physical activity is better than none, and adults who participate in any amount of physical activity gain some health benefits.  Behavioral modification strategies: increasing lean protein intake, decreasing simple carbohydrates, no skipping meals, better snacking choices, and holiday eating strategies .  Jailin has agreed to follow-up with our clinic in 3 weeks. She was informed of the importance of frequent follow-up visits to maximize her success with intensive lifestyle modifications for her multiple health conditions.   Objective:   Blood pressure 123/85, pulse 87, temperature 98.2 F (36.8 C), height '5\' 6"'$  (1.676 m), weight (!) 334 lb (151.5 kg), last menstrual period 07/16/2016, SpO2 97 %. Body mass index is 53.91 kg/m.  General: Cooperative, alert, well developed, in no acute distress. HEENT: Conjunctivae and lids unremarkable. Cardiovascular: Regular rhythm.  Lungs: Normal work of breathing. Neurologic: No focal deficits.   Lab Results  Component Value Date   CREATININE 0.72 02/12/2022   BUN 18 02/12/2022   NA 140 02/12/2022   K 4.3 02/12/2022   CL  99 02/12/2022   CO2 25 02/12/2022   Lab Results  Component Value Date   ALT 14 02/12/2022   AST 17 02/12/2022   ALKPHOS 83 02/12/2022   BILITOT 0.3 02/12/2022   Lab Results  Component Value Date   HGBA1C 6.8 (H) 02/12/2022   HGBA1C 6.3 (H) 04/13/2019   Lab Results  Component Value Date   INSULIN 12.5 02/12/2022   INSULIN 15.1 12/16/2018   Lab  Results  Component Value Date   TSH 0.988 02/12/2022   Lab Results  Component Value Date   CHOL 149 02/12/2022   HDL 52 02/12/2022   LDLCALC 76 02/12/2022   TRIG 119 02/12/2022   CHOLHDL 2.9 02/12/2022   Lab Results  Component Value Date   VD25OH 16.3 (L) 02/12/2022   VD25OH 31.9 04/13/2019   VD25OH 23.3 (L) 12/16/2018   Lab Results  Component Value Date   WBC 5.9 02/12/2022   HGB 13.0 02/12/2022   HCT 40.8 02/12/2022   MCV 81 02/12/2022   PLT 220 02/12/2022     Attestation Statements:   Reviewed by clinician on day of visit: allergies, medications, problem list, medical history, surgical history, family history, social history, and previous encounter notes.  Time spent on visit including pre-visit chart review and post-visit care and charting was 20 minutes.   I, Kathlene November, BS, CMA, am acting as transcriptionist for Thomes Dinning, MD.  I have reviewed the above documentation for accuracy and completeness, and I agree with the above. -Thomes Dinning, MD

## 2022-05-07 ENCOUNTER — Other Ambulatory Visit (INDEPENDENT_AMBULATORY_CARE_PROVIDER_SITE_OTHER): Payer: Self-pay | Admitting: Internal Medicine

## 2022-05-07 DIAGNOSIS — E1169 Type 2 diabetes mellitus with other specified complication: Secondary | ICD-10-CM

## 2022-05-09 ENCOUNTER — Other Ambulatory Visit (INDEPENDENT_AMBULATORY_CARE_PROVIDER_SITE_OTHER): Payer: Self-pay | Admitting: Internal Medicine

## 2022-05-09 DIAGNOSIS — E1169 Type 2 diabetes mellitus with other specified complication: Secondary | ICD-10-CM

## 2022-05-16 ENCOUNTER — Ambulatory Visit (INDEPENDENT_AMBULATORY_CARE_PROVIDER_SITE_OTHER): Payer: BC Managed Care – PPO | Admitting: Internal Medicine

## 2022-05-16 ENCOUNTER — Encounter (INDEPENDENT_AMBULATORY_CARE_PROVIDER_SITE_OTHER): Payer: Self-pay | Admitting: Internal Medicine

## 2022-05-16 VITALS — BP 120/82 | HR 94 | Temp 97.7°F | Ht 66.0 in | Wt 326.0 lb

## 2022-05-16 DIAGNOSIS — E1169 Type 2 diabetes mellitus with other specified complication: Secondary | ICD-10-CM | POA: Diagnosis not present

## 2022-05-16 DIAGNOSIS — E559 Vitamin D deficiency, unspecified: Secondary | ICD-10-CM

## 2022-05-16 DIAGNOSIS — Z6841 Body Mass Index (BMI) 40.0 and over, adult: Secondary | ICD-10-CM

## 2022-05-16 DIAGNOSIS — F5081 Binge eating disorder: Secondary | ICD-10-CM

## 2022-05-16 DIAGNOSIS — E669 Obesity, unspecified: Secondary | ICD-10-CM

## 2022-05-16 DIAGNOSIS — Z7985 Long-term (current) use of injectable non-insulin antidiabetic drugs: Secondary | ICD-10-CM

## 2022-05-16 DIAGNOSIS — K219 Gastro-esophageal reflux disease without esophagitis: Secondary | ICD-10-CM | POA: Diagnosis not present

## 2022-05-16 DIAGNOSIS — Z7984 Long term (current) use of oral hypoglycemic drugs: Secondary | ICD-10-CM

## 2022-05-16 MED ORDER — SEMAGLUTIDE (2 MG/DOSE) 8 MG/3ML ~~LOC~~ SOPN: 2.0000 mg | PEN_INJECTOR | SUBCUTANEOUS | 0 refills | Status: DC

## 2022-05-16 MED ORDER — VITAMIN D (ERGOCALCIFEROL) 1.25 MG (50000 UNIT) PO CAPS: 50000.0000 [IU] | ORAL_CAPSULE | ORAL | 0 refills | Status: DC

## 2022-05-16 NOTE — Progress Notes (Signed)
Chief Complaint:   OBESITY Karen [name] is here to discuss her progress with her obesity treatment plan along with follow-up of her obesity related diagnoses. Karen [name] is on the Category 3 Plan and 1500 calories and states she is following her eating plan approximately 75% of the time. Karen [name] states she is using resistance bands for 10-15 minutes 2-3 times per week.  Today's visit was #: 6 Starting weight: 339 lbs Starting date: 02/12/2022 Today's weight: 326 lbs Today's date: 05/16/2022 Total lbs lost to date: 13 Total lbs lost since last in-office visit: 8  Interim history: Karen [name] presents today for follow-up.  Since last office visit she has lost 8 pounds.  She reports good adherence to prescribed reduced calorie nutrition plan.  She notes adequate satiety and satiation.  She is also practicing mindfulness around treats.  She does not feel deprived or restricted.  Last office visit we reduced her calories to 1500.  She is also on Ozempic for diabetes and weight management.  She is currently at the 2 mg dose without any adverse effects.  Her acid reflux has improved.  She also notes not using app to track finds it too cumbersome but has been keeping a paper journal.  Subjective:   1. Type 2 diabetes mellitus with other specified complication, without long-term current use of insulin (Karen [name]) She reports having morning elevations between 160 and 180 goal is between 80 and 130.  She is on Ozempic and pioglitazone and metformin without any side effects.  2. Gastroesophageal reflux disease, unspecified whether esophagitis present Improved, on Prevacid twice a day.  No alarming symptoms.  3. Vitamin D deficiency She is currently taking prescription vitamin D 50,000 IU each week. She denies nausea, vomiting or muscle weakness.  4. Binge eating disorder Improved.  History of cognitive behavioral therapy which was effective.  Patient practicing mindfulness and displays good coping mechanisms and control  strategies.  Assessment/Plan:   1. Type 2 diabetes mellitus with other specified complication, without long-term current use of insulin (HCC) We will increase Ozempic to 2 mg a day.  She will continue on pioglitazone with a goal of discontinuing medication once she has been on Ozempic for at least 4 weeks.  She will continue metformin.  Rechecking hemoglobin A1c today.  - Hemoglobin A1c - Semaglutide, 2 MG/DOSE, 8 MG/3ML SOPN; Inject 2 mg as directed once a week.  Dispense: 3 mL; Refill: 0  2. Gastroesophageal reflux disease, unspecified whether esophagitis present Weight loss therapy, consider decreasing PPI to once a day.  3. Vitamin D deficiency We will refill prescription Vitamin D for 90 days.   - Vitamin D, Ergocalciferol, (DRISDOL) 1.25 MG (50000 UNIT) CAPS capsule; Take 1 capsule (50,000 Units total) by mouth every 7 (seven) days.  Dispense: 13 capsule; Refill: 0  4. Binge eating disorder Continue with medical nutrition therapy, GLP-1 drug and behavioral modification.  5. Obesity, current BMI 52.7 We will reduce her calories to 1500 cal or category 3 meal plan this will create about a 600-calorie deficit from her predicted BMR.  Patient was also instructed to begin journaling to ensure she is reaching target caloric zone and also for educational purposes.  We are also increasing Ozempic to 2 mg for her diabetes in hopes that we may be able to discontinue pioglitazone with improved glycemic control as this will assist with weight loss efforts   Karen [name] is currently in the action stage of change. As such, her goal is to continue with weight  loss efforts. She has agreed to the Category 3 Plan.   Exercise goals: All adults should avoid inactivity. Some physical activity is better than none, and adults who participate in any amount of physical activity gain some health benefits.  Behavioral modification strategies: increasing lean protein intake, decreasing simple carbohydrates,  increasing water intake, meal planning and cooking strategies, keeping healthy foods in the home, emotional eating strategies, avoiding temptations, and planning for success.  Karen [name] has agreed to follow-up with our clinic in 3 weeks. She was informed of the importance of frequent follow-up visits to maximize her success with intensive lifestyle modifications for her multiple health conditions.   Objective:   Blood pressure 120/82, pulse 94, temperature 97.7 F (36.5 C), height '5\' 6"'$  (1.676 m), weight (!) 326 lb (147.9 kg), last menstrual period 07/16/2016, SpO2 94 %. Body mass index is 52.62 kg/m.  General: Cooperative, alert, well developed, in no acute distress. HEENT: Conjunctivae and lids unremarkable. Cardiovascular: Regular rhythm.  Lungs: Normal work of breathing. Neurologic: No focal deficits.   Lab Results  Component Value Date   CREATININE 0.72 02/12/2022   BUN 18 02/12/2022   NA 140 02/12/2022   K 4.3 02/12/2022   CL 99 02/12/2022   CO2 25 02/12/2022   Lab Results  Component Value Date   ALT 14 02/12/2022   AST 17 02/12/2022   ALKPHOS 83 02/12/2022   BILITOT 0.3 02/12/2022   Lab Results  Component Value Date   HGBA1C 6.0 (H) 05/16/2022   HGBA1C 6.8 (H) 02/12/2022   HGBA1C 6.3 (H) 04/13/2019   Lab Results  Component Value Date   INSULIN 12.5 02/12/2022   INSULIN 15.1 12/16/2018   Lab Results  Component Value Date   TSH 0.988 02/12/2022   Lab Results  Component Value Date   CHOL 149 02/12/2022   HDL 52 02/12/2022   LDLCALC 76 02/12/2022   TRIG 119 02/12/2022   CHOLHDL 2.9 02/12/2022   Lab Results  Component Value Date   VD25OH 16.3 (L) 02/12/2022   VD25OH 31.9 04/13/2019   VD25OH 23.3 (L) 12/16/2018   Lab Results  Component Value Date   WBC 5.9 02/12/2022   HGB 13.0 02/12/2022   HCT 40.8 02/12/2022   MCV 81 02/12/2022   PLT 220 02/12/2022   No results found for: "IRON", "TIBC", "FERRITIN"  Attestation Statements:   Reviewed by  clinician on day of visit: allergies, medications, problem list, medical history, surgical history, family history, social history, and previous encounter notes.   Wilhemena Durie, am acting as transcriptionist for Thomes Dinning, MD.  I have reviewed the above documentation for accuracy and completeness, and I agree with the above. -Thomes Dinning, MD

## 2022-05-17 LAB — HEMOGLOBIN A1C
Est. average glucose Bld gHb Est-mCnc: 126 mg/dL
Hgb A1c MFr Bld: 6 % — ABNORMAL HIGH (ref 4.8–5.6)

## 2022-05-23 DIAGNOSIS — R202 Paresthesia of skin: Secondary | ICD-10-CM | POA: Diagnosis not present

## 2022-05-23 DIAGNOSIS — M542 Cervicalgia: Secondary | ICD-10-CM | POA: Diagnosis not present

## 2022-05-23 DIAGNOSIS — M6289 Other specified disorders of muscle: Secondary | ICD-10-CM | POA: Diagnosis not present

## 2022-05-31 DIAGNOSIS — K582 Mixed irritable bowel syndrome: Secondary | ICD-10-CM | POA: Diagnosis not present

## 2022-05-31 DIAGNOSIS — K760 Fatty (change of) liver, not elsewhere classified: Secondary | ICD-10-CM | POA: Diagnosis not present

## 2022-05-31 DIAGNOSIS — R1011 Right upper quadrant pain: Secondary | ICD-10-CM | POA: Diagnosis not present

## 2022-05-31 DIAGNOSIS — K5900 Constipation, unspecified: Secondary | ICD-10-CM | POA: Diagnosis not present

## 2022-06-06 ENCOUNTER — Ambulatory Visit (INDEPENDENT_AMBULATORY_CARE_PROVIDER_SITE_OTHER): Payer: BC Managed Care – PPO | Admitting: Internal Medicine

## 2022-06-06 ENCOUNTER — Encounter (INDEPENDENT_AMBULATORY_CARE_PROVIDER_SITE_OTHER): Payer: Self-pay | Admitting: Internal Medicine

## 2022-06-06 VITALS — BP 123/82 | HR 88 | Temp 97.8°F | Ht 66.0 in | Wt 318.0 lb

## 2022-06-06 DIAGNOSIS — E669 Obesity, unspecified: Secondary | ICD-10-CM | POA: Diagnosis not present

## 2022-06-06 DIAGNOSIS — E1169 Type 2 diabetes mellitus with other specified complication: Secondary | ICD-10-CM | POA: Diagnosis not present

## 2022-06-06 DIAGNOSIS — E559 Vitamin D deficiency, unspecified: Secondary | ICD-10-CM

## 2022-06-06 DIAGNOSIS — F5081 Binge eating disorder: Secondary | ICD-10-CM

## 2022-06-06 DIAGNOSIS — Z6841 Body Mass Index (BMI) 40.0 and over, adult: Secondary | ICD-10-CM

## 2022-06-06 DIAGNOSIS — Z7985 Long-term (current) use of injectable non-insulin antidiabetic drugs: Secondary | ICD-10-CM

## 2022-06-06 MED ORDER — SEMAGLUTIDE (2 MG/DOSE) 8 MG/3ML ~~LOC~~ SOPN: 2.0000 mg | PEN_INJECTOR | SUBCUTANEOUS | 0 refills | Status: DC

## 2022-06-06 NOTE — Progress Notes (Signed)
Office: [phone]  /  Fax: [phone]  WEIGHT SUMMARY AND BIOMETRICS  Medical Weight Loss Height: '5\' 6"'$  (1.676 m) Weight: 318 lb (144.2 kg) Temp: 97.8 F (36.6 C) Pulse Rate: 88 BP: 123/82 SpO2: 95 % Fasting: yes Labs: no Today's Visit #: 7 Weight at Last VIsit: 326 lb Weight Lost Since Last Visit: 8 lb  Body Fat %: 53.4 % Fat Mass (lbs): 170.2 lbs Muscle Mass (lbs): 141.2 lbs Total Body Water (lbs): 974.4 lbs Visceral Fat Rating : 20 Peak Weight: 339 lb Starting Date: 02/12/22 Starting Weight: 339 lb Total Weight Loss (lbs): 21 lb (9.526 kg)    HPI  Chief Complaint: OBESITY  Karen [name] is here to discuss her progress with her obesity treatment plan. She is on the the Category 3 Plan and states she is following her eating plan approximately 80 % of the time. She states she is doing resistant band exercising 20 minutes 2-3 times per week.   Interval History: Since last office she reports she has lost 8 pounds.  She reports adequate satiety, satiation, decrease hunger signals and cravings.  She denies consumption of liquid calories and reports healthy snacking.  Her adherence to prescribed reduced calorie plan is good.  She is also thinking about ways to increase physical activity and may be going to fitness center to use the pool.  She has problems with arthritis of her knees which limit her ability to do physical activity upright.  She is on Ozempic for diabetes and has an aide to assist with weight loss.  She is currently at 2 mg weekly and is tolerating medication without any adverse effects.  Her BIA shows a decrease in body fat percentage from 56-53.  Her visceral fat rating has gone down from 23-20.  Pharmacotherapy: GLP-1 without side effects  PHYSICAL EXAM:  Blood pressure 123/82, pulse 88, temperature 97.8 F (36.6 C), height '5\' 6"'$  (1.676 m), weight (!) 318 lb (144.2 kg), last menstrual period 07/16/2016, SpO2 95 %. Body mass index is 51.33 kg/m.  General:  She is overweight, cooperative, alert, well developed, and in no acute distress. PSYCH: Has normal mood, affect and thought process.   HEENT: EOMI, sclerae are anicteric. Lungs: Normal breathing effort, no conversational dyspnea. Extremities: No edema.  Neurologic: No gross sensory or motor deficits. No tremors or fasciculations noted.    DIAGNOSTIC DATA REVIEWED:  BMET    Component Value Date/Time   NA 140 02/12/2022 1047   K 4.3 02/12/2022 1047   CL 99 02/12/2022 1047   CO2 25 02/12/2022 1047   GLUCOSE 115 (H) 02/12/2022 1047   GLUCOSE 131 (H) 12/02/2017 1619   BUN 18 02/12/2022 1047   CREATININE 0.72 02/12/2022 1047   CALCIUM 9.8 02/12/2022 1047   GFRNONAA 103 04/13/2019 0000   GFRAA 118 04/13/2019 0000   Lab Results  Component Value Date   HGBA1C 6.0 (H) 05/16/2022   HGBA1C 6.3 (H) 04/13/2019   Lab Results  Component Value Date   INSULIN 12.5 02/12/2022   INSULIN 15.1 12/16/2018   CBC    Component Value Date/Time   WBC 5.9 02/12/2022 1047   WBC 7.9 12/02/2017 1619   RBC 5.06 02/12/2022 1047   RBC 5.57 (H) 12/02/2017 1619   HGB 13.0 02/12/2022 1047   HCT 40.8 02/12/2022 1047   PLT 220 02/12/2022 1047   MCV 81 02/12/2022 1047   MCH 25.7 (L) 02/12/2022 1047   MCH 25.0 (L) 12/02/2017 1619   MCHC 31.9 02/12/2022 1047  MCHC 31.5 12/02/2017 1619   RDW 14.1 02/12/2022 1047   Iron/TIBC/Ferritin/ %Sat No results found for: "IRON", "TIBC", "FERRITIN", "IRONPCTSAT" Lipid Panel     Component Value Date/Time   CHOL 149 02/12/2022 1047   TRIG 119 02/12/2022 1047   HDL 52 02/12/2022 1047   CHOLHDL 2.9 02/12/2022 1047   LDLCALC 76 02/12/2022 1047   Hepatic Function Panel     Component Value Date/Time   PROT 6.9 02/12/2022 1047   ALBUMIN 4.4 02/12/2022 1047   AST 17 02/12/2022 1047   ALT 14 02/12/2022 1047   ALKPHOS 83 02/12/2022 1047   BILITOT 0.3 02/12/2022 1047      Component Value Date/Time   TSH 0.988 02/12/2022 1047     ASSESSMENT AND  PLAN  TREATMENT PLAN FOR OBESITY:  Recommended Dietary Goals  Karen [name] is currently in the action stage of change. As such, her goal is to continue with weight loss efforts. She has agreed to the Category 3 Plan.  Behavioral Intervention  We discussed the following Behavioral Modification Strategies today: increasing lean protein intake, no skipping meals, avoiding temptations, and planning for success.  Additional resources provided today: NA  Recommended Physical Activity Goals  Karen [name] has been instructed to work up to 150 minutes of moderate intensity aerobic activity a week and strengthening exercises 2-3 times per week for cardiovascular health, weight loss maintenance and preservation of muscle mass.   She has agreed to increase physical activity in their day and reduce sedentary time (increase NEAT).  and continue physical activity as is.    Pharmacotherapy We discussed various medication options to help Karen [name] with her weight loss efforts and we both agreed to [place].  ASSOCIATED CONDITIONS ADDRESSED TODAY  Type 2 diabetes mellitus with other specified complication, without long-term current use of insulin (HCC) Assessment & Plan: Her most recent A1c is improved at 6.0.  She has not been monitoring her blood glucose closely but denies hypoglycemia.  I would like for her to come off pioglitazone.  I recommend she check her blood sugars once a day alternating between premeals and postprandial.  She is on Ozempic and metformin without adverse effects.  She will continue current regimen.  She is scheduling a patient appointment with a new PCP.  Orders: -     Semaglutide (2 MG/DOSE); Inject 2 mg as directed once a week.  Dispense: 3 mL; Refill: 0  Vitamin D deficiency  Binge eating disorder Assessment & Plan: Improved.  History of cognitive behavioral therapy in the past.  No abnormal cravings or eating patterns reported continue to monitor.   Obesity, current BMI 52.7       Return in about 2 weeks (around 06/20/2022) for For Weight Mangement with Dr. Gerarda Fraction.Marland Kitchen She was informed of the importance of frequent follow up visits to maximize her success with intensive lifestyle modifications for her multiple health conditions.   ATTESTASTION STATEMENTS:  Reviewed by clinician on day of visit: allergies, medications, problem list, medical history, surgical history, family history, social history, and previous encounter notes.      Thomes Dinning, MD

## 2022-06-06 NOTE — Assessment & Plan Note (Signed)
Improved.  History of cognitive behavioral therapy in the past.  No abnormal cravings or eating patterns reported continue to monitor.

## 2022-06-06 NOTE — Assessment & Plan Note (Signed)
Her most recent A1c is improved at 6.0.  She has not been monitoring her blood glucose closely but denies hypoglycemia.  I would like for her to come off pioglitazone.  I recommend she check her blood sugars once a day alternating between premeals and postprandial.  She is on Ozempic and metformin without adverse effects.  She will continue current regimen.  She is scheduling a patient appointment with a new PCP.

## 2022-06-07 DIAGNOSIS — K760 Fatty (change of) liver, not elsewhere classified: Secondary | ICD-10-CM | POA: Diagnosis not present

## 2022-06-07 DIAGNOSIS — R109 Unspecified abdominal pain: Secondary | ICD-10-CM | POA: Diagnosis not present

## 2022-06-11 DIAGNOSIS — M4312 Spondylolisthesis, cervical region: Secondary | ICD-10-CM | POA: Diagnosis not present

## 2022-06-11 DIAGNOSIS — M5412 Radiculopathy, cervical region: Secondary | ICD-10-CM | POA: Diagnosis not present

## 2022-06-11 DIAGNOSIS — M542 Cervicalgia: Secondary | ICD-10-CM | POA: Diagnosis not present

## 2022-06-11 DIAGNOSIS — M4722 Other spondylosis with radiculopathy, cervical region: Secondary | ICD-10-CM | POA: Diagnosis not present

## 2022-06-14 DIAGNOSIS — M47812 Spondylosis without myelopathy or radiculopathy, cervical region: Secondary | ICD-10-CM | POA: Diagnosis not present

## 2022-06-14 DIAGNOSIS — M50321 Other cervical disc degeneration at C4-C5 level: Secondary | ICD-10-CM | POA: Diagnosis not present

## 2022-06-14 DIAGNOSIS — M50323 Other cervical disc degeneration at C6-C7 level: Secondary | ICD-10-CM | POA: Diagnosis not present

## 2022-06-14 DIAGNOSIS — M50322 Other cervical disc degeneration at C5-C6 level: Secondary | ICD-10-CM | POA: Diagnosis not present

## 2022-06-24 ENCOUNTER — Ambulatory Visit (INDEPENDENT_AMBULATORY_CARE_PROVIDER_SITE_OTHER): Payer: BC Managed Care – PPO | Admitting: Internal Medicine

## 2022-07-01 ENCOUNTER — Ambulatory Visit (INDEPENDENT_AMBULATORY_CARE_PROVIDER_SITE_OTHER): Payer: BC Managed Care – PPO | Admitting: Internal Medicine

## 2022-07-01 ENCOUNTER — Encounter (INDEPENDENT_AMBULATORY_CARE_PROVIDER_SITE_OTHER): Payer: Self-pay | Admitting: Internal Medicine

## 2022-07-01 VITALS — BP 135/85 | HR 80 | Temp 98.3°F | Ht 66.0 in | Wt 320.0 lb

## 2022-07-01 DIAGNOSIS — R7989 Other specified abnormal findings of blood chemistry: Secondary | ICD-10-CM | POA: Diagnosis not present

## 2022-07-01 DIAGNOSIS — Z7984 Long term (current) use of oral hypoglycemic drugs: Secondary | ICD-10-CM

## 2022-07-01 DIAGNOSIS — E1169 Type 2 diabetes mellitus with other specified complication: Secondary | ICD-10-CM

## 2022-07-01 DIAGNOSIS — Z7985 Long-term (current) use of injectable non-insulin antidiabetic drugs: Secondary | ICD-10-CM

## 2022-07-01 DIAGNOSIS — Z6841 Body Mass Index (BMI) 40.0 and over, adult: Secondary | ICD-10-CM

## 2022-07-01 DIAGNOSIS — E559 Vitamin D deficiency, unspecified: Secondary | ICD-10-CM

## 2022-07-01 DIAGNOSIS — G4733 Obstructive sleep apnea (adult) (pediatric): Secondary | ICD-10-CM | POA: Diagnosis not present

## 2022-07-01 DIAGNOSIS — E669 Obesity, unspecified: Secondary | ICD-10-CM | POA: Diagnosis not present

## 2022-07-01 MED ORDER — DEXAMETHASONE 1 MG PO TABS: 1.0000 mg | ORAL_TABLET | Freq: Once | ORAL | 0 refills | Status: AC

## 2022-07-01 MED ORDER — SEMAGLUTIDE (2 MG/DOSE) 8 MG/3ML ~~LOC~~ SOPN: 2.0000 mg | PEN_INJECTOR | SUBCUTANEOUS | 0 refills | Status: DC

## 2022-07-01 MED ORDER — DEXAMETHASONE 1 MG PO TABS: 1.0000 mg | ORAL_TABLET | Freq: Once | ORAL | 0 refills | Status: DC

## 2022-07-01 NOTE — Assessment & Plan Note (Signed)
On CPAP with reported good compliance. Continue PAP therapy. Weight loss of 15% or more may improve AHI.

## 2022-07-01 NOTE — Assessment & Plan Note (Signed)
Most recent vitamin D levels  Lab Results  Component Value Date   VD25OH 16.3 (L) 02/12/2022   VD25OH 31.9 04/13/2019   VD25OH 23.3 (L) 12/16/2018     Deficiency state associated with adiposity and may result in leptin resistance, weight gain and fatigue. Currently on vitamin D supplementation without any adverse effects.  Plan: Check vitamin D levels today consider transitioning to over-the-counter supplementation

## 2022-07-01 NOTE — Assessment & Plan Note (Signed)
Diabetes has improved with an A1c of 6.0.  She still having some excursions in the morning in the 160s and 170s.  She is currently on Ozempic and metformin and pioglitazone as prescribed by former PCP.  I would like for her to come off pioglitazone but she is hesitant because of elevations in the morning despite A1c being at goal.  Counseled on goals of care, monitoring for complications and importance of staying updated on immunizations and diabetes preventive measures. Continue with reduced calorie meal plan low on processed crabs and simple sugars. Ongoing weight loss will improve insulin resistance and glycemic control  Lab Results  Component Value Date   HGBA1C 6.0 (H) 05/16/2022   HGBA1C 6.8 (H) 02/12/2022   HGBA1C 6.3 (H) 04/13/2019   Lab Results  Component Value Date   LDLCALC 76 02/12/2022   CREATININE 0.72 02/12/2022   We are checking a UMA with neck set of labs.  Her blood pressure slightly above target and she is not on blood pressure medications.  If she has presence of protein she may benefit from ACE inhibitor.  Patient will continue with weight loss therapy

## 2022-07-01 NOTE — Progress Notes (Addendum)
Office: [phone]  /  Fax: [phone]  [facility]  No data recorded   HPI  Chief Complaint: OBESITY  Karen [name] is here to discuss her progress with her obesity treatment plan. She is on the the Category 3 Plan and states she is following her eating plan approximately 80 % of the time. She states she is exercising 15 minutes 3-4 times per week.   Interval History:  Since last office visit she has gained 2 pounds and reports good. adherence to prescribed reduced calorie nutrition plan.  She does acknowledge having some guest over and deviated from plan and feels like she enjoyed herself a little too much.  Has been working on getting back on course. Denies problems with appetite and hunger signals.  Denies problems with satiety and satiation.  Denies problems with eating patterns and portion control.  Sleeping approximately 8 hours a day.  Sleep described as restorative.  On CPAP Barriers identified none.    Pharmacotherapy: Ozempic 2 mg a week with primary indication of type 2 diabetes  PHYSICAL EXAM:  Blood pressure 135/85, pulse 80, temperature 98.3 F (36.8 C), height '5\' 6"'$  (1.676 m), weight (!) 320 lb (145.2 kg), last menstrual period 07/16/2016, SpO2 95 %. Body mass index is 51.65 kg/m.  General: She is overweight, cooperative, alert, well developed, and in no acute distress. PSYCH: Has normal mood, affect and thought process.   HEENT: EOMI, sclerae are anicteric. Lungs: Normal breathing effort, no conversational dyspnea. Extremities: No edema.  Neurologic: No gross sensory or motor deficits. No tremors or fasciculations noted.    ASSESSMENT AND PLAN  TREATMENT PLAN FOR OBESITY:  Recommended Dietary Goals  Karen [name] is currently in the action stage of change. As such, her goal is to continue weight management plan. She has agreed to the Category 3 Plan.  Behavioral Intervention  We discussed the following Behavioral Modification Strategies  today: increasing lean protein intake, decreasing simple carbohydrates , think about ways to increase physical activity, reading food labels and making healthy choices when eating convenient foods, and work on tracking and journaling calories using tracking App.  Additional resources provided today: NA  Recommended Physical Activity Goals  Karen [name] has been advised to work up to 150 minutes of moderate intensity aerobic activity a week and strengthening exercises 2-3 times per week for cardiovascular health, weight loss maintenance and preservation of muscle mass.   She has agreed to increase physical activity in their day and reduce sedentary time (increase NEAT).    Pharmacotherapy We discussed various medication options to help Karen [name] with her weight loss efforts and we both agreed to continue Ozempic with the primary indication of type 2 diabetes..  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  We will perform 1 mg dexamethasone suppression test.  Patient provided with instructions.  We will check a morning cortisol following 1 mg of dexamethasone at 11 PM the night before test.  Orders: -     Cortisol  Type 2 diabetes mellitus with other specified complication, without long-term current use of insulin (HCC) Assessment & Plan: Diabetes has improved with an A1c of 6.0.  She still having some excursions in the morning in the 160s and 170s.  She is currently on Ozempic and metformin and pioglitazone as prescribed by former PCP.  I would like for her to come off pioglitazone but she is hesitant  because of elevations in the morning despite A1c being at goal.  Counseled on goals of care, monitoring for complications and importance of staying updated on immunizations and diabetes preventive measures. Continue with reduced calorie meal plan low on  processed crabs and simple sugars. Ongoing weight loss will improve insulin resistance and glycemic control  Lab Results  Component Value Date   HGBA1C 6.0 (H) 05/16/2022   HGBA1C 6.8 (H) 02/12/2022   HGBA1C 6.3 (H) 04/13/2019   Lab Results  Component Value Date   LDLCALC 76 02/12/2022   CREATININE 0.72 02/12/2022   We are checking a UMA with neck set of labs.  Her blood pressure slightly above target and she is not on blood pressure medications.  If she has presence of protein she may benefit from ACE inhibitor.  Patient will continue with weight loss therapy   Orders: -     Semaglutide (2 MG/DOSE); Inject 2 mg as directed once a week.  Dispense: 3 mL; Refill: 0 -     dexAMETHasone; Take 1 tablet (1 mg total) by mouth once for 1 dose. Take at 11:00 PM the night before testing  Dispense: 1 tablet; Refill: 0 -     Cortisol -     Microalbumin / creatinine urine ratio  OSA on CPAP Assessment & Plan: On CPAP with reported good compliance. Continue PAP therapy. Weight loss of 15% or more may improve AHI.      Vitamin D deficiency Assessment & Plan: Most recent vitamin D levels  Lab Results  Component Value Date   VD25OH 16.3 (L) 02/12/2022   VD25OH 31.9 04/13/2019   VD25OH 23.3 (L) 12/16/2018     Deficiency state associated with adiposity and may result in leptin resistance, weight gain and fatigue. Currently on vitamin D supplementation without any adverse effects.  Plan: Check vitamin D levels today consider transitioning to over-the-counter supplementation   Orders: -     VITAMIN D 25 Hydroxy (Vit-D Deficiency, Fractures)  Abnormal cortisol level Assessment & Plan: I received test results which show that her cortisol levels were above 1.8 mcg following 1 mg of dexamethasone the night before.  This is a positive for nonsuppression.  We will order a 24-hour urine collection for free cortisol.  Patient notified via portal as we were unable to reach her over the  phone.  Orders: -     Cortisol, urine, free      DIAGNOSTIC DATA REVIEWED:  BMET    Component Value Date/Time   NA 140 02/12/2022 1047   K 4.3 02/12/2022 1047   CL 99 02/12/2022 1047   CO2 25 02/12/2022 1047   GLUCOSE 115 (H) 02/12/2022 1047   GLUCOSE 131 (H) 12/02/2017 1619   BUN 18 02/12/2022 1047   CREATININE 0.72 02/12/2022 1047   CALCIUM 9.8 02/12/2022 1047   GFRNONAA 103 04/13/2019 0000   GFRAA 118 04/13/2019 0000   Lab Results  Component Value Date   HGBA1C 6.0 (H) 05/16/2022   HGBA1C 6.3 (H) 04/13/2019   Lab Results  Component Value Date   INSULIN 12.5 02/12/2022   INSULIN 15.1 12/16/2018   Lab Results  Component Value Date   TSH 0.988 02/12/2022   CBC    Component Value Date/Time   WBC 5.9 02/12/2022 1047   WBC 7.9 12/02/2017 1619   RBC 5.06 02/12/2022 1047   RBC 5.57 (H) 12/02/2017 1619   HGB 13.0 02/12/2022 1047   HCT 40.8 02/12/2022 1047   PLT 220 02/12/2022 1047  MCV 81 02/12/2022 1047   MCH 25.7 (L) 02/12/2022 1047   MCH 25.0 (L) 12/02/2017 1619   MCHC 31.9 02/12/2022 1047   MCHC 31.5 12/02/2017 1619   RDW 14.1 02/12/2022 1047   Iron Studies No results found for: "IRON", "TIBC", "FERRITIN", "IRONPCTSAT" Lipid Panel     Component Value Date/Time   CHOL 149 02/12/2022 1047   TRIG 119 02/12/2022 1047   HDL 52 02/12/2022 1047   CHOLHDL 2.9 02/12/2022 1047   LDLCALC 76 02/12/2022 1047   Hepatic Function Panel     Component Value Date/Time   PROT 6.9 02/12/2022 1047   ALBUMIN 4.4 02/12/2022 1047   AST 17 02/12/2022 1047   ALT 14 02/12/2022 1047   ALKPHOS 83 02/12/2022 1047   BILITOT 0.3 02/12/2022 1047      Component Value Date/Time   TSH 0.988 02/12/2022 1047   Nutritional Lab Results  Component Value Date   VD25OH 28.4 (L) 07/08/2022   VD25OH 16.3 (L) 02/12/2022   VD25OH 31.9 04/13/2019      Return in about 2 weeks (around 07/15/2022) for For Weight Mangement with Dr. Gerarda Fraction.Marland Kitchen She was informed of the importance of  frequent follow up visits to maximize her success with intensive lifestyle modifications for her multiple health conditions.    ATTESTASTION STATEMENTS:  Reviewed by clinician on day of visit: allergies, medications, problem list, medical history, surgical history, family history, social history, and previous encounter notes.   Time spent on visit including pre-visit chart review and post-visit care and charting was 30 minutes and additional 15 minutes were spent interpreting abnormal dexamethasone suppression test and coordinating care.   Thomes Dinning, MD

## 2022-07-01 NOTE — Assessment & Plan Note (Signed)
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  We will perform 1 mg dexamethasone suppression test.  Patient provided with instructions.  We will check a morning cortisol following 1 mg of dexamethasone at 11 PM the night before test.

## 2022-07-08 DIAGNOSIS — E559 Vitamin D deficiency, unspecified: Secondary | ICD-10-CM | POA: Diagnosis not present

## 2022-07-08 DIAGNOSIS — Z6841 Body Mass Index (BMI) 40.0 and over, adult: Secondary | ICD-10-CM | POA: Diagnosis not present

## 2022-07-08 DIAGNOSIS — E1169 Type 2 diabetes mellitus with other specified complication: Secondary | ICD-10-CM | POA: Diagnosis not present

## 2022-07-09 LAB — CORTISOL: Cortisol: 6.8 ug/dL (ref 6.2–19.4)

## 2022-07-09 LAB — VITAMIN D 25 HYDROXY (VIT D DEFICIENCY, FRACTURES): Vit D, 25-Hydroxy: 28.4 ng/mL — ABNORMAL LOW (ref 30.0–100.0)

## 2022-07-11 DIAGNOSIS — R7989 Other specified abnormal findings of blood chemistry: Secondary | ICD-10-CM | POA: Insufficient documentation

## 2022-07-11 NOTE — Assessment & Plan Note (Signed)
I received test results which show that her cortisol levels were above 1.8 mcg following 1 mg of dexamethasone the night before.  This is a positive for nonsuppression.  We will order a 24-hour urine collection for free cortisol.  Patient notified via portal as we were unable to reach her over the phone.

## 2022-07-11 NOTE — Addendum Note (Signed)
Addended by: Glendale Chard on: 07/11/2022 09:40 AM   Modules accepted: Orders, Level of Service

## 2022-07-15 DIAGNOSIS — R7989 Other specified abnormal findings of blood chemistry: Secondary | ICD-10-CM | POA: Diagnosis not present

## 2022-07-15 NOTE — Progress Notes (Signed)
Office: [phone]  /  Fax: [phone]  WEIGHT SUMMARY AND BIOMETRICS  Medical Weight Loss Weight: 320 lb (145.2 kg) Temp: 98.3 F (36.8 C) Pulse Rate: 80 BP: 135/85 SpO2: 95 % Fasting: No Labs: No Today's Visit #: 8 Weight at Last Visit: 318 lb Weight Lost Since Last Visit: +2  Body Fat %: 54.2 % Fat Mass (lbs): 173.8 lbs Muscle Mass (lbs): 1 lbs Total Body Water (lbs): 139.6 lbs Visceral Fat Rating : 21 Peak Weight: 339 lb Starting Date: 02/12/22 Starting Weight: 339 lb Total Weight Loss (lbs): 19 lb (8.618 kg) Comments: data pulled from 07/01/22 visit    HPI  Chief Complaint: OBESITY  Trishna is here to discuss her progress with her obesity treatment plan. She is on the the Category 3 Plan and states she is following her eating plan approximately 80 % of the time. She states she is exercising 15 minutes 3-4 times per week.   Interval History:  Since last office visit she has gained 2 pounds and reports good. adherence to prescribed reduced calorie nutrition plan.  She does acknowledge having some guest over and deviated from plan and feels like she enjoyed herself a little too much.  Has been working on getting back on course. Denies problems with appetite and hunger signals.  Denies problems with satiety and satiation.  Denies problems with eating patterns and portion control.  Sleeping approximately 8 hours a day.  Sleep described as restorative.  On CPAP Barriers identified none.    Pharmacotherapy: Ozempic 2 mg a week with primary indication of type 2 diabetes  PHYSICAL EXAM:  Blood pressure 135/85, pulse 80, temperature 98.3 F (36.8 C), height '5\' 6"'$  (1.676 m), weight (!) 320 lb (145.2 kg), last menstrual period 07/16/2016, SpO2 95 %. Body mass index is 51.65 kg/m.  General: She is overweight, cooperative, alert, well developed, and in no acute distress. PSYCH: Has normal mood, affect and thought process.   HEENT: EOMI, sclerae are  anicteric. Lungs: Normal breathing effort, no conversational dyspnea. Extremities: No edema.  Neurologic: No gross sensory or motor deficits. No tremors or fasciculations noted.    ASSESSMENT AND PLAN  TREATMENT PLAN FOR OBESITY:  Recommended Dietary Goals  Yomna is currently in the action stage of change. As such, her goal is to continue weight management plan. She has agreed to the Category 3 Plan.  Behavioral Intervention  We discussed the following Behavioral Modification Strategies today: increasing lean protein intake, decreasing simple carbohydrates , think about ways to increase physical activity, reading food labels and making healthy choices when eating convenient foods, and work on tracking and journaling calories using tracking App.  Additional resources provided today: NA  Recommended Physical Activity Goals  Hatsuko has been advised to work up to 150 minutes of moderate intensity aerobic activity a week and strengthening exercises 2-3 times per week for cardiovascular health, weight loss maintenance and preservation of muscle mass.   She has agreed to increase physical activity in their day and reduce sedentary time (increase NEAT).    Pharmacotherapy We discussed various medication options to help [place] with her weight loss efforts and we both agreed to continue Ozempic with the primary indication of type 2 diabetes..  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  We will perform 1 mg dexamethasone suppression test.  Patient provided with instructions.  We will check a morning  cortisol following 1 mg of dexamethasone at 11 PM the night before test.  Orders: -     Cortisol  Type 2 diabetes mellitus with other specified complication, without long-term current use of insulin (HCC) Assessment & Plan: Diabetes has improved  with an A1c of 6.0.  She still having some excursions in the morning in the 160s and 170s.  She is currently on Ozempic and metformin and pioglitazone as prescribed by former PCP.  I would like for her to come off pioglitazone but she is hesitant because of elevations in the morning despite A1c being at goal.  Counseled on goals of care, monitoring for complications and importance of staying updated on immunizations and diabetes preventive measures. Continue with reduced calorie meal plan low on processed crabs and simple sugars. Ongoing weight loss will improve insulin resistance and glycemic control  Lab Results  Component Value Date   HGBA1C 6.0 (H) 05/16/2022   HGBA1C 6.8 (H) 02/12/2022   HGBA1C 6.3 (H) 04/13/2019   Lab Results  Component Value Date   LDLCALC 76 02/12/2022   CREATININE 0.72 02/12/2022   We are checking a UMA with neck set of labs.  Her blood pressure slightly above target and she is not on blood pressure medications.  If she has presence of protein she may benefit from ACE inhibitor.  Patient will continue with weight loss therapy   Orders: -     Semaglutide (2 MG/DOSE); Inject 2 mg as directed once a week.  Dispense: 3 mL; Refill: 0 -     dexAMETHasone; Take 1 tablet (1 mg total) by mouth once for 1 dose. Take at 11:00 PM the night before testing  Dispense: 1 tablet; Refill: 0 -     Cortisol -     Microalbumin / creatinine urine ratio  OSA on CPAP Assessment & Plan: On CPAP with reported good compliance. Continue PAP therapy. Weight loss of 15% or more may improve AHI.      Vitamin D deficiency Assessment & Plan: Most recent vitamin D levels  Lab Results  Component Value Date   VD25OH 16.3 (L) 02/12/2022   VD25OH 31.9 04/13/2019   VD25OH 23.3 (L) 12/16/2018     Deficiency state associated with adiposity and may result in leptin resistance, weight gain and fatigue. Currently on vitamin D supplementation without any adverse effects.  Plan: Check vitamin D  levels today consider transitioning to over-the-counter supplementation   Orders: -     VITAMIN D 25 Hydroxy (Vit-D Deficiency, Fractures)  Abnormal cortisol level Assessment & Plan: I received test results which show that her cortisol levels were above 1.8 mcg following 1 mg of dexamethasone the night before.  This is a positive for nonsuppression.  We will order a 24-hour urine collection for free cortisol.  Patient notified via portal as we were unable to reach her over the phone.  Orders: -     Cortisol, urine, free      DIAGNOSTIC DATA REVIEWED:  BMET    Component Value Date/Time   NA 140 02/12/2022 1047   K 4.3 02/12/2022 1047   CL 99 02/12/2022 1047   CO2 25 02/12/2022 1047   GLUCOSE 115 (H) 02/12/2022 1047   GLUCOSE 131 (H) 12/02/2017 1619   BUN 18 02/12/2022 1047   CREATININE 0.72 02/12/2022 1047   CALCIUM 9.8 02/12/2022 1047   GFRNONAA 103 04/13/2019 0000   GFRAA 118 04/13/2019 0000   Lab Results  Component Value Date   HGBA1C 6.0 (H)  05/16/2022   HGBA1C 6.3 (H) 04/13/2019   Lab Results  Component Value Date   INSULIN 12.5 02/12/2022   INSULIN 15.1 12/16/2018   Lab Results  Component Value Date   TSH 0.988 02/12/2022   CBC    Component Value Date/Time   WBC 5.9 02/12/2022 1047   WBC 7.9 12/02/2017 1619   RBC 5.06 02/12/2022 1047   RBC 5.57 (H) 12/02/2017 1619   HGB 13.0 02/12/2022 1047   HCT 40.8 02/12/2022 1047   PLT 220 02/12/2022 1047   MCV 81 02/12/2022 1047   MCH 25.7 (L) 02/12/2022 1047   MCH 25.0 (L) 12/02/2017 1619   MCHC 31.9 02/12/2022 1047   MCHC 31.5 12/02/2017 1619   RDW 14.1 02/12/2022 1047   Iron Studies No results found for: "IRON", "TIBC", "FERRITIN", "IRONPCTSAT" Lipid Panel     Component Value Date/Time   CHOL 149 02/12/2022 1047   TRIG 119 02/12/2022 1047   HDL 52 02/12/2022 1047   CHOLHDL 2.9 02/12/2022 1047   LDLCALC 76 02/12/2022 1047   Hepatic Function Panel     Component Value Date/Time   PROT 6.9  02/12/2022 1047   ALBUMIN 4.4 02/12/2022 1047   AST 17 02/12/2022 1047   ALT 14 02/12/2022 1047   ALKPHOS 83 02/12/2022 1047   BILITOT 0.3 02/12/2022 1047      Component Value Date/Time   TSH 0.988 02/12/2022 1047   Nutritional Lab Results  Component Value Date   VD25OH 28.4 (L) 07/08/2022   VD25OH 16.3 (L) 02/12/2022   VD25OH 31.9 04/13/2019      Return in about 2 weeks (around 07/15/2022) for For Weight Mangement with Dr. Gerarda Fraction.Marland Kitchen She was informed of the importance of frequent follow up visits to maximize her success with intensive lifestyle modifications for her multiple health conditions.    ATTESTASTION STATEMENTS:  Reviewed by clinician on day of visit: allergies, medications, problem list, medical history, surgical history, family history, social history, and previous encounter notes.   Time spent on visit including pre-visit chart review and post-visit care and charting was 30 minutes and additional 15 minutes were spent interpreting abnormal dexamethasone suppression test and coordinating care.   Thomes Dinning, MD

## 2022-07-18 ENCOUNTER — Encounter (INDEPENDENT_AMBULATORY_CARE_PROVIDER_SITE_OTHER): Payer: Self-pay | Admitting: Internal Medicine

## 2022-07-18 ENCOUNTER — Ambulatory Visit (INDEPENDENT_AMBULATORY_CARE_PROVIDER_SITE_OTHER): Payer: BC Managed Care – PPO | Admitting: Internal Medicine

## 2022-07-18 VITALS — BP 132/84 | HR 84 | Temp 98.0°F | Ht 66.0 in | Wt 316.0 lb

## 2022-07-18 DIAGNOSIS — E1169 Type 2 diabetes mellitus with other specified complication: Secondary | ICD-10-CM | POA: Diagnosis not present

## 2022-07-18 DIAGNOSIS — Z6841 Body Mass Index (BMI) 40.0 and over, adult: Secondary | ICD-10-CM

## 2022-07-18 DIAGNOSIS — D179 Benign lipomatous neoplasm, unspecified: Secondary | ICD-10-CM | POA: Diagnosis not present

## 2022-07-18 DIAGNOSIS — R7989 Other specified abnormal findings of blood chemistry: Secondary | ICD-10-CM

## 2022-07-18 DIAGNOSIS — E669 Obesity, unspecified: Secondary | ICD-10-CM

## 2022-07-18 DIAGNOSIS — Z7985 Long-term (current) use of injectable non-insulin antidiabetic drugs: Secondary | ICD-10-CM

## 2022-07-18 DIAGNOSIS — Z7984 Long term (current) use of oral hypoglycemic drugs: Secondary | ICD-10-CM

## 2022-07-18 NOTE — Assessment & Plan Note (Signed)
Detected on CT of the abdomen and pelvis.

## 2022-07-18 NOTE — Assessment & Plan Note (Signed)
Her fasting blood sugars remain in the 170s to 180s even though her hemoglobin A1c is at goal.  I would like for her to check her blood sugars alternating between premeals and postprandials for 7 days prior to the next visit.  She had an abnormal dexamethasone suppression test which suggest the possibility of elevated cortisol levels.  She will continue on Ozempic, metformin.  She may benefit from a SGLT 2 drug in place of pioglitazone which is weight promoting.  She will look into cost and coverage and will make a decision at the next office visit.  We reviewed goals of care.  She will follow-up with her primary care team to update on diabetes preventive measures.

## 2022-07-18 NOTE — Assessment & Plan Note (Signed)
She has a dorsocervical fat pad and facial plethora.  Her abdominal striae are gray in color.  She has diabetes and also hypertension.  Her a.m. cortisol levels were above 1.8 mcg following 1 mg of dexamethasone the night before.  This is a positive for nonsuppression.  She has completed a 24-hour urine collection but results are not in the system.  We will have to contact [place].  I also reviewed previous imaging she has a presence of an angiomyolipoma in the right adrenal gland but no signs of an adrenal adenoma.  She had multiple questions regarding test results and workup.  These were addressed to the best of our ability.  If her 24-hour urine is abnormal we will refer her to endocrinology for further evaluation.

## 2022-07-18 NOTE — Progress Notes (Signed)
Office: [phone]  /  Fax: [phone]  WEIGHT SUMMARY AND BIOMETRICS  Vitals Temp: 98 F (36.7 C) BP: 132/84 Pulse Rate: 84 SpO2: 96 %   Anthropometric Measurements Height: '5\' 6"'$  (1.676 m) Weight: (!) 316 lb (143.3 kg) BMI (Calculated): 51.03 Weight at Last Visit: 320 lb Weight Lost Since Last Visit: 4 lb Starting Weight: 339 lb Total Weight Loss (lbs): 23 lb (10.4 kg) Peak Weight: 339 lb   Body Composition  Body Fat %: 56.7 % Fat Mass (lbs): 176.2 lbs Muscle Mass (lbs): 133 lbs Total Body Water (lbs): 99.2 lbs Visceral Fat Rating : 22    HPI  Chief Complaint: OBESITY  Karen [name] is here to discuss her progress with her obesity treatment plan. She is on the the Category 3 Plan and states she is following her eating plan approximately 80-85 % of the time. She states she is exercising 20-25 minutes 3 times per week.  Interval History:  Since last office visit she has lost 4 pounds. She reports good adherence to reduced calorie nutritional plan. She has been working on eating 3 meals a day, getting recommended amount of protein and practicing mindfulness around treats and calorie dense foods. '[x]'$ Denies '[]'$ Reports problems with appetite and hunger signals.  '[x]'$ Denies '[]'$ Reports problems with satiety and satiation.  '[x]'$ Denies '[]'$ Reports problems with eating patterns and portion control.  '[]'$ Denies '[x]'$ Reports abnormal cravings Barriers identified none.   Pharmacotherapy for weight loss: She is currently taking Ozempic with diabetes as the primary indication.   Weight promoting medications identified: Obesogenic diabetes medications.  ASSESSMENT AND PLAN  TREATMENT PLAN FOR OBESITY:  Recommended Dietary Goals  Karen [name] is currently in the action stage of change. As such, her goal is to continue weight management plan. She has agreed to: the Category 3 Plan.  Behavioral Intervention  We discussed the following Behavioral Modification Strategies today: increasing lean  protein intake, decreasing simple carbohydrates , increasing vegetables, increasing fiber rich foods, increasing water intake, work on meal planning and easy cooking plans, and reading food labels .  Additional resources provided today: None  Recommended Physical Activity Goals  Karen [name] has been advised to work up to 150 minutes of moderate intensity aerobic activity a week and strengthening exercises 2-3 times per week for cardiovascular health, weight loss maintenance and preservation of muscle mass.   She has agreed to :  '[]'$  Continue current level of physical activity  '[x]'$  Think about ways to increase physical activity '[]'$  Start strengthening exercises with a goal of 2-3 sessions a week  '[]'$  Start aerobic activity with a goal of 150 minutes a week at moderate intensity.  '[]'$  Increase the intensity, frequency or duration of strengthening exercises  '[x]'$  Increase the intensity, frequency or duration of aerobic exercises   '[]'$  Increase physical activity in their day and reduce sedentary time (increase NEAT). '[]'$  Work on scheduling and tracking physical activity.   Pharmacotherapy We discussed various medication options to help Karen [name] with her weight loss efforts and we both agreed to : continue current anti-obesity medication regimen  ASSOCIATED CONDITIONS ADDRESSED TODAY  Type 2 diabetes mellitus with other specified complication, without long-term current use of insulin (HCC) Assessment & Plan: Her fasting blood sugars remain in the 170s to 180s even though her hemoglobin A1c is at goal.  I would like for her to check her blood sugars alternating between premeals and postprandials for 7 days prior to the next visit.  She had an abnormal dexamethasone suppression test which suggest the possibility of  elevated cortisol levels.  She will continue on Ozempic, metformin.  She may benefit from a SGLT 2 drug in place of pioglitazone which is weight promoting.  She will look into cost and coverage and will  make a decision at the next office visit.  We reviewed goals of care.  She will follow-up with her primary care team to update on diabetes preventive measures.   Obesity, current BMI 51  Abnormal cortisol level Assessment & Plan: She has a dorsocervical fat pad and facial plethora.  Her abdominal striae are gray in color.  She has diabetes and also hypertension.  Her a.m. cortisol levels were above 1.8 mcg following 1 mg of dexamethasone the night before.  This is a positive for nonsuppression.  She has completed a 24-hour urine collection but results are not in the system.  We will have to contact [place].  I also reviewed previous imaging she has a presence of an angiomyolipoma in the right adrenal gland but no signs of an adrenal adenoma.  She had multiple questions regarding test results and workup.  These were addressed to the best of our ability.  If her 24-hour urine is abnormal we will refer her to endocrinology for further evaluation.    Angiomyolipoma Assessment & Plan: Detected on CT of the abdomen and pelvis.      PHYSICAL EXAM:  Blood pressure 132/84, pulse 84, temperature 98 F (36.7 C), height '5\' 6"'$  (1.676 m), weight (!) 316 lb (143.3 kg), last menstrual period 07/16/2016, SpO2 96 %. Body mass index is 51 kg/m.  General: She is overweight, cooperative, alert, well developed, and in no acute distress. PSYCH: Has normal mood, affect and thought process.   HEENT: EOMI, sclerae are anicteric. Lungs: Normal breathing effort, no conversational dyspnea. Extremities: No edema.  Neurologic: No gross sensory or motor deficits. No tremors or fasciculations noted.    DIAGNOSTIC DATA REVIEWED:  BMET    Component Value Date/Time   NA 140 02/12/2022 1047   K 4.3 02/12/2022 1047   CL 99 02/12/2022 1047   CO2 25 02/12/2022 1047   GLUCOSE 115 (H) 02/12/2022 1047   GLUCOSE 131 (H) 12/02/2017 1619   BUN 18 02/12/2022 1047   CREATININE 0.72 02/12/2022 1047   CALCIUM 9.8  02/12/2022 1047   GFRNONAA 103 04/13/2019 0000   GFRAA 118 04/13/2019 0000   Lab Results  Component Value Date   HGBA1C 6.0 (H) 05/16/2022   HGBA1C 6.3 (H) 04/13/2019   Lab Results  Component Value Date   INSULIN 12.5 02/12/2022   INSULIN 15.1 12/16/2018   Lab Results  Component Value Date   TSH 0.988 02/12/2022   CBC    Component Value Date/Time   WBC 5.9 02/12/2022 1047   WBC 7.9 12/02/2017 1619   RBC 5.06 02/12/2022 1047   RBC 5.57 (H) 12/02/2017 1619   HGB 13.0 02/12/2022 1047   HCT 40.8 02/12/2022 1047   PLT 220 02/12/2022 1047   MCV 81 02/12/2022 1047   MCH 25.7 (L) 02/12/2022 1047   MCH 25.0 (L) 12/02/2017 1619   MCHC 31.9 02/12/2022 1047   MCHC 31.5 12/02/2017 1619   RDW 14.1 02/12/2022 1047   Iron Studies No results found for: "IRON", "TIBC", "FERRITIN", "IRONPCTSAT" Lipid Panel     Component Value Date/Time   CHOL 149 02/12/2022 1047   TRIG 119 02/12/2022 1047   HDL 52 02/12/2022 1047   CHOLHDL 2.9 02/12/2022 1047   LDLCALC 76 02/12/2022 1047   Hepatic Function Panel  Component Value Date/Time   PROT 6.9 02/12/2022 1047   ALBUMIN 4.4 02/12/2022 1047   AST 17 02/12/2022 1047   ALT 14 02/12/2022 1047   ALKPHOS 83 02/12/2022 1047   BILITOT 0.3 02/12/2022 1047      Component Value Date/Time   TSH 0.988 02/12/2022 1047   Nutritional Lab Results  Component Value Date   VD25OH 28.4 (L) 07/08/2022   VD25OH 16.3 (L) 02/12/2022   VD25OH 31.9 04/13/2019     Return in about 2 years (around 07/17/2024).Marland Kitchen She was informed of the importance of frequent follow up visits to maximize her success with intensive lifestyle modifications for her multiple health conditions.   ATTESTASTION STATEMENTS:  Reviewed by clinician on day of visit: allergies, medications, problem list, medical history, surgical history, family history, social history, and previous encounter notes.   Time spent on visit including pre-visit chart review and post-visit care and  charting was 30 minutes.    Thomes Dinning, MD

## 2022-07-19 LAB — CORTISOL, URINE, FREE
Cortisol (Ur), Free: 29 ug/24 hr (ref 6–42)
Cortisol,F,ug/L,U: 16 ug/L

## 2022-07-25 DIAGNOSIS — R7989 Other specified abnormal findings of blood chemistry: Secondary | ICD-10-CM | POA: Diagnosis not present

## 2022-07-25 DIAGNOSIS — Z6841 Body Mass Index (BMI) 40.0 and over, adult: Secondary | ICD-10-CM | POA: Diagnosis not present

## 2022-07-25 DIAGNOSIS — E1169 Type 2 diabetes mellitus with other specified complication: Secondary | ICD-10-CM | POA: Diagnosis not present

## 2022-07-31 LAB — CORTISOL, SALIVARY: Salivary Cortisol, MS: 0.027 ug/dL

## 2022-08-08 ENCOUNTER — Ambulatory Visit (INDEPENDENT_AMBULATORY_CARE_PROVIDER_SITE_OTHER): Payer: BC Managed Care – PPO | Admitting: Internal Medicine

## 2022-08-08 VITALS — BP 136/82 | HR 77 | Temp 98.4°F | Ht 66.0 in | Wt 311.0 lb

## 2022-08-08 DIAGNOSIS — Z6841 Body Mass Index (BMI) 40.0 and over, adult: Secondary | ICD-10-CM

## 2022-08-08 DIAGNOSIS — E669 Obesity, unspecified: Secondary | ICD-10-CM | POA: Diagnosis not present

## 2022-08-08 DIAGNOSIS — G4733 Obstructive sleep apnea (adult) (pediatric): Secondary | ICD-10-CM

## 2022-08-08 DIAGNOSIS — E1169 Type 2 diabetes mellitus with other specified complication: Secondary | ICD-10-CM | POA: Diagnosis not present

## 2022-08-08 DIAGNOSIS — Z7984 Long term (current) use of oral hypoglycemic drugs: Secondary | ICD-10-CM

## 2022-08-08 DIAGNOSIS — R7989 Other specified abnormal findings of blood chemistry: Secondary | ICD-10-CM | POA: Diagnosis not present

## 2022-08-08 DIAGNOSIS — Z7985 Long-term (current) use of injectable non-insulin antidiabetic drugs: Secondary | ICD-10-CM

## 2022-08-08 MED ORDER — SEMAGLUTIDE (2 MG/DOSE) 8 MG/3ML ~~LOC~~ SOPN: 2.0000 mg | PEN_INJECTOR | SUBCUTANEOUS | 0 refills | Status: DC

## 2022-08-08 NOTE — Progress Notes (Signed)
Office: [phone]  /  Fax: [phone]  WEIGHT SUMMARY AND BIOMETRICS  Vitals Temp: 98.4 F (36.9 C) BP: 136/82 Pulse Rate: 77 SpO2: 97 %   Anthropometric Measurements Height: 5\' 6"  (1.676 m) Weight: (!) 311 lb (141.1 kg) BMI (Calculated): 50.22 Weight at Last Visit: 316 lb Weight Lost Since Last Visit: 5 lb Starting Weight: 339 lb Total Weight Loss (lbs): 28 lb (12.7 kg) Peak Weight: 339 lb   Body Composition  Body Fat %: 54 % Fat Mass (lbs): 168.2 lbs Muscle Mass (lbs): 136.2 lbs Total Body Water (lbs): 100.4 lbs Visceral Fat Rating : 20    HPI  Chief Complaint: OBESITY  Karen [name] is here to discuss her progress with her obesity treatment plan. She is on the the Category 3 Plan and states she is following her eating plan approximately 85 % of the time. She states she is exercising 30 minutes 1 times per week.  Interval History:  Since last office visit she has lost 5 pounds. She reports good adherence to reduced calorie nutritional plan. She has been working on not skipping meals, increasing protein at every meal, eating more fruits, eating more vegetables, drinking more water, avoiding and or reducing liquid calories, making healthier choices, and thinking of starting to exercise Denies problems with appetite and hunger signals.  Denies problems with satiety and satiation.  Denies problems with eating patterns and portion control.  Denies abnormal cravings   Barriers identified orthopedic problems or chronic pain affecting mobility.   Pharmacotherapy for weight loss: She is currently taking Metformin (off label use for incretin effect and / or insulin resistance and / or diabetes prevention)  and Ozempic with diabetes as the primary indication.    ASSESSMENT AND PLAN  TREATMENT PLAN FOR OBESITY:  Recommended Dietary Goals  Karen [name] is currently in the action stage of change. As such, her goal is to continue weight management plan. She has agreed to:  continue current plan  Behavioral Intervention  We discussed the following Behavioral Modification Strategies today: decreasing simple carbohydrates , increasing fiber rich foods, increasing water intake, and avoiding temptations and identifying enticing environmental cues.  Additional resources provided today: None  Recommended Physical Activity Goals  Karen [name] has been advised to work up to 150 minutes of moderate intensity aerobic activity a week and strengthening exercises 2-3 times per week for cardiovascular health, weight loss maintenance and preservation of muscle mass.   She has agreed to :  Think about ways to increase physical activity and start doing chair aerobics or strengthening at home.  She is hesitant about joining a gym due to possible move in her future.  Pharmacotherapy We discussed various medication options to help Karen [name] with her weight loss efforts and we both agreed to : continue current anti-obesity medication regimen  ASSOCIATED CONDITIONS ADDRESSED TODAY  Obstructive sleep apnea  Type 2 diabetes mellitus with other specified complication, without long-term current use of insulin (HCC) Assessment & Plan: Her fasting blood sugars remain in the 170s to 190s even though her hemoglobin A1c is at goal.  There is a discrepancy.  She would like to obtain a new glucometer.  She does not have control solution to determine if device is still calibrated.  I would like for her to check her blood sugars alternating between premeals and postprandials for 7 days prior to the next visit.  She will continue on Ozempic, metformin.  She may benefit from a SGLT 2 drug in place of pioglitazone which is weight  promoting.  Her insurance covers SGLT 2 drugs.  We reviewed goals of care.  She will follow-up with her primary care team to update on diabetes preventive measures.  Orders: -     Semaglutide (2 MG/DOSE); Inject 2 mg as directed once a week.  Dispense: 3 mL; Refill: 0  Abnormal  cortisol level Assessment & Plan: She had an abnormal low-dose dexamethasone suppression test.  Her follow-up 24-hour urinary for free cortisol was within normal limits.  We also completed a night salivary test which was also normal.  Her lack of suppression in the morning may be related to physiological hypercortisolism or having high body weight.  Considering other 2 test were completely normal I do not suspect she has primary hypercortisolism.  We will repeat 24-hour urine in 6 months or do a high-dose dexamethasone suppression test.      PHYSICAL EXAM:  Blood pressure 136/82, pulse 77, temperature 98.4 F (36.9 C), height 5\' 6"  (1.676 m), weight (!) 311 lb (141.1 kg), last menstrual period 07/16/2016, SpO2 97 %. Body mass index is 50.2 kg/m.  General: She is overweight, cooperative, alert, well developed, and in no acute distress. PSYCH: Has normal mood, affect and thought process.   HEENT: EOMI, sclerae are anicteric. Lungs: Normal breathing effort, no conversational dyspnea. Extremities: No edema.  Neurologic: No gross sensory or motor deficits. No tremors or fasciculations noted.    DIAGNOSTIC DATA REVIEWED:  BMET    Component Value Date/Time   NA 140 02/12/2022 1047   K 4.3 02/12/2022 1047   CL 99 02/12/2022 1047   CO2 25 02/12/2022 1047   GLUCOSE 115 (H) 02/12/2022 1047   GLUCOSE 131 (H) 12/02/2017 1619   BUN 18 02/12/2022 1047   CREATININE 0.72 02/12/2022 1047   CALCIUM 9.8 02/12/2022 1047   GFRNONAA 103 04/13/2019 0000   GFRAA 118 04/13/2019 0000   Lab Results  Component Value Date   HGBA1C 6.0 (H) 05/16/2022   HGBA1C 6.3 (H) 04/13/2019   Lab Results  Component Value Date   INSULIN 12.5 02/12/2022   INSULIN 15.1 12/16/2018   Lab Results  Component Value Date   TSH 0.988 02/12/2022   CBC    Component Value Date/Time   WBC 5.9 02/12/2022 1047   WBC 7.9 12/02/2017 1619   RBC 5.06 02/12/2022 1047   RBC 5.57 (H) 12/02/2017 1619   HGB 13.0 02/12/2022  1047   HCT 40.8 02/12/2022 1047   PLT 220 02/12/2022 1047   MCV 81 02/12/2022 1047   MCH 25.7 (L) 02/12/2022 1047   MCH 25.0 (L) 12/02/2017 1619   MCHC 31.9 02/12/2022 1047   MCHC 31.5 12/02/2017 1619   RDW 14.1 02/12/2022 1047   Iron Studies No results found for: "IRON", "TIBC", "FERRITIN", "IRONPCTSAT" Lipid Panel     Component Value Date/Time   CHOL 149 02/12/2022 1047   TRIG 119 02/12/2022 1047   HDL 52 02/12/2022 1047   CHOLHDL 2.9 02/12/2022 1047   LDLCALC 76 02/12/2022 1047   Hepatic Function Panel     Component Value Date/Time   PROT 6.9 02/12/2022 1047   ALBUMIN 4.4 02/12/2022 1047   AST 17 02/12/2022 1047   ALT 14 02/12/2022 1047   ALKPHOS 83 02/12/2022 1047   BILITOT 0.3 02/12/2022 1047      Component Value Date/Time   TSH 0.988 02/12/2022 1047   Nutritional Lab Results  Component Value Date   VD25OH 28.4 (L) 07/08/2022   VD25OH 16.3 (L) 02/12/2022   VD25OH 31.9  04/13/2019     Return in about 3 weeks (around 08/29/2022) for For Weight Mangement with Dr. Gerarda Fraction.Marland Kitchen She was informed of the importance of frequent follow up visits to maximize her success with intensive lifestyle modifications for her multiple health conditions.   ATTESTASTION STATEMENTS:  Reviewed by clinician on day of visit: allergies, medications, problem list, medical history, surgical history, family history, social history, and previous encounter notes.     Thomes Dinning, MD

## 2022-08-09 NOTE — Assessment & Plan Note (Signed)
She had an abnormal low-dose dexamethasone suppression test.  Her follow-up 24-hour urinary for free cortisol was within normal limits.  We also completed a night salivary test which was also normal.  Her lack of suppression in the morning may be related to physiological hypercortisolism or having high body weight.  Considering other 2 test were completely normal I do not suspect she has primary hypercortisolism.  We will repeat 24-hour urine in 6 months or do a high-dose dexamethasone suppression test.

## 2022-08-09 NOTE — Assessment & Plan Note (Signed)
Her fasting blood sugars remain in the 170s to 190s even though her hemoglobin A1c is at goal.  There is a discrepancy.  She would like to obtain a new glucometer.  She does not have control solution to determine if device is still calibrated.  I would like for her to check her blood sugars alternating between premeals and postprandials for 7 days prior to the next visit.  She will continue on Ozempic, metformin.  She may benefit from a SGLT 2 drug in place of pioglitazone which is weight promoting.  Her insurance covers SGLT 2 drugs.  We reviewed goals of care.  She will follow-up with her primary care team to update on diabetes preventive measures.

## 2022-09-02 ENCOUNTER — Encounter (INDEPENDENT_AMBULATORY_CARE_PROVIDER_SITE_OTHER): Payer: Self-pay | Admitting: Internal Medicine

## 2022-09-02 ENCOUNTER — Ambulatory Visit (INDEPENDENT_AMBULATORY_CARE_PROVIDER_SITE_OTHER): Payer: BC Managed Care – PPO | Admitting: Internal Medicine

## 2022-09-02 VITALS — BP 134/84 | HR 87 | Temp 98.4°F | Ht 66.0 in | Wt 308.0 lb

## 2022-09-02 DIAGNOSIS — Z6841 Body Mass Index (BMI) 40.0 and over, adult: Secondary | ICD-10-CM

## 2022-09-02 DIAGNOSIS — E669 Obesity, unspecified: Secondary | ICD-10-CM | POA: Diagnosis not present

## 2022-09-02 DIAGNOSIS — Z7984 Long term (current) use of oral hypoglycemic drugs: Secondary | ICD-10-CM

## 2022-09-02 DIAGNOSIS — E1169 Type 2 diabetes mellitus with other specified complication: Secondary | ICD-10-CM | POA: Diagnosis not present

## 2022-09-02 DIAGNOSIS — Z7985 Long-term (current) use of injectable non-insulin antidiabetic drugs: Secondary | ICD-10-CM

## 2022-09-02 MED ORDER — SEMAGLUTIDE (2 MG/DOSE) 8 MG/3ML ~~LOC~~ SOPN: 2.0000 mg | PEN_INJECTOR | SUBCUTANEOUS | 0 refills | Status: DC

## 2022-09-02 MED ORDER — EMPAGLIFLOZIN 10 MG PO TABS: 10.0000 mg | ORAL_TABLET | Freq: Every day | ORAL | 0 refills | Status: DC

## 2022-09-02 NOTE — Progress Notes (Signed)
Office: [phone]  /  Fax: [phone]  WEIGHT SUMMARY AND BIOMETRICS  Vitals Temp: 98.4 F (36.9 C) BP: 134/84 Pulse Rate: 87 SpO2: 98 %   Anthropometric Measurements Height:  (1.676 m) Weight: (!) 308 lb (139.7 kg) BMI (Calculated): 49.74 Weight at Last Visit: 311 lb Weight Lost Since Last Visit: 3 lb Starting Weight: 339 lb Total Weight Loss (lbs): 31 lb (14.1 kg) Peak Weight: 339 lb   Body Composition  Body Fat %: 53.9 % Fat Mass (lbs): 166.4 lbs Muscle Mass (lbs): 135 lbs Total Body Water (lbs): 98 lbs Visceral Fat Rating : 20    No data recorded Today's Visit #: 11  Starting Date: 02/12/22   HPI  Chief Complaint: OBESITY  Karen [name] is here to discuss her progress with her obesity treatment plan. She is on the the Category 3 Plan and states she is following her eating plan approximately 80 % of the time. She states she is exercising 30 minutes 2 times per week.  Interval History:  Since last office visit she has lost 3 pounds. She reports good adherence to reduced calorie nutritional plan. She has been working on not skipping meals, increasing protein intake at every meal, eating more fruits, eating more vegetables, drinking more water, avoiding and or reducing liquid calories, and making healthier choices Denies problems with appetite and hunger signals.  Denies problems with satiety and satiation.  Denies problems with eating patterns and portion control.  Denies abnormal cravings. Denies feeling deprived or restricted.   Barriers identified: strong hunger signals and appetite.   Pharmacotherapy for weight loss: She is currently taking Ozempic with diabetes as the primary indication.    ASSESSMENT AND PLAN  TREATMENT PLAN FOR OBESITY:  Recommended Dietary Goals  Karen [name] is currently in the action stage of change. As such, her goal is to continue weight management plan. She has agreed to: continue current plan  Behavioral  Intervention  We discussed the following Behavioral Modification Strategies today: increasing lean protein intake, decreasing simple carbohydrates , increasing vegetables, increasing lower glycemic fruits, increasing water intake, continue to practice mindfulness when eating, and planning for success.  Additional resources provided today: None  Recommended Physical Activity Goals  Karen [name] has been advised to work up to 150 minutes of moderate intensity aerobic activity a week and strengthening exercises 2-3 times per week for cardiovascular health, weight loss maintenance and preservation of muscle mass.   She has agreed to :  Think about ways to increase physical activity  Pharmacotherapy We discussed various medication options to help Karen [name] with her weight loss efforts and we both agreed to :  She will continue on metformin, Ozempic we will discontinue pioglitazone as this is weight promoting and start Jardiance 10 mg a day.  ASSOCIATED CONDITIONS ADDRESSED TODAY  Obesity, current BMI 49  Type 2 diabetes mellitus with other specified complication, without long-term current use of insulin Assessment & Plan: I reviewed home blood glucose monitoring.  She now has CGM.  She is still experiencing some elevations in the morning and also after dinner.  We will discontinue pioglitazone.  After discussion of benefits and side effect she will be started on Jardiance 10 mg a day.  I also informed patient that I would like for her to start transitioning her diabetes medications to her new PCP for better care coordination as they are not in our system.  Orders: -     Empagliflozin; Take 1 tablet (10 mg total) by mouth daily  before breakfast.  Dispense: 30 tablet; Refill: 0 -     Semaglutide (2 MG/DOSE); Inject 2 mg as directed once a week.  Dispense: 3 mL; Refill: 0     PHYSICAL EXAM:  Blood pressure 134/84, pulse 87, temperature 98.4 F (36.9 C), height  (1.676 m), weight (!) 308 lb (139.7  kg), last menstrual period 07/16/2016, SpO2 98 %. Body mass index is 49.71 kg/m.  General: She is overweight, cooperative, alert, well developed, and in no acute distress. PSYCH: Has normal mood, affect and thought process.   HEENT: EOMI, sclerae are anicteric. Lungs: Normal breathing effort, no conversational dyspnea. Extremities: No edema.  Neurologic: No gross sensory or motor deficits. No tremors or fasciculations noted.    DIAGNOSTIC DATA REVIEWED:  BMET    Component Value Date/Time   NA 140 02/12/2022 1047   K 4.3 02/12/2022 1047   CL 99 02/12/2022 1047   CO2 25 02/12/2022 1047   GLUCOSE 115 (H) 02/12/2022 1047   GLUCOSE 131 (H) 12/02/2017 1619   BUN 18 02/12/2022 1047   CREATININE 0.72 02/12/2022 1047   CALCIUM 9.8 02/12/2022 1047   GFRNONAA 103 04/13/2019 0000   GFRAA 118 04/13/2019 0000   Lab Results  Component Value Date   HGBA1C 6.0 (H) 05/16/2022   HGBA1C 6.3 (H) 04/13/2019   Lab Results  Component Value Date   INSULIN 12.5 02/12/2022   INSULIN 15.1 12/16/2018   Lab Results  Component Value Date   TSH 0.988 02/12/2022   CBC    Component Value Date/Time   WBC 5.9 02/12/2022 1047   WBC 7.9 12/02/2017 1619   RBC 5.06 02/12/2022 1047   RBC 5.57 (H) 12/02/2017 1619   HGB 13.0 02/12/2022 1047   HCT 40.8 02/12/2022 1047   PLT 220 02/12/2022 1047   MCV 81 02/12/2022 1047   MCH 25.7 (L) 02/12/2022 1047   MCH 25.0 (L) 12/02/2017 1619   MCHC 31.9 02/12/2022 1047   MCHC 31.5 12/02/2017 1619   RDW 14.1 02/12/2022 1047   Iron Studies No results found for: "IRON", "TIBC", "FERRITIN", "IRONPCTSAT" Lipid Panel     Component Value Date/Time   CHOL 149 02/12/2022 1047   TRIG 119 02/12/2022 1047   HDL 52 02/12/2022 1047   CHOLHDL 2.9 02/12/2022 1047   LDLCALC 76 02/12/2022 1047   Hepatic Function Panel     Component Value Date/Time   PROT 6.9 02/12/2022 1047   ALBUMIN 4.4 02/12/2022 1047   AST 17 02/12/2022 1047   ALT 14 02/12/2022 1047   ALKPHOS  83 02/12/2022 1047   BILITOT 0.3 02/12/2022 1047      Component Value Date/Time   TSH 0.988 02/12/2022 1047   Nutritional Lab Results  Component Value Date   VD25OH 28.4 (L) 07/08/2022   VD25OH 16.3 (L) 02/12/2022   VD25OH 31.9 04/13/2019     Return in about 3 weeks (around 09/23/2022) for For Weight Mangement with Dr. Rikki Spearing.Marland Kitchen She was informed of the importance of frequent follow up visits to maximize her success with intensive lifestyle modifications for her multiple health conditions.   ATTESTASTION STATEMENTS:  Reviewed by clinician on day of visit: allergies, medications, problem list, medical history, surgical history, family history, social history, and previous encounter notes.     Worthy Rancher, MD

## 2022-09-02 NOTE — Assessment & Plan Note (Signed)
I reviewed home blood glucose monitoring.  She now has CGM.  She is still experiencing some elevations in the morning and also after dinner.  We will discontinue pioglitazone.  After discussion of benefits and side effect she will be started on Jardiance 10 mg a day.  I also informed patient that I would like for her to start transitioning her diabetes medications to her new PCP for better care coordination as they are not in our system.

## 2022-09-17 DIAGNOSIS — E782 Mixed hyperlipidemia: Secondary | ICD-10-CM | POA: Diagnosis not present

## 2022-09-17 DIAGNOSIS — E538 Deficiency of other specified B group vitamins: Secondary | ICD-10-CM | POA: Diagnosis not present

## 2022-09-17 DIAGNOSIS — G4733 Obstructive sleep apnea (adult) (pediatric): Secondary | ICD-10-CM | POA: Diagnosis not present

## 2022-09-17 DIAGNOSIS — E1165 Type 2 diabetes mellitus with hyperglycemia: Secondary | ICD-10-CM | POA: Diagnosis not present

## 2022-09-23 ENCOUNTER — Ambulatory Visit (INDEPENDENT_AMBULATORY_CARE_PROVIDER_SITE_OTHER): Payer: BC Managed Care – PPO | Admitting: Internal Medicine

## 2022-09-23 ENCOUNTER — Encounter (INDEPENDENT_AMBULATORY_CARE_PROVIDER_SITE_OTHER): Payer: Self-pay | Admitting: Internal Medicine

## 2022-09-23 VITALS — BP 106/74 | HR 69 | Temp 97.8°F | Ht 66.0 in | Wt 300.0 lb

## 2022-09-23 DIAGNOSIS — G4733 Obstructive sleep apnea (adult) (pediatric): Secondary | ICD-10-CM | POA: Diagnosis not present

## 2022-09-23 DIAGNOSIS — E7800 Pure hypercholesterolemia, unspecified: Secondary | ICD-10-CM | POA: Diagnosis not present

## 2022-09-23 DIAGNOSIS — E669 Obesity, unspecified: Secondary | ICD-10-CM

## 2022-09-23 DIAGNOSIS — Z6841 Body Mass Index (BMI) 40.0 and over, adult: Secondary | ICD-10-CM

## 2022-09-23 DIAGNOSIS — Z7984 Long term (current) use of oral hypoglycemic drugs: Secondary | ICD-10-CM

## 2022-09-23 DIAGNOSIS — E1169 Type 2 diabetes mellitus with other specified complication: Secondary | ICD-10-CM

## 2022-09-23 DIAGNOSIS — Z7985 Long-term (current) use of injectable non-insulin antidiabetic drugs: Secondary | ICD-10-CM

## 2022-09-23 MED ORDER — EMPAGLIFLOZIN 10 MG PO TABS
10.00 mg | ORAL_TABLET | Freq: Every day | ORAL | 0 refills | Status: DC
Start: 2022-09-23 — End: 2022-11-19

## 2022-09-23 MED ORDER — SEMAGLUTIDE (2 MG/DOSE) 8 MG/3ML ~~LOC~~ SOPN
2.0000 mg | PEN_INJECTOR | SUBCUTANEOUS | 0 refills | Status: DC
Start: 2022-09-23 — End: 2022-10-21

## 2022-09-23 NOTE — Assessment & Plan Note (Signed)
Her most recent LDL was 114 and although improved still not at goal.  She would benefit from intensification of statin therapy.  I will defer this to her primary care team goal would be to get her LDL less than 100 closer to 70 to reduce cardiovascular risk.

## 2022-09-23 NOTE — Assessment & Plan Note (Addendum)
I reviewed labs in Care Everywhere.  Her CO2 was normal and hemoglobin A1c is 5.6.  Her LDL is 114 which is not at goal.  I reviewed home blood glucose monitoring.  Her blood sugars have improved she still having some postprandial excursions some times after lunch and dinner.  She may have a degree of pulsatile insulin deficiency.  She will continue on Ozempic 2 mg a week, Jardiance 10 mg a day and metformin.  She is no longer on pioglitazone that was discontinued last office visit.  I provided her with information regarding the glycemic index of foods.  I think she needs to start counting grams of carbs with some of her meals.  Her most recent A1c is in the 5 range according to patient this was done out of network so we will have to review those records.  She has been working with her primary care and states being updated on diabetes preventive measures.

## 2022-09-23 NOTE — Assessment & Plan Note (Signed)
On CPAP with reported good compliance. Continue PAP therapy. Weight loss of 15% or more may improve AHI.  Consider repeating sleep study when she reaches this benchmark.

## 2022-09-23 NOTE — Progress Notes (Signed)
Office: [phone]  /  Fax: [phone]  WEIGHT SUMMARY AND BIOMETRICS  Vitals Temp: 97.8 F (36.6 C) BP: 106/74 Pulse Rate: 69 SpO2: 97 %   Anthropometric Measurements Height: 5\' 6"  (1.676 m) Weight: 300 lb (136.1 kg) BMI (Calculated): 48.44 Weight at Last Visit: 308 lb Weight Lost Since Last Visit: 8 lb Starting Weight: 339 lb Total Weight Loss (lbs): 39 lb (17.7 kg) Peak Weight: 339 lb   Body Composition  Body Fat %: 53 % Fat Mass (lbs): 159.4 lbs Muscle Mass (lbs): 134 lbs Total Body Water (lbs): 98.8 lbs Visceral Fat Rating : 19    No data recorded Today's Visit #: 12  Starting Date: 02/12/22   HPI  Chief Complaint: OBESITY  Karen [name] is here to discuss her progress with her obesity treatment plan. She is on the the Category 3 Plan and states she is following her eating plan approximately 50 % of the time. She states she is not exercising.  Interval History:  Since last office visit she has [x]  lost []  maintained [] gained weight.  Last office visit we had started her on Jardiance for glycemic control.  She is tolerating medication well without any adverse effects.  She has been monitoring her blood glucose in the morning and also 2 hours after lunch and dinner.  These have been between 130's to 170's some evenings.  Adherence to nutrition plan :  []  Excellent []  Good [x]  Fair []  Suboptimal []  Variable []  Gradually implementing [] Has not started implementation  Nutritional: Has been: [x]  consuming fruits []  consuming vegetables []  consuming whole grains [x]  consuming recommended amount of protein [x]  drinking water or low calorie drinks [x]  reducing portions []  eating out less []  reducing consumption of processed foods []  consuming prepackaged meals []  using protein shakes as a meal [x]  reading food labels  []  tracking or journaling calories.     Orexigenic Control: [x] Denies [] Reports problems with appetite and hunger signals.  [x] Denies [] Reports  problems with satiety and satiation.  [x] Denies [] Reports problems with eating patterns and portion control.  [x] Denies [] Reports strong cravings for highly palatable foods [x] Denies [] Reports problems with feeling restricted or deprived  Stress levels: [x]  Low [] Medium [] High   Barriers identified: none.   Pharmacotherapy for weight loss: She is currently taking Ozempic with diabetes as the primary indication and SGLT2 .    ASSESSMENT AND PLAN  TREATMENT PLAN FOR OBESITY:  Recommended Dietary Goals  Karen [name] is currently in the action stage of change. As such, her goal is to continue weight management plan. She has agreed to: continue current plan  Behavioral Intervention  We discussed the following Behavioral Modification Strategies today: increasing lean protein intake, decreasing simple carbohydrates , increasing vegetables, increasing lower glycemic fruits, increasing fiber rich foods, increasing water intake, and planning for success.  Additional resources provided today: Handout on healthy eating and balanced plate and Handout on complex carbohydrates and lean sources of protein  Recommended Physical Activity Goals  Karen [name] has been advised to work up to 150 minutes of moderate intensity aerobic activity a week and strengthening exercises 2-3 times per week for cardiovascular health, weight loss maintenance and preservation of muscle mass.   She has agreed to :  Think about ways to increase daily physical activity and overcoming barriers to exercise  Pharmacotherapy We discussed various medication options to help Karen [name] with her weight loss efforts and we both agreed to : continue current anti-obesity medication regimen  ASSOCIATED CONDITIONS ADDRESSED TODAY  Obesity, current BMI  49  Type 2 diabetes mellitus with other specified complication, without long-term current use of insulin (HCC) Assessment & Plan: I reviewed labs in Care Everywhere.  Her CO2 was normal and  hemoglobin A1c is 5.6.  Her LDL is 114 which is not at goal.  I reviewed home blood glucose monitoring.  Her blood sugars have improved she still having some postprandial excursions some times after lunch and dinner.  She may have a degree of pulsatile insulin deficiency.  She will continue on Ozempic 2 mg a week, Jardiance 10 mg a day and metformin.  She is no longer on pioglitazone that was discontinued last office visit.  I provided her with information regarding the glycemic index of foods.  I think she needs to start counting grams of carbs with some of her meals.  Her most recent A1c is in the 5 range according to patient this was done out of network so we will have to review those records.  She has been working with her primary care and states being updated on diabetes preventive measures.  Orders: -     Empagliflozin; Take 1 tablet (10 mg total) by mouth daily before breakfast.  Dispense: 30 tablet; Refill: 0 -     Semaglutide (2 MG/DOSE); Inject 2 mg as directed once a week.  Dispense: 3 mL; Refill: 0  OSA on CPAP Assessment & Plan: On CPAP with reported good compliance. Continue PAP therapy. Weight loss of 15% or more may improve AHI.  Consider repeating sleep study when she reaches this benchmark.    Pure hypercholesterolemia Assessment & Plan: Her most recent LDL was 114 and although improved still not at goal.  She would benefit from intensification of statin therapy.  I will defer this to her primary care team goal would be to get her LDL less than 100 closer to 70 to reduce cardiovascular risk.     PHYSICAL EXAM:  Blood pressure 106/74, pulse 69, temperature 97.8 F (36.6 C), height 5\' 6"  (1.676 m), weight 300 lb (136.1 kg), last menstrual period 07/16/2016, SpO2 97 %. Body mass index is 48.42 kg/m.  General: She is overweight, cooperative, alert, well developed, and in no acute distress. PSYCH: Has normal mood, affect and thought process.   HEENT: EOMI, sclerae are  anicteric. Lungs: Normal breathing effort, no conversational dyspnea. Extremities: No edema.  Neurologic: No gross sensory or motor deficits. No tremors or fasciculations noted.    DIAGNOSTIC DATA REVIEWED:  BMET    Component Value Date/Time   NA 140 02/12/2022 1047   K 4.3 02/12/2022 1047   CL 99 02/12/2022 1047   CO2 25 02/12/2022 1047   GLUCOSE 115 (H) 02/12/2022 1047   GLUCOSE 131 (H) 12/02/2017 1619   BUN 18 02/12/2022 1047   CREATININE 0.72 02/12/2022 1047   CALCIUM 9.8 02/12/2022 1047   GFRNONAA 103 04/13/2019 0000   GFRAA 118 04/13/2019 0000   Lab Results  Component Value Date   HGBA1C 6.0 (H) 05/16/2022   HGBA1C 6.3 (H) 04/13/2019   Lab Results  Component Value Date   INSULIN 12.5 02/12/2022   INSULIN 15.1 12/16/2018   Lab Results  Component Value Date   TSH 0.988 02/12/2022   CBC    Component Value Date/Time   WBC 5.9 02/12/2022 1047   WBC 7.9 12/02/2017 1619   RBC 5.06 02/12/2022 1047   RBC 5.57 (H) 12/02/2017 1619   HGB 13.0 02/12/2022 1047   HCT 40.8 02/12/2022 1047   PLT 220 02/12/2022  1047   MCV 81 02/12/2022 1047   MCH 25.7 (L) 02/12/2022 1047   MCH 25.0 (L) 12/02/2017 1619   MCHC 31.9 02/12/2022 1047   MCHC 31.5 12/02/2017 1619   RDW 14.1 02/12/2022 1047   Iron Studies No results found for: "IRON", "TIBC", "FERRITIN", "IRONPCTSAT" Lipid Panel     Component Value Date/Time   CHOL 149 02/12/2022 1047   TRIG 119 02/12/2022 1047   HDL 52 02/12/2022 1047   CHOLHDL 2.9 02/12/2022 1047   LDLCALC 76 02/12/2022 1047   Hepatic Function Panel     Component Value Date/Time   PROT 6.9 02/12/2022 1047   ALBUMIN 4.4 02/12/2022 1047   AST 17 02/12/2022 1047   ALT 14 02/12/2022 1047   ALKPHOS 83 02/12/2022 1047   BILITOT 0.3 02/12/2022 1047      Component Value Date/Time   TSH 0.988 02/12/2022 1047   Nutritional Lab Results  Component Value Date   VD25OH 28.4 (L) 07/08/2022   VD25OH 16.3 (L) 02/12/2022   VD25OH 31.9 04/13/2019      Return in about 3 weeks (around 10/14/2022) for For Weight Mangement with Dr. Rikki Spearing.Marland Kitchen She was informed of the importance of frequent follow up visits to maximize her success with intensive lifestyle modifications for her multiple health conditions.   ATTESTASTION STATEMENTS:  Reviewed by clinician on day of visit: allergies, medications, problem list, medical history, surgical history, family history, social history, and previous encounter notes.   I have spent 40 minutes in the care of the patient today including: preparing to see patient (e.g. review and interpretation of tests, old notes ), obtaining and/or reviewing separately obtained history, counseling and educating the patient, ordering medications, test and procedures, documenting clinical information in the electronic or other health care record, and independently reviewing test results ordered by another physician.   Worthy Rancher, MD

## 2022-10-21 ENCOUNTER — Encounter (INDEPENDENT_AMBULATORY_CARE_PROVIDER_SITE_OTHER): Payer: Self-pay | Admitting: Internal Medicine

## 2022-10-21 ENCOUNTER — Ambulatory Visit (INDEPENDENT_AMBULATORY_CARE_PROVIDER_SITE_OTHER): Payer: BC Managed Care – PPO | Admitting: Internal Medicine

## 2022-10-21 VITALS — BP 110/72 | HR 83 | Temp 98.2°F | Ht 66.0 in | Wt 298.0 lb

## 2022-10-21 DIAGNOSIS — Z7984 Long term (current) use of oral hypoglycemic drugs: Secondary | ICD-10-CM

## 2022-10-21 DIAGNOSIS — E559 Vitamin D deficiency, unspecified: Secondary | ICD-10-CM | POA: Diagnosis not present

## 2022-10-21 DIAGNOSIS — E1169 Type 2 diabetes mellitus with other specified complication: Secondary | ICD-10-CM | POA: Diagnosis not present

## 2022-10-21 DIAGNOSIS — Z7985 Long-term (current) use of injectable non-insulin antidiabetic drugs: Secondary | ICD-10-CM

## 2022-10-21 DIAGNOSIS — E669 Obesity, unspecified: Secondary | ICD-10-CM | POA: Diagnosis not present

## 2022-10-21 DIAGNOSIS — Z6841 Body Mass Index (BMI) 40.0 and over, adult: Secondary | ICD-10-CM

## 2022-10-21 DIAGNOSIS — R7989 Other specified abnormal findings of blood chemistry: Secondary | ICD-10-CM | POA: Diagnosis not present

## 2022-10-21 MED ORDER — VITAMIN D (ERGOCALCIFEROL) 1.25 MG (50000 UNIT) PO CAPS
50000.0000 [IU] | ORAL_CAPSULE | ORAL | 0 refills | Status: AC
Start: 2022-10-21 — End: ?

## 2022-10-21 MED ORDER — SEMAGLUTIDE (2 MG/DOSE) 8 MG/3ML ~~LOC~~ SOPN
2.0000 mg | PEN_INJECTOR | SUBCUTANEOUS | 0 refills | Status: DC
Start: 2022-10-21 — End: 2022-10-21

## 2022-10-21 MED ORDER — SEMAGLUTIDE (2 MG/DOSE) 8 MG/3ML ~~LOC~~ SOPN
2.0000 mg | PEN_INJECTOR | SUBCUTANEOUS | 0 refills | Status: DC
Start: 2022-10-21 — End: 2022-11-19

## 2022-10-21 NOTE — Progress Notes (Signed)
Office: [phone]  /  Fax: [phone]  WEIGHT SUMMARY AND BIOMETRICS  Vitals Temp: 98.2 F (36.8 C) BP: 110/72 Pulse Rate: 83 SpO2: 97 %   Anthropometric Measurements Height: 5\' 6"  (1.676 m) Weight: 298 lb (135.2 kg) BMI (Calculated): 48.12 Weight at Last Visit: 200 lb Weight Lost Since Last Visit: 2 lb Starting Weight: 339 lb Total Weight Loss (lbs): 41 lb (18.6 kg) Peak Weight: 339 lb   Body Composition  Body Fat %: 52.8 % Fat Mass (lbs): 157.6 lbs Muscle Mass (lbs): 133.8 lbs Total Body Water (lbs): 95.6 lbs Visceral Fat Rating : 19    No data recorded Today's Visit #: 13  Starting Date: 02/12/22   HPI  Chief Complaint: OBESITY  Karen [name] is here to discuss her progress with her obesity treatment plan. She is on the the Category 3 Plan and states she is following her eating plan approximately 80 % of the time. She states she is exercising 15-20 minutes 2 times per week.  Interval History:  Since last office visit she has lost 2 lb. She reports good adherence to reduced calorie nutritional plan. She has been working on not skipping meals, increasing protein intake at every meal, eating more fruits, eating more vegetables, drinking more water, and making healthier choices Denies problems with appetite and hunger signals.  Denies problems with satiety and satiation.  Denies problems with eating patterns and portion control.  Denies abnormal cravings. Denies feeling deprived or restricted.   Barriers identified:  Low levels of physical activity .   Pharmacotherapy for weight loss: She is currently taking Metformin (off label use for incretin effect and / or insulin resistance and / or diabetes prevention) , Ozempic with diabetes as the primary indication, and SGLT2 .    ASSESSMENT AND PLAN  TREATMENT PLAN FOR OBESITY:  Recommended Dietary Goals  Karen [name] is currently in the action stage of change. As such, her goal is to continue weight management  plan. She has agreed to: keep a food journal with a target of  1500 calories and 90 grams of protein  and continue current plan  Behavioral Intervention  We discussed the following Behavioral Modification Strategies today: increasing lean protein intake, decreasing simple carbohydrates , increasing vegetables, increasing lower glycemic fruits, increasing fiber rich foods, increasing water intake, continue to practice mindfulness when eating, and planning for success.  Additional resources provided today: None  Recommended Physical Activity Goals  Karen [name] has been advised to work up to 150 minutes of moderate intensity aerobic activity a week and strengthening exercises 2-3 times per week for cardiovascular health, weight loss maintenance and preservation of muscle mass.   She has agreed to :  Think about ways to increase daily physical activity and overcoming barriers to exercise  Pharmacotherapy We discussed various medication options to help Karen [name] with her weight loss efforts and we both agreed to : continue current anti-obesity medication regimen  ASSOCIATED CONDITIONS ADDRESSED TODAY  Abnormal cortisol level Assessment & Plan: She had an abnormal low-dose dexamethasone suppression test in February of this year.  Her follow-up 24-hour urinary for free cortisol was within normal limits.  We also completed a night salivary test which was also normal.  Her lack of suppression in the morning may be related to physiological hypercortisolism or having high body weight.  Considering other 2 test were completely normal I do not suspect she has primary hypercortisolism.  We will repeat 24-hour urine in 6 months or do a high-dose dexamethasone suppression test.  Type 2 diabetes mellitus with other specified complication, without long-term current use of insulin (HCC) Assessment & Plan: Most recent A1c was 5.6.  She is currently on Ozempic 2 mg a week, Jardiance and metformin.  She denies any  symptoms of hypoglycemia.  She still noticing morning blood sugars in the 160s postprandials have been less than 170.  Blood pressure is well-controlled.  Most recent LDL was above 100 she may benefit from intensification of statin therapy I will defer this to primary care team.  Her goal LDL should be less than 70.  Orders: -     Semaglutide (2 MG/DOSE); Inject 2 mg as directed once a week.  Dispense: 3 mL; Refill: 0  Vitamin D deficiency Assessment & Plan: Most recent vitamin D levels  Lab Results  Component Value Date   VD25OH 28.4 (L) 07/08/2022   VD25OH 16.3 (L) 02/12/2022   VD25OH 31.9 04/13/2019     Deficiency state associated with adiposity and may result in leptin resistance, weight gain and fatigue. Currently on vitamin D supplementation without any adverse effects.  Plan: Check vitamin D levels today consider transitioning to over-the-counter supplementation if adequate   Orders: -     Vitamin D (Ergocalciferol); Take 1 capsule (50,000 Units total) by mouth every 7 (seven) days.  Dispense: 13 capsule; Refill: 0 -     VITAMIN D 25 Hydroxy (Vit-D Deficiency, Fractures)  Obesity, current BMI 49 Assessment & Plan: In addition to the above, she will work with prescribing physician on switching from clomipramine to weight neutral psychotropic medication for indication.  This will assist her further with weight loss.     PHYSICAL EXAM:  Blood pressure 110/72, pulse 83, temperature 98.2 F (36.8 C), height 5\' 6"  (1.676 m), weight 298 lb (135.2 kg), last menstrual period 07/16/2016, SpO2 97 %. Body mass index is 48.1 kg/m.  General: She is overweight, cooperative, alert, well developed, and in no acute distress. PSYCH: Has normal mood, affect and thought process.   HEENT: EOMI, sclerae are anicteric. Lungs: Normal breathing effort, no conversational dyspnea. Extremities: No edema.  Neurologic: No gross sensory or motor deficits. No tremors or fasciculations noted.     DIAGNOSTIC DATA REVIEWED:  BMET    Component Value Date/Time   NA 140 02/12/2022 1047   K 4.3 02/12/2022 1047   CL 99 02/12/2022 1047   CO2 25 02/12/2022 1047   GLUCOSE 115 (H) 02/12/2022 1047   GLUCOSE 131 (H) 12/02/2017 1619   BUN 18 02/12/2022 1047   CREATININE 0.72 02/12/2022 1047   CALCIUM 9.8 02/12/2022 1047   GFRNONAA 103 04/13/2019 0000   GFRAA 118 04/13/2019 0000   Lab Results  Component Value Date   HGBA1C 6.0 (H) 05/16/2022   HGBA1C 6.3 (H) 04/13/2019   Lab Results  Component Value Date   INSULIN 12.5 02/12/2022   INSULIN 15.1 12/16/2018   Lab Results  Component Value Date   TSH 0.988 02/12/2022   CBC    Component Value Date/Time   WBC 5.9 02/12/2022 1047   WBC 7.9 12/02/2017 1619   RBC 5.06 02/12/2022 1047   RBC 5.57 (H) 12/02/2017 1619   HGB 13.0 02/12/2022 1047   HCT 40.8 02/12/2022 1047   PLT 220 02/12/2022 1047   MCV 81 02/12/2022 1047   MCH 25.7 (L) 02/12/2022 1047   MCH 25.0 (L) 12/02/2017 1619   MCHC 31.9 02/12/2022 1047   MCHC 31.5 12/02/2017 1619   RDW 14.1 02/12/2022 1047   Iron Studies No  results found for: "IRON", "TIBC", "FERRITIN", "IRONPCTSAT" Lipid Panel     Component Value Date/Time   CHOL 149 02/12/2022 1047   TRIG 119 02/12/2022 1047   HDL 52 02/12/2022 1047   CHOLHDL 2.9 02/12/2022 1047   LDLCALC 76 02/12/2022 1047   Hepatic Function Panel     Component Value Date/Time   PROT 6.9 02/12/2022 1047   ALBUMIN 4.4 02/12/2022 1047   AST 17 02/12/2022 1047   ALT 14 02/12/2022 1047   ALKPHOS 83 02/12/2022 1047   BILITOT 0.3 02/12/2022 1047      Component Value Date/Time   TSH 0.988 02/12/2022 1047   Nutritional Lab Results  Component Value Date   VD25OH 28.4 (L) 07/08/2022   VD25OH 16.3 (L) 02/12/2022   VD25OH 31.9 04/13/2019     Return in about 4 weeks (around 11/18/2022).Marland Kitchen She was informed of the importance of frequent follow up visits to maximize her success with intensive lifestyle modifications for her  multiple health conditions.   ATTESTASTION STATEMENTS:  Reviewed by clinician on day of visit: allergies, medications, problem list, medical history, surgical history, family history, social history, and previous encounter notes.   I have spent 40 minutes in the care of the patient today including: preparing to see patient (e.g. review and interpretation of tests, old notes ), performing a medically appropriate examination or evaluation, counseling and educating the patient, ordering medications, test and procedures, documenting clinical information in the electronic or other health care record, and reviewing records in Care Everywhere   Worthy Rancher, MD

## 2022-10-21 NOTE — Assessment & Plan Note (Signed)
She had an abnormal low-dose dexamethasone suppression test in February of this year.  Her follow-up 24-hour urinary for free cortisol was within normal limits.  We also completed a night salivary test which was also normal.  Her lack of suppression in the morning may be related to physiological hypercortisolism or having high body weight.  Considering other 2 test were completely normal I do not suspect she has primary hypercortisolism.  We will repeat 24-hour urine in 6 months or do a high-dose dexamethasone suppression test.

## 2022-10-21 NOTE — Assessment & Plan Note (Signed)
Most recent A1c was 5.6.  She is currently on Ozempic 2 mg a week, Jardiance and metformin.  She denies any symptoms of hypoglycemia.  She still noticing morning blood sugars in the 160s postprandials have been less than 170.  Blood pressure is well-controlled.  Most recent LDL was above 100 she may benefit from intensification of statin therapy I will defer this to primary care team.  Her goal LDL should be less than 70.

## 2022-10-21 NOTE — Assessment & Plan Note (Signed)
In addition to the above, she will work with prescribing physician on switching from clomipramine to weight neutral psychotropic medication for indication.  This will assist her further with weight loss.

## 2022-10-21 NOTE — Assessment & Plan Note (Signed)
Most recent vitamin D levels  Lab Results  Component Value Date   VD25OH 28.4 (L) 07/08/2022   VD25OH 16.3 (L) 02/12/2022   VD25OH 31.9 04/13/2019     Deficiency state associated with adiposity and may result in leptin resistance, weight gain and fatigue. Currently on vitamin D supplementation without any adverse effects.  Plan: Check vitamin D levels today consider transitioning to over-the-counter supplementation if adequate

## 2022-10-22 LAB — VITAMIN D 25 HYDROXY (VIT D DEFICIENCY, FRACTURES): Vit D, 25-Hydroxy: 35.6 ng/mL (ref 30.0–100.0)

## 2022-10-28 ENCOUNTER — Other Ambulatory Visit (INDEPENDENT_AMBULATORY_CARE_PROVIDER_SITE_OTHER): Payer: Self-pay | Admitting: Internal Medicine

## 2022-10-28 ENCOUNTER — Telehealth (INDEPENDENT_AMBULATORY_CARE_PROVIDER_SITE_OTHER): Payer: Self-pay | Admitting: Internal Medicine

## 2022-10-28 DIAGNOSIS — E559 Vitamin D deficiency, unspecified: Secondary | ICD-10-CM

## 2022-10-28 NOTE — Telephone Encounter (Signed)
PA for Ozempic was denied as follows:Request Reference Number: [id number]. OZEMPIC INJ 8MG /3ML is denied for not meeting the prior authorization requirement(s).

## 2022-11-07 DIAGNOSIS — R0981 Nasal congestion: Secondary | ICD-10-CM | POA: Diagnosis not present

## 2022-11-07 DIAGNOSIS — U071 COVID-19: Secondary | ICD-10-CM | POA: Diagnosis not present

## 2022-11-07 DIAGNOSIS — R110 Nausea: Secondary | ICD-10-CM | POA: Diagnosis not present

## 2022-11-07 DIAGNOSIS — J029 Acute pharyngitis, unspecified: Secondary | ICD-10-CM | POA: Diagnosis not present

## 2022-11-11 ENCOUNTER — Other Ambulatory Visit (INDEPENDENT_AMBULATORY_CARE_PROVIDER_SITE_OTHER): Payer: Self-pay | Admitting: Internal Medicine

## 2022-11-11 ENCOUNTER — Telehealth (INDEPENDENT_AMBULATORY_CARE_PROVIDER_SITE_OTHER): Payer: Self-pay | Admitting: Internal Medicine

## 2022-11-11 DIAGNOSIS — E1169 Type 2 diabetes mellitus with other specified complication: Secondary | ICD-10-CM

## 2022-11-11 NOTE — Telephone Encounter (Signed)
Patient stated the insurance company is needing proof that she is on metformin and that it is working. Please advise

## 2022-11-11 NOTE — Telephone Encounter (Signed)
Patient requested information proving she is on Ozempic, and it is working be sent to her insurance company. I faxed notes from her last visit here to her insurance company. 11/11/22

## 2022-11-12 ENCOUNTER — Other Ambulatory Visit (INDEPENDENT_AMBULATORY_CARE_PROVIDER_SITE_OTHER): Payer: Self-pay | Admitting: Internal Medicine

## 2022-11-12 DIAGNOSIS — E1169 Type 2 diabetes mellitus with other specified complication: Secondary | ICD-10-CM

## 2022-11-12 NOTE — Telephone Encounter (Signed)
Patient called today and asked that we call her about her medication PA's instead of sending messages via MyChart. I also suggested that she call her insurance to follow up on her appeal.

## 2022-11-13 NOTE — Telephone Encounter (Signed)
Patient called today about her PA for Ozempic, said she spoke to her insurance and they said we were answering the questions wrong. They told her what was needed and I re-faxed everything again. Waiting now for a reply. 11/13/22  Patient is aware, I have spoke to her.

## 2022-11-19 ENCOUNTER — Ambulatory Visit (INDEPENDENT_AMBULATORY_CARE_PROVIDER_SITE_OTHER): Payer: BC Managed Care – PPO | Admitting: Internal Medicine

## 2022-11-19 ENCOUNTER — Encounter (INDEPENDENT_AMBULATORY_CARE_PROVIDER_SITE_OTHER): Payer: Self-pay | Admitting: Internal Medicine

## 2022-11-19 VITALS — BP 117/77 | HR 75 | Temp 98.4°F | Ht 66.0 in | Wt 298.0 lb

## 2022-11-19 DIAGNOSIS — E559 Vitamin D deficiency, unspecified: Secondary | ICD-10-CM

## 2022-11-19 DIAGNOSIS — Z6841 Body Mass Index (BMI) 40.0 and over, adult: Secondary | ICD-10-CM

## 2022-11-19 DIAGNOSIS — Z7984 Long term (current) use of oral hypoglycemic drugs: Secondary | ICD-10-CM

## 2022-11-19 DIAGNOSIS — R7989 Other specified abnormal findings of blood chemistry: Secondary | ICD-10-CM | POA: Diagnosis not present

## 2022-11-19 DIAGNOSIS — E1169 Type 2 diabetes mellitus with other specified complication: Secondary | ICD-10-CM | POA: Diagnosis not present

## 2022-11-19 DIAGNOSIS — E669 Obesity, unspecified: Secondary | ICD-10-CM

## 2022-11-19 DIAGNOSIS — E7800 Pure hypercholesterolemia, unspecified: Secondary | ICD-10-CM

## 2022-11-19 DIAGNOSIS — Z7985 Long-term (current) use of injectable non-insulin antidiabetic drugs: Secondary | ICD-10-CM

## 2022-11-19 MED ORDER — EMPAGLIFLOZIN 10 MG PO TABS
10.00 mg | ORAL_TABLET | Freq: Every day | ORAL | 0 refills | Status: AC
Start: 2022-11-19 — End: ?

## 2022-11-19 MED ORDER — SEMAGLUTIDE (2 MG/DOSE) 8 MG/3ML ~~LOC~~ SOPN
2.0000 mg | PEN_INJECTOR | SUBCUTANEOUS | 0 refills | Status: DC
Start: 2022-11-19 — End: 2022-12-10

## 2022-11-19 NOTE — Assessment & Plan Note (Signed)
Her most recent LDL was 114 and although improved still not at goal.  She would benefit from intensification of statin therapy.  She will discuss this with primary care physician at the next follow-up

## 2022-11-19 NOTE — Telephone Encounter (Signed)
Fax from [facility].  Ozempic 8 mg/3 mL has been approved.  Until 11/12/2023.

## 2022-11-19 NOTE — Assessment & Plan Note (Signed)
She had an abnormal low-dose dexamethasone suppression test in February of this year.  Her follow-up 24-hour urinary for free cortisol was within normal limits.  We also completed a night salivary test which was also normal.  Her lack of suppression in the morning may be related to physiological hypercortisolism or having high body weight.  Considering other 2 test were completely normal I do not suspect she has primary hypercortisolism.  We will repeat 24-hour urine collection for free cortisol.

## 2022-11-19 NOTE — Assessment & Plan Note (Signed)
Most recent A1c was 5.6 and improved.  She is currently on Ozempic 2 mg a week, Jardiance and metformin.  She denies any symptoms of hypoglycemia.  She still noticing morning blood sugars in the 160s postprandials have been less than 170.  Blood pressure is well-controlled.  Most recent LDL was above 100 she may benefit from intensification of statin therapy I will defer this to primary care team.  Her goal LDL should be less than 70.

## 2022-11-19 NOTE — Assessment & Plan Note (Signed)
She is currently working with her psychiatrist to come off clomipramine to help with weight loss.

## 2022-11-19 NOTE — Progress Notes (Signed)
Office: [phone]  /  Fax: [phone]  WEIGHT SUMMARY AND BIOMETRICS  Vitals Temp: 98.4 F (36.9 C) BP: 117/77 Pulse Rate: 75 SpO2: 97 %   Anthropometric Measurements Height: 5\' 6"  (1.676 m) Weight: 298 lb (135.2 kg) BMI (Calculated): 48.12 Weight at Last Visit: 298 lb Weight Lost Since Last Visit: 0 Starting Weight: 339 lb Total Weight Loss (lbs): 41 lb (18.6 kg) Peak Weight: 339 lb   Body Composition  Body Fat %: 52.4 % Fat Mass (lbs): 156.4 lbs Muscle Mass (lbs): 134.8 lbs Total Body Water (lbs): 96.6 lbs Visceral Fat Rating : 19    No data recorded Today's Visit #: 14  Starting Date: 02/12/22   HPI  Chief Complaint: OBESITY  Karen [name] is here to discuss her progress with her obesity treatment plan. She is on the the Category 3 Plan and states she is following her eating plan approximately 30 % of the time. She states she is walking some.  Interval History:  Since last office visit she has maintained. Went on vacation to [place] and got Covid. Reverted to ice cream and soups because of sore throat. Had been off ozempic for 2 weeks. Her clomipramine is being reduced, her fluvoxamine is being increased. She reports working on implementation of reduced calorie nutritional plan She has been working on getting back on track following vacation  Orixegenic Control: Denies problems with appetite and hunger signals.  Denies problems with satiety and satiation.  Denies problems with eating patterns and portion control.  Denies abnormal cravings. Denies feeling deprived or restricted.   Barriers identified: none and presence of obesogenic drugs.   Pharmacotherapy for weight loss: She is currently taking Ozempic with diabetes as the primary indication with adequate clinical response  and without side effects..    ASSESSMENT AND PLAN  TREATMENT PLAN FOR OBESITY:  Recommended Dietary Goals  Antisha is currently in the action stage of change. As such, her  goal is to continue weight management plan. She has agreed to: continue current plan  Behavioral Intervention  We discussed the following Behavioral Modification Strategies today: increasing lean protein intake, decreasing simple carbohydrates , increasing vegetables, increasing fiber rich foods, increasing water intake, continue to work on implementation of reduced calorie nutritional plan, continue to practice mindfulness when eating, planning for success, staying on track while traveling and vacationing, and reducing added and simple sugars in diet .  Additional resources provided today: None  Recommended Physical Activity Goals  Zabrina has been advised to work up to 150 minutes of moderate intensity aerobic activity a week and strengthening exercises 2-3 times per week for cardiovascular health, weight loss maintenance and preservation of muscle mass.   She has agreed to :  Think about ways to increase daily physical activity and overcoming barriers to exercise  Pharmacotherapy We discussed various medication options to help Oretta with her weight loss efforts and we both agreed to : continue current anti-obesity medication regimen  ASSOCIATED CONDITIONS ADDRESSED TODAY  Vitamin D deficiency  Type 2 diabetes mellitus with other specified complication, without long-term current use of insulin (HCC) Assessment & Plan: Most recent A1c was 5.6 and improved.  She is currently on Ozempic 2 mg a week, Jardiance and metformin.  She denies any symptoms of hypoglycemia.  She still noticing morning blood sugars in the 160s postprandials have been less than 170.  Blood pressure is well-controlled.  Most recent LDL was above 100 she may benefit from intensification of statin therapy I will defer  this to primary care team.  Her goal LDL should be less than 70.  Orders: -     Semaglutide (2 MG/DOSE); Inject 2 mg as directed once a week.  Dispense: 3 mL; Refill: 0 -     Empagliflozin; Take 1 tablet (10  mg total) by mouth daily before breakfast.  Dispense: 30 tablet; Refill: 0 -     Cortisol, urine, free  Abnormal cortisol level Assessment & Plan: She had an abnormal low-dose dexamethasone suppression test in February of this year.  Her follow-up 24-hour urinary for free cortisol was within normal limits.  We also completed a night salivary test which was also normal.  Her lack of suppression in the morning may be related to physiological hypercortisolism or having high body weight.  Considering other 2 test were completely normal I do not suspect she has primary hypercortisolism.  We will repeat 24-hour urine collection for free cortisol.   Obesity, current BMI 49 Assessment & Plan: She is currently working with her psychiatrist to come off clomipramine to help with weight loss.   Pure hypercholesterolemia Assessment & Plan: Her most recent LDL was 114 and although improved still not at goal.  She would benefit from intensification of statin therapy.  She will discuss this with primary care physician at the next follow-up     PHYSICAL EXAM:  Blood pressure 117/77, pulse 75, temperature 98.4 F (36.9 C), height 5\' 6"  (1.676 m), weight 298 lb (135.2 kg), last menstrual period 07/16/2016, SpO2 97 %. Body mass index is 48.1 kg/m.  General: She is overweight, cooperative, alert, well developed, and in no acute distress. PSYCH: Has normal mood, affect and thought process.   HEENT: EOMI, sclerae are anicteric. Lungs: Normal breathing effort, no conversational dyspnea. Extremities: No edema.  Neurologic: No gross sensory or motor deficits. No tremors or fasciculations noted.    DIAGNOSTIC DATA REVIEWED:  BMET    Component Value Date/Time   NA 140 02/12/2022 1047   K 4.3 02/12/2022 1047   CL 99 02/12/2022 1047   CO2 25 02/12/2022 1047   GLUCOSE 115 (H) 02/12/2022 1047   GLUCOSE 131 (H) 12/02/2017 1619   BUN 18 02/12/2022 1047   CREATININE 0.72 02/12/2022 1047   CALCIUM 9.8  02/12/2022 1047   GFRNONAA 103 04/13/2019 0000   GFRAA 118 04/13/2019 0000   Lab Results  Component Value Date   HGBA1C 6.0 (H) 05/16/2022   HGBA1C 6.3 (H) 04/13/2019   Lab Results  Component Value Date   INSULIN 12.5 02/12/2022   INSULIN 15.1 12/16/2018   Lab Results  Component Value Date   TSH 0.988 02/12/2022   CBC    Component Value Date/Time   WBC 5.9 02/12/2022 1047   WBC 7.9 12/02/2017 1619   RBC 5.06 02/12/2022 1047   RBC 5.57 (H) 12/02/2017 1619   HGB 13.0 02/12/2022 1047   HCT 40.8 02/12/2022 1047   PLT 220 02/12/2022 1047   MCV 81 02/12/2022 1047   MCH 25.7 (L) 02/12/2022 1047   MCH 25.0 (L) 12/02/2017 1619   MCHC 31.9 02/12/2022 1047   MCHC 31.5 12/02/2017 1619   RDW 14.1 02/12/2022 1047   Iron Studies No results found for: "IRON", "TIBC", "FERRITIN", "IRONPCTSAT" Lipid Panel     Component Value Date/Time   CHOL 149 02/12/2022 1047   TRIG 119 02/12/2022 1047   HDL 52 02/12/2022 1047   CHOLHDL 2.9 02/12/2022 1047   LDLCALC 76 02/12/2022 1047   Hepatic Function Panel  Component Value Date/Time   PROT 6.9 02/12/2022 1047   ALBUMIN 4.4 02/12/2022 1047   AST 17 02/12/2022 1047   ALT 14 02/12/2022 1047   ALKPHOS 83 02/12/2022 1047   BILITOT 0.3 02/12/2022 1047      Component Value Date/Time   TSH 0.988 02/12/2022 1047   Nutritional Lab Results  Component Value Date   VD25OH 35.6 10/21/2022   VD25OH 28.4 (L) 07/08/2022   VD25OH 16.3 (L) 02/12/2022     Return in about 3 weeks (around 12/10/2022) for For Weight Mangement with Dr. Rikki Spearing.Marland Kitchen She was informed of the importance of frequent follow up visits to maximize her success with intensive lifestyle modifications for her multiple health conditions.   ATTESTASTION STATEMENTS:  Reviewed by clinician on day of visit: allergies, medications, problem list, medical history, surgical history, family history, social history, and previous encounter notes.     Worthy Rancher, MD

## 2022-12-10 ENCOUNTER — Encounter (INDEPENDENT_AMBULATORY_CARE_PROVIDER_SITE_OTHER): Payer: Self-pay | Admitting: Internal Medicine

## 2022-12-10 ENCOUNTER — Ambulatory Visit (INDEPENDENT_AMBULATORY_CARE_PROVIDER_SITE_OTHER): Payer: BC Managed Care – PPO | Admitting: Internal Medicine

## 2022-12-10 VITALS — BP 129/80 | HR 80 | Temp 98.2°F | Ht 66.0 in | Wt 290.0 lb

## 2022-12-10 DIAGNOSIS — E669 Obesity, unspecified: Secondary | ICD-10-CM

## 2022-12-10 DIAGNOSIS — E1169 Type 2 diabetes mellitus with other specified complication: Secondary | ICD-10-CM

## 2022-12-10 DIAGNOSIS — Z7985 Long-term (current) use of injectable non-insulin antidiabetic drugs: Secondary | ICD-10-CM

## 2022-12-10 DIAGNOSIS — R7989 Other specified abnormal findings of blood chemistry: Secondary | ICD-10-CM

## 2022-12-10 DIAGNOSIS — G4733 Obstructive sleep apnea (adult) (pediatric): Secondary | ICD-10-CM | POA: Diagnosis not present

## 2022-12-10 DIAGNOSIS — Z6841 Body Mass Index (BMI) 40.0 and over, adult: Secondary | ICD-10-CM

## 2022-12-10 DIAGNOSIS — Z7984 Long term (current) use of oral hypoglycemic drugs: Secondary | ICD-10-CM

## 2022-12-10 MED ORDER — SEMAGLUTIDE (2 MG/DOSE) 8 MG/3ML ~~LOC~~ SOPN
2.0000 mg | PEN_INJECTOR | SUBCUTANEOUS | 0 refills | Status: DC
Start: 2022-12-10 — End: 2023-01-07

## 2022-12-10 NOTE — Assessment & Plan Note (Signed)
She is currently working with her psychiatrist to come off clomipramine to help with weight loss.  So far she has lost 51 pounds 19% of total body weight.  Her muscle loss under 25% goal less than 20%.  We emphasized the importance of consuming 30 to 40 g of protein per meal and I will have encouraged her to incorporate strengthening exercise for muscle mass preservation.  We will continue to monitor.  She will continue on incretin therapy, metformin and SGLT2 for the management of her diabetes and also weight.

## 2022-12-10 NOTE — Assessment & Plan Note (Signed)
On CPAP with reported good compliance. Continue PAP therapy. Weight loss of 15% or more may improve AHI.  Consider repeating sleep study when she reaches this benchmark.

## 2022-12-10 NOTE — Progress Notes (Unsigned)
Office: [phone]  /  Fax: [phone]  WEIGHT SUMMARY AND BIOMETRICS  Vitals Temp: 98.2 F (36.8 C) BP: 129/80 Pulse Rate: 80 SpO2: 95 %   Anthropometric Measurements Height: 5\' 6"  (1.676 m) Weight: 290 lb (131.5 kg) BMI (Calculated): 46.83 Weight at Last Visit: 298 lb Weight Lost Since Last Visit: 9 lb Starting Weight: 339 lb Total Weight Loss (lbs): 50 lb (22.7 kg) Peak Weight: 339 lb   Body Composition  Body Fat %: 51.7 % Fat Mass (lbs): 150 lbs Muscle Mass (lbs): 133.2 lbs Total Body Water (lbs): 93.8 lbs Visceral Fat Rating : 18    No data recorded Today's Visit #: 15  Starting Date: 02/13/23   HPI  Chief Complaint: OBESITY  Karen [name] is here to discuss her progress with her obesity treatment plan. She is on the the Category 3 Plan and states she is following her eating plan approximately 80 % of the time. She states she is exercising 20 minutes 2 times per week.  Interval History:  Since last office visit she has lost 8 lbs. She reports good adherence to reduced calorie nutritional plan. She has been working on not skipping meals, drinking more water, and begun to exercise.  Orixegenic Control: Denies problems with appetite and hunger signals.  Denies problems with satiety and satiation.  Denies problems with eating patterns and portion control.  Denies abnormal cravings. Denies feeling deprived or restricted.   Barriers identified:  Low volume of physical activity .   Pharmacotherapy for weight loss: She is currently taking Metformin (off label use for incretin effect and / or insulin resistance and / or diabetes prevention) with adequate clinical response  and without side effects., Ozempic with diabetes as the primary indication with adequate clinical response  and without side effects., and SGLT2 drug .    ASSESSMENT AND PLAN  TREATMENT PLAN FOR OBESITY:  Recommended Dietary Goals  [name] is currently in the action stage of change. As  such, her goal is to continue weight management plan. She has agreed to: continue current plan  Behavioral Intervention  We discussed the following Behavioral Modification Strategies today: increasing lean protein intake, decreasing simple carbohydrates , increasing vegetables, increasing lower glycemic fruits, increasing water intake, continue to practice mindfulness when eating, and planning for success.  Additional resources provided today: None  Recommended Physical Activity Goals  Britiney has been advised to work up to 150 minutes of moderate intensity aerobic activity a week and strengthening exercises 2-3 times per week for cardiovascular health, weight loss maintenance and preservation of muscle mass.   She has agreed to :  Think about ways to increase daily physical activity and overcoming barriers to exercise and we again reviewed the benefits of strengthening.  She has concerns regarding redundant skin.  She is also had some muscle loss.  Pharmacotherapy We discussed various medication options to help [name] with her weight loss efforts and we both agreed to : continue current anti-obesity medication regimen  ASSOCIATED CONDITIONS ADDRESSED TODAY  Obesity, current BMI 49 Assessment & Plan: She is currently working with her psychiatrist to come off clomipramine to help with weight loss.  So far she has lost 51 pounds 19% of total body weight.  Her muscle loss under 25% goal less than 20%.  We emphasized the importance of consuming 30 to 40 g of protein per meal and I will have encouraged her to incorporate strengthening exercise for muscle mass preservation.  We will continue to monitor.  She will  continue on incretin therapy, metformin and SGLT2 for the management of her diabetes and also weight.   Type 2 diabetes mellitus with other specified complication, without long-term current use of insulin (HCC) Assessment & Plan: Most recent A1c was 5.6 and improved.  She is currently on  Ozempic 2 mg a week, Jardiance and metformin.  She denies any symptoms of hypoglycemia.  She still noticing morning blood sugars in the 160s.  She has check some blood sugars in the morning around 5 AM and they have been around 110.  This is likely a result of dawn phenomenon.  We are repeating her 24-hour urine for cortisol in August.  Blood pressure is well-controlled.  Most recent LDL was above 100 she may benefit from intensification of statin therapy I will defer this to primary care team.  Her goal LDL should be less than 70.  Orders: -     Semaglutide (2 MG/DOSE); Inject 2 mg as directed once a week.  Dispense: 3 mL; Refill: 0  Abnormal cortisol level Assessment & Plan: She had an abnormal low-dose dexamethasone suppression test in February of this year.  Her follow-up 24-hour urinary for free cortisol was within normal limits.  We also completed a night salivary test which was also normal.  Her lack of suppression in the morning may be related to physiological hypercortisolism or having high body weight.  Considering other 2 test were completely normal I do not suspect she has primary hypercortisolism.  We will repeat 24-hour urine collection for free cortisol in August.  Order has been placed patient will be going to [facility].   OSA on CPAP Assessment & Plan: On CPAP with reported good compliance. Continue PAP therapy. Weight loss of 15% or more may improve AHI.  Consider repeating sleep study when she reaches this benchmark.      PHYSICAL EXAM:  Blood pressure 129/80, pulse 80, temperature 98.2 F (36.8 C), height 5\' 6"  (1.676 m), weight 290 lb (131.5 kg), last menstrual period 07/16/2016, SpO2 95%. Body mass index is 46.81 kg/m.  General: She is overweight, cooperative, alert, well developed, and in no acute distress. PSYCH: Has normal mood, affect and thought process.   HEENT: EOMI, sclerae are anicteric. Lungs: Normal breathing effort, no conversational dyspnea. Extremities:  No edema.  Neurologic: No gross sensory or motor deficits. No tremors or fasciculations noted.    DIAGNOSTIC DATA REVIEWED:  BMET    Component Value Date/Time   NA 140 02/12/2022 1047   K 4.3 02/12/2022 1047   CL 99 02/12/2022 1047   CO2 25 02/12/2022 1047   GLUCOSE 115 (H) 02/12/2022 1047   GLUCOSE 131 (H) 12/02/2017 1619   BUN 18 02/12/2022 1047   CREATININE 0.72 02/12/2022 1047   CALCIUM 9.8 02/12/2022 1047   GFRNONAA 103 04/13/2019 0000   GFRAA 118 04/13/2019 0000   Lab Results  Component Value Date   HGBA1C 6.0 (H) 05/16/2022   HGBA1C 6.3 (H) 04/13/2019   Lab Results  Component Value Date   INSULIN 12.5 02/12/2022   INSULIN 15.1 12/16/2018   Lab Results  Component Value Date   TSH 0.988 02/12/2022   CBC    Component Value Date/Time   WBC 5.9 02/12/2022 1047   WBC 7.9 12/02/2017 1619   RBC 5.06 02/12/2022 1047   RBC 5.57 (H) 12/02/2017 1619   HGB 13.0 02/12/2022 1047   HCT 40.8 02/12/2022 1047   PLT 220 02/12/2022 1047   MCV 81 02/12/2022 1047   MCH 25.7 (  L) 02/12/2022 1047   MCH 25.0 (L) 12/02/2017 1619   MCHC 31.9 02/12/2022 1047   MCHC 31.5 12/02/2017 1619   RDW 14.1 02/12/2022 1047   Iron Studies No results found for: "IRON", "TIBC", "FERRITIN", "IRONPCTSAT" Lipid Panel     Component Value Date/Time   CHOL 149 02/12/2022 1047   TRIG 119 02/12/2022 1047   HDL 52 02/12/2022 1047   CHOLHDL 2.9 02/12/2022 1047   LDLCALC 76 02/12/2022 1047   Hepatic Function Panel     Component Value Date/Time   PROT 6.9 02/12/2022 1047   ALBUMIN 4.4 02/12/2022 1047   AST 17 02/12/2022 1047   ALT 14 02/12/2022 1047   ALKPHOS 83 02/12/2022 1047   BILITOT 0.3 02/12/2022 1047      Component Value Date/Time   TSH 0.988 02/12/2022 1047   Nutritional Lab Results  Component Value Date   VD25OH 35.6 10/21/2022   VD25OH 28.4 (L) 07/08/2022   VD25OH 16.3 (L) 02/12/2022     Return in about 4 weeks (around 01/07/2023) for For Weight Mangement with Dr.  Rikki Spearing.Marland Kitchen She was informed of the importance of frequent follow up visits to maximize her success with intensive lifestyle modifications for her multiple health conditions.   ATTESTASTION STATEMENTS:  Reviewed by clinician on day of visit: allergies, medications, problem list, medical history, surgical history, family history, social history, and previous encounter notes.   I have spent 40 minutes in the care of the patient today including: preparing to see patient (e.g. review and interpretation of tests, old notes ), obtaining and/or reviewing separately obtained history, counseling and educating the patient, ordering medications, test and procedures, documenting clinical information in the electronic or other health care record, and independently interpreting results and communicating results to the patient,family, or caregiver   Worthy Rancher, MD

## 2022-12-10 NOTE — Assessment & Plan Note (Signed)
Most recent A1c was 5.6 and improved.  She is currently on Ozempic 2 mg a week, Jardiance and metformin.  She denies any symptoms of hypoglycemia.  She still noticing morning blood sugars in the 160s.  She has check some blood sugars in the morning around 5 AM and they have been around 110.  This is likely a result of dawn phenomenon.  We are repeating her 24-hour urine for cortisol in August.  Blood pressure is well-controlled.  Most recent LDL was above 100 she may benefit from intensification of statin therapy I will defer this to primary care team.  Her goal LDL should be less than 70.

## 2022-12-11 NOTE — Assessment & Plan Note (Signed)
She had an abnormal low-dose dexamethasone suppression test in February of this year.  Her follow-up 24-hour urinary for free cortisol was within normal limits.  We also completed a night salivary test which was also normal.  Her lack of suppression in the morning may be related to physiological hypercortisolism or having high body weight.  Considering other 2 test were completely normal I do not suspect she has primary hypercortisolism.  We will repeat 24-hour urine collection for free cortisol in August.  Order has been placed patient will be going to [facility].

## 2022-12-16 ENCOUNTER — Other Ambulatory Visit (INDEPENDENT_AMBULATORY_CARE_PROVIDER_SITE_OTHER): Payer: Self-pay | Admitting: Internal Medicine

## 2022-12-16 DIAGNOSIS — E1169 Type 2 diabetes mellitus with other specified complication: Secondary | ICD-10-CM

## 2023-01-02 DIAGNOSIS — H2513 Age-related nuclear cataract, bilateral: Secondary | ICD-10-CM | POA: Diagnosis not present

## 2023-01-02 DIAGNOSIS — E119 Type 2 diabetes mellitus without complications: Secondary | ICD-10-CM | POA: Diagnosis not present

## 2023-01-06 DIAGNOSIS — E1169 Type 2 diabetes mellitus with other specified complication: Secondary | ICD-10-CM | POA: Diagnosis not present

## 2023-01-07 ENCOUNTER — Encounter (INDEPENDENT_AMBULATORY_CARE_PROVIDER_SITE_OTHER): Payer: Self-pay | Admitting: Internal Medicine

## 2023-01-07 ENCOUNTER — Ambulatory Visit (INDEPENDENT_AMBULATORY_CARE_PROVIDER_SITE_OTHER): Payer: BC Managed Care – PPO | Admitting: Internal Medicine

## 2023-01-07 VITALS — BP 120/84 | HR 85 | Temp 98.2°F | Ht 66.0 in | Wt 286.0 lb

## 2023-01-07 DIAGNOSIS — E559 Vitamin D deficiency, unspecified: Secondary | ICD-10-CM

## 2023-01-07 DIAGNOSIS — Z7985 Long-term (current) use of injectable non-insulin antidiabetic drugs: Secondary | ICD-10-CM

## 2023-01-07 DIAGNOSIS — R7989 Other specified abnormal findings of blood chemistry: Secondary | ICD-10-CM | POA: Diagnosis not present

## 2023-01-07 DIAGNOSIS — E66813 Obesity, class 3: Secondary | ICD-10-CM

## 2023-01-07 DIAGNOSIS — Z6841 Body Mass Index (BMI) 40.0 and over, adult: Secondary | ICD-10-CM

## 2023-01-07 DIAGNOSIS — E1169 Type 2 diabetes mellitus with other specified complication: Secondary | ICD-10-CM

## 2023-01-07 DIAGNOSIS — Z7984 Long term (current) use of oral hypoglycemic drugs: Secondary | ICD-10-CM

## 2023-01-07 MED ORDER — SEMAGLUTIDE (2 MG/DOSE) 8 MG/3ML ~~LOC~~ SOPN: 2.0000 mg | PEN_INJECTOR | SUBCUTANEOUS | 1 refills | Status: DC

## 2023-01-07 NOTE — Progress Notes (Unsigned)
Office: [phone]  /  Fax: [phone]  WEIGHT SUMMARY AND BIOMETRICS  Vitals Temp: 98.2 F (36.8 C) BP: 120/84 Pulse Rate: 85 SpO2: 97 %   Anthropometric Measurements Height: 5\' 6"  (1.676 m) Weight: 286 lb (129.7 kg) BMI (Calculated): 46.18 Weight at Last Visit: 290 lb Weight Lost Since Last Visit: 4 lb Weight Gained Since Last Visit: 0 lb Starting Weight: 339 lb Total Weight Loss (lbs): 54 lb (24.5 kg) Peak Weight: 339 lb   Body Composition  Body Fat %: 51.8 % Fat Mass (lbs): 148.4 lbs Muscle Mass (lbs): 131 lbs Total Body Water (lbs): 94.4 lbs Visceral Fat Rating : 18    No data recorded Today's Visit #: 16  Starting Date: 02/12/22   HPI  Chief Complaint: OBESITY  Karen [name] is here to discuss her progress with her obesity treatment plan. She is on the the Category 3 Plan and states she is following her eating plan approximately 75-80 % of the time. She states she is exercising 90 minutes 2 times this past week.  Interval History:  Since last office visit she has lost 4 lbs. Started aquaeorbics at [employer].  She reports good adherence to reduced calorie nutritional plan. She has been working on [employer]: {ACTIONS;DENIES/REPORTS:21021675::"Denies"} problems with appetite and hunger signals.  {ACTIONS;DENIES/REPORTS:21021675::"Denies"} problems with satiety and satiation.  {ACTIONS;DENIES/REPORTS:21021675::"Denies"} problems with eating patterns and portion control.  {ACTIONS;DENIES/REPORTS:21021675::"Denies"} abnormal cravings. {ACTIONS;DENIES/REPORTS:21021675::"Denies"} feeling deprived or restricted.   Barriers identified: {EMOBESITYBARRIERS:28841}.   Pharmacotherapy for weight loss: She is currently taking {EMPharmaco:28845}.    ASSESSMENT AND PLAN  TREATMENT PLAN FOR OBESITY:  Recommended Dietary Goals  Hadie is currently in the action stage of change. As such, her goal is to continue weight management  plan. She has agreed to: {EMWTLOSSPLAN:29297::"continue current plan"}  Behavioral Intervention  We discussed the following Behavioral Modification Strategies today: {EMWMwtlossstrategies:28914::"increasing lean protein intake","decreasing simple carbohydrates ","increasing vegetables","increasing lower glycemic fruits","increasing water intake","continue to practice mindfulness when eating","planning for success"}.  Additional resources provided today: {EMadditionalresources:29169::"None"}  Recommended Physical Activity Goals  Nikkisha has been advised to work up to 150 minutes of moderate intensity aerobic activity a week and strengthening exercises 2-3 times per week for cardiovascular health, weight loss maintenance and preservation of muscle mass.   She has agreed to :  {EMEXERCISE:28847::"Think about ways to increase daily physical activity and overcoming barriers to exercise"}  Pharmacotherapy We discussed various medication options to help Doneen with her weight loss efforts and we both agreed to : {EMagreedrx:29170::"continue with nutritional and behavioral strategies"}  ASSOCIATED CONDITIONS ADDRESSED TODAY  Type 2 diabetes mellitus with other specified complication, without long-term current use of insulin (HCC)  Vitamin D deficiency    PHYSICAL EXAM:  Blood pressure 120/84, pulse 85, temperature 98.2 F (36.8 C), height 5\' 6"  (1.676 m), weight 286 lb (129.7 kg), last menstrual period 07/16/2016, SpO2 97%. Body mass index is 46.16 kg/m.  General: She is overweight, cooperative, alert, well developed, and in no acute distress. PSYCH: Has normal mood, affect and thought process.   HEENT: EOMI, sclerae are anicteric. Lungs: Normal breathing effort, no conversational dyspnea. Extremities: No edema.  Neurologic: No gross sensory or motor deficits. No tremors or fasciculations noted.    DIAGNOSTIC DATA REVIEWED:  BMET    Component Value Date/Time   NA 140 02/12/2022 1047    K 4.3 02/12/2022 1047   CL 99 02/12/2022 1047   CO2 25 02/12/2022 1047   GLUCOSE 115 (H) 02/12/2022 1047   GLUCOSE 131 (H)  12/02/2017 1619   BUN 18 02/12/2022 1047   CREATININE 0.72 02/12/2022 1047   CALCIUM 9.8 02/12/2022 1047   GFRNONAA 103 04/13/2019 0000   GFRAA 118 04/13/2019 0000   Lab Results  Component Value Date   HGBA1C 6.0 (H) 05/16/2022   HGBA1C 6.3 (H) 04/13/2019   Lab Results  Component Value Date   INSULIN 12.5 02/12/2022   INSULIN 15.1 12/16/2018   Lab Results  Component Value Date   TSH 0.988 02/12/2022   CBC    Component Value Date/Time   WBC 5.9 02/12/2022 1047   WBC 7.9 12/02/2017 1619   RBC 5.06 02/12/2022 1047   RBC 5.57 (H) 12/02/2017 1619   HGB 13.0 02/12/2022 1047   HCT 40.8 02/12/2022 1047   PLT 220 02/12/2022 1047   MCV 81 02/12/2022 1047   MCH 25.7 (L) 02/12/2022 1047   MCH 25.0 (L) 12/02/2017 1619   MCHC 31.9 02/12/2022 1047   MCHC 31.5 12/02/2017 1619   RDW 14.1 02/12/2022 1047   Iron Studies No results found for: "IRON", "TIBC", "FERRITIN", "IRONPCTSAT" Lipid Panel     Component Value Date/Time   CHOL 149 02/12/2022 1047   TRIG 119 02/12/2022 1047   HDL 52 02/12/2022 1047   CHOLHDL 2.9 02/12/2022 1047   LDLCALC 76 02/12/2022 1047   Hepatic Function Panel     Component Value Date/Time   PROT 6.9 02/12/2022 1047   ALBUMIN 4.4 02/12/2022 1047   AST 17 02/12/2022 1047   ALT 14 02/12/2022 1047   ALKPHOS 83 02/12/2022 1047   BILITOT 0.3 02/12/2022 1047      Component Value Date/Time   TSH 0.988 02/12/2022 1047   Nutritional Lab Results  Component Value Date   VD25OH 35.6 10/21/2022   VD25OH 28.4 (L) 07/08/2022   VD25OH 16.3 (L) 02/12/2022     No follow-ups on file.Marland Kitchen She was informed of the importance of frequent follow up visits to maximize her success with intensive lifestyle modifications for her multiple health conditions.   ATTESTASTION STATEMENTS:  Reviewed by clinician on day of visit: allergies,  medications, problem list, medical history, surgical history, family history, social history, and previous encounter notes.     Worthy Rancher, MD

## 2023-01-08 NOTE — Assessment & Plan Note (Signed)
Well-controlled no signs and symptoms of hypoglycemia.  Currently on semaglutide, metformin and Jardiance without any adverse effects.  She will continue current regimen she will work with her primary care team to stay up-to-date on diabetes preventive measures.  Blood pressure is well-controlled.

## 2023-01-08 NOTE — Assessment & Plan Note (Signed)
She is currently working with her psychiatrist to come off clomipramine to help with weight loss.  So far she has lost 54 pounds 19-20 % of total body weight.  Her muscle loss under 25% goal less than 20%.  We emphasized the importance of consuming 30 to 40 g of protein per meal and I will have encouraged her to incorporate strengthening exercise for muscle mass preservation.  We will continue to monitor.  She will continue on incretin therapy, metformin and SGLT2 for the management of her diabetes and also weight.  I will like her to resume journaling 3 to 4 days out of the week to ensure she is getting the correct amount of protein.  Patient was also given information on plateauing.

## 2023-01-08 NOTE — Assessment & Plan Note (Signed)
She had an abnormal low-dose dexamethasone suppression test in February of this year.  Her follow-up 24-hour urinary for free cortisol was within normal limits.  We also completed a night salivary test which was also normal.  Her lack of suppression in the morning may be related to physiological hypercortisolism or having high body weight.  Considering other 2 test were completely normal I do not suspect she has primary hypercortisolism.  We will repeat 24-hour urine collection for free cortisol and this has been completed results are pending.

## 2023-01-08 NOTE — Assessment & Plan Note (Signed)
Patient advised to complete current regimen of high-dose vitamin D and to transition to over-the-counter vitamin D3 supplementation 2000 units daily.

## 2023-01-10 LAB — CORTISOL, URINE, FREE: Cortisol,F,ug/L,U: 32 ug/L

## 2023-01-22 ENCOUNTER — Telehealth (INDEPENDENT_AMBULATORY_CARE_PROVIDER_SITE_OTHER): Payer: Self-pay | Admitting: Internal Medicine

## 2023-01-22 NOTE — Telephone Encounter (Signed)
Patient stated she could not reply to the message Dr.Maldonado sent her. She stated she does agree with his choice of sending her to an endocrinologist.

## 2023-02-13 ENCOUNTER — Encounter (INDEPENDENT_AMBULATORY_CARE_PROVIDER_SITE_OTHER): Payer: Self-pay | Admitting: Internal Medicine

## 2023-02-13 ENCOUNTER — Ambulatory Visit (INDEPENDENT_AMBULATORY_CARE_PROVIDER_SITE_OTHER): Payer: BC Managed Care – PPO | Admitting: Internal Medicine

## 2023-02-13 VITALS — BP 109/76 | HR 80 | Temp 97.8°F | Ht 66.0 in | Wt 277.0 lb

## 2023-02-13 DIAGNOSIS — E1169 Type 2 diabetes mellitus with other specified complication: Secondary | ICD-10-CM | POA: Diagnosis not present

## 2023-02-13 DIAGNOSIS — Z7985 Long-term (current) use of injectable non-insulin antidiabetic drugs: Secondary | ICD-10-CM

## 2023-02-13 DIAGNOSIS — E66813 Obesity, class 3: Secondary | ICD-10-CM | POA: Diagnosis not present

## 2023-02-13 DIAGNOSIS — R7989 Other specified abnormal findings of blood chemistry: Secondary | ICD-10-CM

## 2023-02-13 DIAGNOSIS — Z7984 Long term (current) use of oral hypoglycemic drugs: Secondary | ICD-10-CM

## 2023-02-13 DIAGNOSIS — M545 Low back pain, unspecified: Secondary | ICD-10-CM | POA: Diagnosis not present

## 2023-02-13 DIAGNOSIS — Z6841 Body Mass Index (BMI) 40.0 and over, adult: Secondary | ICD-10-CM

## 2023-02-13 MED ORDER — SEMAGLUTIDE (2 MG/DOSE) 8 MG/3ML ~~LOC~~ SOPN: 2.0000 mg | PEN_INJECTOR | SUBCUTANEOUS | 1 refills | Status: DC

## 2023-02-13 NOTE — Assessment & Plan Note (Signed)
Her most recent A1c is from [facility] back in May at 5.6.  Diabetes is well-controlled no signs and symptoms of hypoglycemia.  Currently on semaglutide, metformin and Jardiance without any adverse effects.  She will continue current regimen she will work with her primary care team to stay up-to-date on diabetes preventive measures.

## 2023-02-13 NOTE — Assessment & Plan Note (Signed)
She had an abnormal low-dose dexamethasone suppression test in February of this year.  Her follow-up 24-hour urinary for free cortisol was within normal limits.  We also completed a night salivary test which was also normal.  Her lack of suppression in the morning may be related to physiological hypercortisolism or having high body weight.  We did repeat a 24-hour urine for free cortisol and this time was mildly elevated.  I recommend referral to endocrinology for further evaluation.

## 2023-02-13 NOTE — Assessment & Plan Note (Signed)
 See obesity treatment plan

## 2023-02-13 NOTE — Progress Notes (Signed)
Office: [phone]  /  Fax: [phone]  WEIGHT SUMMARY AND BIOMETRICS  No data recorded Anthropometric Measurements Height: 5\' 6"  (1.676 m) Weight: 277 lb (125.6 kg) BMI (Calculated): 44.73 Weight at Last Visit: 286 lb Weight Lost Since Last Visit: 9 lb Starting Weight: 339 lb Total Weight Loss (lbs): 62 lb (28.1 kg) Peak Weight: 339 lb   Body Composition  Body Fat %: 49.8 % Fat Mass (lbs): 138 lbs Muscle Mass (lbs): 132.2 lbs Total Body Water (lbs): 89 lbs Visceral Fat Rating : 17    No data recorded Today's Visit #: 17  Starting Date: 02/12/22    Chief Complaint: OBESITY  HPI  Discussed the use of AI scribe software for clinical note transcription with the patient, who gave verbal consent to proceed.  History of Present Illness       Patient presents today for follow-up on obesity.  She is following category 3 plan about 80% of the time she has begun to do water aerobics for 90 minutes 2-3 times per week at the aquatic center.  Since last office visit she has lost 9 pounds.  She reports adequate hunger control and satiety on medically supervised weight loss plan.     Barriers identified: orthopedic problems, medical conditions or chronic pain affecting mobility.   Pharmacotherapy for weight loss: She is currently taking Metformin (off label use for incretin effect and / or insulin resistance and / or diabetes prevention) with adequate clinical response  and without side effects., Ozempic with diabetes as the primary indication with adequate clinical response  and without side effects., and SGLT2 medication .    ASSESSMENT AND PLAN  TREATMENT PLAN FOR OBESITY:  Recommended Dietary Goals  Karen [name] is currently in the action stage of change. As such, her goal is to continue weight management plan. She has agreed to: continue current plan  Behavioral Intervention  We discussed the following Behavioral Modification Strategies today: increasing lean protein  intake, increasing water intake, avoiding temptations and identifying enticing environmental cues, continue to practice mindfulness when eating, and planning for success.  Additional resources provided today: None  Recommended Physical Activity Goals  Karen [name] has been advised to work up to 150 minutes of moderate intensity aerobic activity a week and strengthening exercises 2-3 times per week for cardiovascular health, weight loss maintenance and preservation of muscle mass.   She has agreed to :  Continue current level of physical activity   Pharmacotherapy We discussed various medication options to help Karen [name] with her weight loss efforts and we both agreed to : continue current anti-obesity medication regimen  ASSOCIATED CONDITIONS ADDRESSED TODAY  Abnormal cortisol level Assessment & Plan: She had an abnormal low-dose dexamethasone suppression test in February of this year.  Her follow-up 24-hour urinary for free cortisol was within normal limits.  We also completed a night salivary test which was also normal.  Her lack of suppression in the morning may be related to physiological hypercortisolism or having high body weight.  We did repeat a 24-hour urine for free cortisol and this time was mildly elevated.  I recommend referral to endocrinology for further evaluation.  Orders: -     Ambulatory referral to Endocrinology  Type 2 diabetes mellitus with other specified complication, without long-term current use of insulin (HCC) Assessment & Plan: Her most recent A1c is from [facility] back in May at 5.6.  Diabetes is well-controlled no signs and symptoms of hypoglycemia.  Currently on semaglutide, metformin and Jardiance without any adverse  effects.  She will continue current regimen she will work with her primary care team to stay up-to-date on diabetes preventive measures.    Orders: -     Semaglutide (2 MG/DOSE); Inject 2 mg as directed once a week.  Dispense: 3 mL; Refill: 1  Acute  bilateral low back pain without sciatica  Class 3 severe obesity with serious comorbidity and body mass index (BMI) of 50.0 to 59.9 in adult, unspecified obesity type [facility]) Assessment & Plan: See obesity treatment plan      PHYSICAL EXAM:  Height 5\' 6"  (1.676 m), weight 277 lb (125.6 kg), last menstrual period 07/16/2016. Body mass index is 44.71 kg/m.  General: She is overweight, cooperative, alert, well developed, and in no acute distress. PSYCH: Has normal mood, affect and thought process.   HEENT: EOMI, sclerae are anicteric. Lungs: Normal breathing effort, no conversational dyspnea. Extremities: No edema.  Neurologic: No gross sensory or motor deficits. No tremors or fasciculations noted.    DIAGNOSTIC DATA REVIEWED:  BMET    Component Value Date/Time   NA 140 02/12/2022 1047   K 4.3 02/12/2022 1047   CL 99 02/12/2022 1047   CO2 25 02/12/2022 1047   GLUCOSE 115 (H) 02/12/2022 1047   GLUCOSE 131 (H) 12/02/2017 1619   BUN 18 02/12/2022 1047   CREATININE 0.72 02/12/2022 1047   CALCIUM 9.8 02/12/2022 1047   GFRNONAA 103 04/13/2019 0000   GFRAA 118 04/13/2019 0000   Lab Results  Component Value Date   HGBA1C 6.0 (H) 05/16/2022   HGBA1C 6.3 (H) 04/13/2019   Lab Results  Component Value Date   INSULIN 12.5 02/12/2022   INSULIN 15.1 12/16/2018   Lab Results  Component Value Date   TSH 0.988 02/12/2022   CBC    Component Value Date/Time   WBC 5.9 02/12/2022 1047   WBC 7.9 12/02/2017 1619   RBC 5.06 02/12/2022 1047   RBC 5.57 (H) 12/02/2017 1619   HGB 13.0 02/12/2022 1047   HCT 40.8 02/12/2022 1047   PLT 220 02/12/2022 1047   MCV 81 02/12/2022 1047   MCH 25.7 (L) 02/12/2022 1047   MCH 25.0 (L) 12/02/2017 1619   MCHC 31.9 02/12/2022 1047   MCHC 31.5 12/02/2017 1619   RDW 14.1 02/12/2022 1047   Iron Studies No results found for: "IRON", "TIBC", "FERRITIN", "IRONPCTSAT" Lipid Panel     Component Value Date/Time   CHOL 149 02/12/2022 1047   TRIG 119  02/12/2022 1047   HDL 52 02/12/2022 1047   CHOLHDL 2.9 02/12/2022 1047   LDLCALC 76 02/12/2022 1047   Hepatic Function Panel     Component Value Date/Time   PROT 6.9 02/12/2022 1047   ALBUMIN 4.4 02/12/2022 1047   AST 17 02/12/2022 1047   ALT 14 02/12/2022 1047   ALKPHOS 83 02/12/2022 1047   BILITOT 0.3 02/12/2022 1047      Component Value Date/Time   TSH 0.988 02/12/2022 1047   Nutritional Lab Results  Component Value Date   VD25OH 35.6 10/21/2022   VD25OH 28.4 (L) 07/08/2022   VD25OH 16.3 (L) 02/12/2022     Return in about 4 weeks (around 03/13/2023) for For Weight Mangement with Dr. Rikki Spearing.Marland Kitchen She was informed of the importance of frequent follow up visits to maximize her success with intensive lifestyle modifications for her multiple health conditions.   ATTESTASTION STATEMENTS:  Reviewed by clinician on day of visit: allergies, medications, problem list, medical history, surgical history, family history, social history, and previous encounter notes.  Worthy Rancher, MD

## 2023-02-17 ENCOUNTER — Telehealth (INDEPENDENT_AMBULATORY_CARE_PROVIDER_SITE_OTHER): Payer: Self-pay

## 2023-02-17 NOTE — Telephone Encounter (Signed)
Spoke with pt and advised referral sent to [facility], Dr. Jenelle Mages.  I will let her know date and time when it is received

## 2023-02-25 ENCOUNTER — Encounter (INDEPENDENT_AMBULATORY_CARE_PROVIDER_SITE_OTHER): Payer: Self-pay

## 2023-02-26 NOTE — Telephone Encounter (Signed)
Pt advised to call Dr. Garen Lah office to schedule appt, the referral has been completed

## 2023-02-28 DIAGNOSIS — E1165 Type 2 diabetes mellitus with hyperglycemia: Secondary | ICD-10-CM | POA: Diagnosis not present

## 2023-02-28 DIAGNOSIS — R112 Nausea with vomiting, unspecified: Secondary | ICD-10-CM | POA: Diagnosis not present

## 2023-02-28 DIAGNOSIS — R42 Dizziness and giddiness: Secondary | ICD-10-CM | POA: Diagnosis not present

## 2023-03-05 DIAGNOSIS — H8110 Benign paroxysmal vertigo, unspecified ear: Secondary | ICD-10-CM | POA: Diagnosis not present

## 2023-03-05 DIAGNOSIS — E1169 Type 2 diabetes mellitus with other specified complication: Secondary | ICD-10-CM | POA: Diagnosis not present

## 2023-03-05 DIAGNOSIS — H6123 Impacted cerumen, bilateral: Secondary | ICD-10-CM | POA: Diagnosis not present

## 2023-03-07 ENCOUNTER — Other Ambulatory Visit: Payer: Self-pay | Admitting: Adult Health Nurse Practitioner

## 2023-03-07 DIAGNOSIS — Z1231 Encounter for screening mammogram for malignant neoplasm of breast: Secondary | ICD-10-CM

## 2023-03-12 DIAGNOSIS — H8110 Benign paroxysmal vertigo, unspecified ear: Secondary | ICD-10-CM | POA: Diagnosis not present

## 2023-03-13 ENCOUNTER — Ambulatory Visit (INDEPENDENT_AMBULATORY_CARE_PROVIDER_SITE_OTHER): Payer: BC Managed Care – PPO | Admitting: Internal Medicine

## 2023-03-13 ENCOUNTER — Encounter (INDEPENDENT_AMBULATORY_CARE_PROVIDER_SITE_OTHER): Payer: Self-pay | Admitting: Internal Medicine

## 2023-03-13 VITALS — BP 128/78 | HR 86 | Temp 97.7°F | Ht 66.0 in | Wt 283.0 lb

## 2023-03-13 DIAGNOSIS — E66813 Obesity, class 3: Secondary | ICD-10-CM

## 2023-03-13 DIAGNOSIS — Z6841 Body Mass Index (BMI) 40.0 and over, adult: Secondary | ICD-10-CM

## 2023-03-13 DIAGNOSIS — E1169 Type 2 diabetes mellitus with other specified complication: Secondary | ICD-10-CM | POA: Diagnosis not present

## 2023-03-13 DIAGNOSIS — F5081 Binge eating disorder, mild: Secondary | ICD-10-CM

## 2023-03-13 DIAGNOSIS — Z7985 Long-term (current) use of injectable non-insulin antidiabetic drugs: Secondary | ICD-10-CM

## 2023-03-13 MED ORDER — SEMAGLUTIDE (2 MG/DOSE) 8 MG/3ML ~~LOC~~ SOPN: 2.0000 mg | PEN_INJECTOR | SUBCUTANEOUS | 1 refills | Status: DC

## 2023-03-13 NOTE — Progress Notes (Signed)
Office: [phone]  /  Fax: [phone]  WEIGHT SUMMARY AND BIOMETRICS  Vitals Temp: 97.7 F (36.5 C) BP: 128/78 Pulse Rate: 86 SpO2: 98 %   Anthropometric Measurements Height: 5\' 6"  (1.676 m) Weight: 283 lb (128.4 kg) BMI (Calculated): 45.7 Weight at Last Visit: 277 lb Weight Lost Since Last Visit: 0 lb Weight Gained Since Last Visit: 6 lb Starting Weight: 339 lb Total Weight Loss (lbs): 56 lb (25.4 kg) Peak Weight: 339 lb   Body Composition  Body Fat %: 50.6 % Fat Mass (lbs): 143.2 lbs Muscle Mass (lbs): 132.8 lbs Total Body Water (lbs): 91 lbs Visceral Fat Rating : 17    No data recorded Today's Visit #: 18  Starting Date: 02/12/22   HPI  Chief Complaint: OBESITY  Karen [name] is here to discuss her progress with her obesity treatment plan. She is on the the Category 3 Plan and states she is following her eating plan approximately 40-50 % of the time. She states she is  not exercising.  Interval History:  Since last office visit she has gained 6 pounds.  Discussed the use of AI scribe software for clinical note transcription with the patient, who gave verbal consent to proceed.  History of Present Illness   The patient presents today for weight management with a recent weight gain of six pounds. She attributes this to a recent course of prednisone, which she reports led to an insatiable appetite and constant feelings of hunger. 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 This has significantly impacted her physical activity, particularly her regular swimming routine, which she has been unable to continue due to the vertigo.  The patient admits to only adhering to her weight management plan about 40-50% of the time since her last visit, often opting for whatever food was easiest to access. She also reports a significant increase in fluid retention,  which she attributes to the prednisone. She expresses confidence in her ability to lose the recently gained weight once she is able to resume her regular physical activity and dietary habits.  The patient also mentions an upcoming appointment with an endocrinologist for mildly elevated cortisol levels, but does not believe this is contributing to her current symptoms. She expresses a desire to continue her current medication, Ozempic, but is open to considering alternatives if her appetite and portion sizes do not decrease.      Orexigenic Control: Reports problems with appetite and hunger signals.  Reports problems with satiety and satiation.  Reports problems with eating patterns and portion control.  Reports abnormal cravings. Denies feeling deprived or restricted.   Barriers identified: strong hunger signals and impaired satiety / inhibitory control and decrease in physical activity due to vertigo .   Pharmacotherapy for weight loss: She is currently taking Metformin (off label use for incretin effect and / or insulin resistance and / or diabetes prevention) with adequate clinical response  and without side effects., Ozempic with diabetes as the primary indication with adequate clinical response  and without side effects., and SGLT2 therapy .    ASSESSMENT AND PLAN  TREATMENT PLAN FOR OBESITY:  Recommended Dietary Goals  Karen [name] is currently in the action stage of change. As such, her goal is to continue weight management plan. She has agreed to: continue to work on [employer] of reduced calorie nutrition plan (RCNP)  Behavioral Intervention  We discussed the following  Behavioral Modification Strategies today: decreasing simple carbohydrates  and continue to work on maintaining a reduced calorie state, getting the recommended amount of protein, incorporating whole foods, making healthy choices, staying well hydrated and practicing mindfulness when eating..  Additional resources  provided today: None  Recommended Physical Activity Goals  Karen [name] has been advised to work up to 150 minutes of moderate intensity aerobic activity a week and strengthening exercises 2-3 times per week for cardiovascular health, weight loss maintenance and preservation of muscle mass.   She has agreed to :  Think about enjoyable ways to increase daily physical activity and overcoming barriers to exercise and Increase physical activity in their day and reduce sedentary time (increase NEAT).  Pharmacotherapy We discussed various medication options to help Karen [name] with her weight loss efforts and we both agreed to : continue current anti-obesity medication regimen  ASSOCIATED CONDITIONS ADDRESSED TODAY  Type 2 diabetes mellitus with other specified complication, without long-term current use of insulin [facility]) Assessment & Plan: Her most recent A1c is from [facility] back in May at 5.6.  Diabetes is well-controlled no signs and symptoms of hypoglycemia.  Currently on semaglutide, metformin and Jardiance without any adverse effects.  She will continue current regimen she will work with her primary care team to stay up-to-date on diabetes preventive measures.    We may consider switching to [facility] if we notice a waning effect on orexigenic signaling.  Orders: -     Semaglutide (2 MG/DOSE); Inject 2 mg as directed once a week.  Dispense: 3 mL; Refill: 1  Mild binge-eating disorder Assessment & Plan: Improved.  History of cognitive behavioral therapy in the past.  Has noticed an increase of cravings for sweets while being treated with prednisone.  We reviewed the importance of reducing mentation and enticing environments.   Class 3 severe obesity with serious comorbidity and body mass index (BMI) of 50.0 to 59.9 in adult, unspecified obesity type [facility]) Assessment & Plan: See obesity treatment plan    PHYSICAL EXAM:  Blood pressure 128/78, pulse 86, temperature 97.7 F (36.5 C), height 5\' 6"   (1.676 m), weight 283 lb (128.4 kg), last menstrual period 07/16/2016, SpO2 98%. Body mass index is 45.68 kg/m.  General: She is overweight, cooperative, alert, well developed, and in no acute distress. PSYCH: Has normal mood, affect and thought process.   HEENT: EOMI, sclerae are anicteric. Lungs: Normal breathing effort, no conversational dyspnea. Extremities: No edema.  Neurologic: No gross sensory or motor deficits. No tremors or fasciculations noted.    DIAGNOSTIC DATA REVIEWED:  BMET    Component Value Date/Time   NA 140 02/12/2022 1047   K 4.3 02/12/2022 1047   CL 99 02/12/2022 1047   CO2 25 02/12/2022 1047   GLUCOSE 115 (H) 02/12/2022 1047   GLUCOSE 131 (H) 12/02/2017 1619   BUN 18 02/12/2022 1047   CREATININE 0.72 02/12/2022 1047   CALCIUM 9.8 02/12/2022 1047   GFRNONAA 103 04/13/2019 0000   GFRAA 118 04/13/2019 0000   Lab Results  Component Value Date   HGBA1C 6.0 (H) 05/16/2022   HGBA1C 6.3 (H) 04/13/2019   Lab Results  Component Value Date   INSULIN 12.5 02/12/2022   INSULIN 15.1 12/16/2018   Lab Results  Component Value Date   TSH 0.988 02/12/2022   CBC    Component Value Date/Time   WBC 5.9 02/12/2022 1047   WBC 7.9 12/02/2017 1619   RBC 5.06 02/12/2022 1047   RBC 5.57 (H) 12/02/2017 1619  HGB 13.0 02/12/2022 1047   HCT 40.8 02/12/2022 1047   PLT 220 02/12/2022 1047   MCV 81 02/12/2022 1047   MCH 25.7 (L) 02/12/2022 1047   MCH 25.0 (L) 12/02/2017 1619   MCHC 31.9 02/12/2022 1047   MCHC 31.5 12/02/2017 1619   RDW 14.1 02/12/2022 1047   Iron Studies No results found for: "IRON", "TIBC", "FERRITIN", "IRONPCTSAT" Lipid Panel     Component Value Date/Time   CHOL 149 02/12/2022 1047   TRIG 119 02/12/2022 1047   HDL 52 02/12/2022 1047   CHOLHDL 2.9 02/12/2022 1047   LDLCALC 76 02/12/2022 1047   Hepatic Function Panel     Component Value Date/Time   PROT 6.9 02/12/2022 1047   ALBUMIN 4.4 02/12/2022 1047   AST 17 02/12/2022 1047   ALT  14 02/12/2022 1047   ALKPHOS 83 02/12/2022 1047   BILITOT 0.3 02/12/2022 1047      Component Value Date/Time   TSH 0.988 02/12/2022 1047   Nutritional Lab Results  Component Value Date   VD25OH 35.6 10/21/2022   VD25OH 28.4 (L) 07/08/2022   VD25OH 16.3 (L) 02/12/2022     Return in about 4 weeks (around 04/10/2023) for For Weight Mangement with Dr. Rikki Spearing.Marland Kitchen She was informed of the importance of frequent follow up visits to maximize her success with intensive lifestyle modifications for her multiple health conditions.   ATTESTASTION STATEMENTS:  Reviewed by clinician on day of visit: allergies, medications, problem list, medical history, surgical history, family history, social history, and previous encounter notes.     Worthy Rancher, MD

## 2023-03-13 NOTE — Assessment & Plan Note (Signed)
Improved.  History of cognitive behavioral therapy in the past.  Has noticed an increase of cravings for sweets while being treated with prednisone.  We reviewed the importance of reducing mentation and enticing environments.

## 2023-03-13 NOTE — Assessment & Plan Note (Signed)
 See obesity treatment plan

## 2023-03-13 NOTE — Assessment & Plan Note (Signed)
Her most recent A1c is from [facility] back in May at 5.6.  Diabetes is well-controlled no signs and symptoms of hypoglycemia.  Currently on semaglutide, metformin and Jardiance without any adverse effects.  She will continue current regimen she will work with her primary care team to stay up-to-date on diabetes preventive measures.    We may consider switching to [facility] if we notice a waning effect on orexigenic signaling.

## 2023-03-17 DIAGNOSIS — H8110 Benign paroxysmal vertigo, unspecified ear: Secondary | ICD-10-CM | POA: Diagnosis not present

## 2023-03-20 DIAGNOSIS — E1169 Type 2 diabetes mellitus with other specified complication: Secondary | ICD-10-CM | POA: Diagnosis not present

## 2023-03-20 DIAGNOSIS — Z130 Encounter for screening for diseases of the blood and blood-forming organs and certain disorders involving the immune mechanism: Secondary | ICD-10-CM | POA: Diagnosis not present

## 2023-03-20 DIAGNOSIS — E782 Mixed hyperlipidemia: Secondary | ICD-10-CM | POA: Diagnosis not present

## 2023-03-20 DIAGNOSIS — Z0000 Encounter for general adult medical examination without abnormal findings: Secondary | ICD-10-CM | POA: Diagnosis not present

## 2023-03-24 ENCOUNTER — Telehealth (INDEPENDENT_AMBULATORY_CARE_PROVIDER_SITE_OTHER): Payer: Self-pay

## 2023-03-24 ENCOUNTER — Telehealth (INDEPENDENT_AMBULATORY_CARE_PROVIDER_SITE_OTHER): Payer: Self-pay | Admitting: Internal Medicine

## 2023-03-24 NOTE — Telephone Encounter (Signed)
Karen [name] left a fax number for Karen [name] # [phone] Attn: Karen [name].Marland KitchenMarland Kitchen

## 2023-03-24 NOTE — Telephone Encounter (Signed)
Faxed all requested information, per patient, to Dr. Ocie Cornfield

## 2023-03-25 DIAGNOSIS — H8110 Benign paroxysmal vertigo, unspecified ear: Secondary | ICD-10-CM | POA: Diagnosis not present

## 2023-03-31 ENCOUNTER — Telehealth (INDEPENDENT_AMBULATORY_CARE_PROVIDER_SITE_OTHER): Payer: Self-pay

## 2023-03-31 NOTE — Telephone Encounter (Signed)
Spoke with patient and advised of appointment with Dr. Roanna Raider on 12/2/202024 @ 9am

## 2023-04-02 DIAGNOSIS — H8110 Benign paroxysmal vertigo, unspecified ear: Secondary | ICD-10-CM | POA: Diagnosis not present

## 2023-04-07 ENCOUNTER — Telehealth (INDEPENDENT_AMBULATORY_CARE_PROVIDER_SITE_OTHER): Payer: BC Managed Care – PPO | Admitting: Internal Medicine

## 2023-04-07 ENCOUNTER — Encounter (INDEPENDENT_AMBULATORY_CARE_PROVIDER_SITE_OTHER): Payer: Self-pay | Admitting: Internal Medicine

## 2023-04-07 VITALS — BP 111/68 | HR 85 | Ht 66.0 in | Wt 281.0 lb

## 2023-04-07 DIAGNOSIS — E1169 Type 2 diabetes mellitus with other specified complication: Secondary | ICD-10-CM | POA: Diagnosis not present

## 2023-04-07 DIAGNOSIS — R7989 Other specified abnormal findings of blood chemistry: Secondary | ICD-10-CM | POA: Diagnosis not present

## 2023-04-07 DIAGNOSIS — E7800 Pure hypercholesterolemia, unspecified: Secondary | ICD-10-CM | POA: Diagnosis not present

## 2023-04-07 DIAGNOSIS — E66813 Obesity, class 3: Secondary | ICD-10-CM | POA: Diagnosis not present

## 2023-04-07 DIAGNOSIS — Z7985 Long-term (current) use of injectable non-insulin antidiabetic drugs: Secondary | ICD-10-CM

## 2023-04-07 DIAGNOSIS — Z6841 Body Mass Index (BMI) 40.0 and over, adult: Secondary | ICD-10-CM

## 2023-04-07 MED ORDER — SEMAGLUTIDE (2 MG/DOSE) 8 MG/3ML ~~LOC~~ SOPN
2.0000 mg | PEN_INJECTOR | SUBCUTANEOUS | 1 refills | Status: DC
Start: 2023-04-07 — End: 2023-04-23

## 2023-04-07 MED ORDER — ROSUVASTATIN CALCIUM 10 MG PO TABS
10.0000 mg | ORAL_TABLET | Freq: Every day | ORAL | 1 refills | Status: AC
Start: 2023-04-07 — End: ?

## 2023-04-07 NOTE — Progress Notes (Signed)
Office: [phone]  /  Fax: [phone]  Weight Summary And Biometrics  Vitals BP: 111/68 Pulse Rate: 85   Anthropometric Measurements Height: 5\' 6"  (1.676 m) Weight: 281 lb (127.5 kg) BMI (Calculated): 45.38 Weight at Last Visit: 283 lb Starting Weight: 339 lb   No data recorded  No data recorded Today's Visit #: 19  Starting Date: 02/12/22   Karen [name] has verbally consented to this TeleHealth visit. The patient is located at home, the provider is located at the [facility] office. The participants in this visit include the listed provider and patient. The visit was conducted today via Careg[facility] - Video.   Subjective   Chief Complaint: Obesity  Karen [name] is here to discuss her progress with her obesity treatment plan. She is on the the Category 3 Plan and states she is following her eating plan approximately 75 % of the time. She states she is exercising 90 minutes 4 times per week.  Interval History:   Discussed the use of AI scribe software for clinical note transcription with the patient, who gave verbal consent to proceed.       Orexigenic Control:  Denies problems with appetite and hunger signals.  Denies problems with satiety and satiation.  Denies problems with eating patterns and portion control.  Denies abnormal cravings. Denies feeling deprived or restricted.   Barriers identified: none.   Pharmacotherapy for weight loss: She is currently taking Ozempic with diabetes as the primary indication with adequate clinical response  and without side effects..   Assessment and Plan   Treatment Plan For Obesity:  Recommended Dietary Goals  Karen [name] is currently in the action stage of change. As such, her goal is to continue weight management plan. She has agreed to: continue current plan  Behavioral Intervention  We discussed the following Behavioral Modification Strategies today: continue to work on maintaining a reduced calorie state,  getting the recommended amount of protein, incorporating whole foods, making healthy choices, staying well hydrated and practicing mindfulness when eating..  Additional resources provided today: None  Recommended Physical Activity Goals  Karen [name] has been advised to work up to 150 minutes of moderate intensity aerobic activity a week and strengthening exercises 2-3 times per week for cardiovascular health, weight loss maintenance and preservation of muscle mass.   She has agreed to :  Think about enjoyable ways to increase daily physical activity and overcoming barriers to exercise and Increase physical activity in their day and reduce sedentary time (increase NEAT).  Pharmacotherapy  We discussed various medication options to help Karen [name] with her weight loss efforts and we both agreed to : continue with nutritional and behavioral strategies  Associated Conditions Addressed Today  Pure hypercholesterolemia Assessment & Plan: I reviewed labs in Care Everywhere her LDL cholesterol is above target she is currently on atorvastatin 10 mg once a day.  We discussed cardiovascular risk and she would benefit from a moderate to high intensity statin.  After discussion of benefits and side effect she will be started on rosuvastatin 10 mg once a day goal will be to increase patient to 20 mg for an LDL cholesterol less than 70.  Orders: -     Rosuvastatin Calcium; Take 1 tablet (10 mg total) by mouth daily.  Dispense: 30 tablet; Refill: 1  Type 2 diabetes mellitus with other specified complication, without long-term current use of insulin (HCC) Assessment & Plan: I logged into care everywhere her most recent A1c is 6.9 which is increased.  She denies  any nutritional changes.  She did have some a few setbacks last 3 months including a course of steroids.  We will consider increasing her Jardiance.  She will continue on Ozempic at 2 mg once a week and metformin at 1000 mg twice a day.  Because of recent illness  I advised that she hold the metformin until she is feeling better.  Likely due to recent Thanksgiving meal and use of GLP-1.  Her LDL cholesterol is also elevated and primary care has not adjusted her statin therapy.  Orders: -     Semaglutide (2 MG/DOSE); Inject 2 mg as directed once a week.  Dispense: 3 mL; Refill: 1  Class 3 severe obesity with serious comorbidity and body mass index (BMI) of 50.0 to 59.9 in adult, unspecified obesity type Karen [name]) Assessment & Plan: Patient has lost 60 pounds since November of last year, her weight today is reported to be 2 pounds less.  She has been sick to her stomach following Thanksgiving dinner yesterday likely due to interaction between nutrition and GLP-1.  She will continue with medically supervised weight management plan including pharmacotherapy with GLP-1 and SGLT2.  She may delay injection of GLP-1 by 3 days if her symptoms have not resolved.  She has ondansetron on hand for nausea.  We reviewed nutritional ways to manage her symptoms.   Abnormal cortisol level Assessment & Plan: She had an abnormal low-dose dexamethasone suppression test in February of this year.  Her follow-up 24-hour urinary for free cortisol was within normal limits.  We also completed a night salivary test which was also normal.  Her lack of suppression in the morning may be related to physiological hypercortisolism or having high body weight.  We did repeat a 24-hour urine for free cortisol and this time was mildly elevated.  I recommend referral to endocrinology for further evaluation.  She has an appointment with Dr. Roanna Raider in December           Objective   Physical Exam:  Blood pressure 111/68, pulse 85, height 5\' 6"  (1.676 m), weight 281 lb (127.5 kg), last menstrual period 07/16/2016. Body mass index is 45.35 kg/m.  General: She is overweight, cooperative, alert, well developed, and in no acute distress. PSYCH: Has normal mood, affect and thought process.    HEENT: EOMI, sclerae are anicteric. Lungs: Normal breathing effort, no conversational dyspnea. Extremities: No edema.  Neurologic: No gross sensory or motor deficits. No tremors or fasciculations noted.    Diagnostic Data Reviewed:  BMET    Component Value Date/Time   NA 140 02/12/2022 1047   K 4.3 02/12/2022 1047   CL 99 02/12/2022 1047   CO2 25 02/12/2022 1047   GLUCOSE 115 (H) 02/12/2022 1047   GLUCOSE 131 (H) 12/02/2017 1619   BUN 18 02/12/2022 1047   CREATININE 0.72 02/12/2022 1047   CALCIUM 9.8 02/12/2022 1047   GFRNONAA 103 04/13/2019 0000   GFRAA 118 04/13/2019 0000   Lab Results  Component Value Date   HGBA1C 6.0 (H) 05/16/2022   HGBA1C 6.3 (H) 04/13/2019   Lab Results  Component Value Date   INSULIN 12.5 02/12/2022   INSULIN 15.1 12/16/2018   Lab Results  Component Value Date   TSH 0.988 02/12/2022   CBC    Component Value Date/Time   WBC 5.9 02/12/2022 1047   WBC 7.9 12/02/2017 1619   RBC 5.06 02/12/2022 1047   RBC 5.57 (H) 12/02/2017 1619   HGB 13.0 02/12/2022 1047   HCT 40.8  02/12/2022 1047   PLT 220 02/12/2022 1047   MCV 81 02/12/2022 1047   MCH 25.7 (L) 02/12/2022 1047   MCH 25.0 (L) 12/02/2017 1619   MCHC 31.9 02/12/2022 1047   MCHC 31.5 12/02/2017 1619   RDW 14.1 02/12/2022 1047   Iron Studies No results found for: "IRON", "TIBC", "FERRITIN", "IRONPCTSAT" Lipid Panel     Component Value Date/Time   CHOL 149 02/12/2022 1047   TRIG 119 02/12/2022 1047   HDL 52 02/12/2022 1047   CHOLHDL 2.9 02/12/2022 1047   LDLCALC 76 02/12/2022 1047   Hepatic Function Panel     Component Value Date/Time   PROT 6.9 02/12/2022 1047   ALBUMIN 4.4 02/12/2022 1047   AST 17 02/12/2022 1047   ALT 14 02/12/2022 1047   ALKPHOS 83 02/12/2022 1047   BILITOT 0.3 02/12/2022 1047      Component Value Date/Time   TSH 0.988 02/12/2022 1047   Nutritional Lab Results  Component Value Date   VD25OH 35.6 10/21/2022   VD25OH 28.4 (L) 07/08/2022   VD25OH  16.3 (L) 02/12/2022    Follow-Up   Return in about 4 weeks (around 05/05/2023).Marland Kitchen She was informed of the importance of frequent follow up visits to maximize her success with intensive lifestyle modifications for her multiple health conditions.  Attestation Statement   Reviewed by clinician on day of visit: allergies, medications, problem list, medical history, surgical history, family history, social history, and previous encounter notes.     Worthy Rancher, MD

## 2023-04-07 NOTE — Assessment & Plan Note (Signed)
She had an abnormal low-dose dexamethasone suppression test in February of this year.  Her follow-up 24-hour urinary for free cortisol was within normal limits.  We also completed a night salivary test which was also normal.  Her lack of suppression in the morning may be related to physiological hypercortisolism or having high body weight.  We did repeat a 24-hour urine for free cortisol and this time was mildly elevated.  I recommend referral to endocrinology for further evaluation.  She has an appointment with Dr. Roanna Raider in December

## 2023-04-07 NOTE — Assessment & Plan Note (Signed)
I reviewed labs in Care Everywhere her LDL cholesterol is above target she is currently on atorvastatin 10 mg once a day.  We discussed cardiovascular risk and she would benefit from a moderate to high intensity statin.  After discussion of benefits and side effect she will be started on rosuvastatin 10 mg once a day goal will be to increase patient to 20 mg for an LDL cholesterol less than 70.

## 2023-04-07 NOTE — Assessment & Plan Note (Signed)
I logged into care everywhere her most recent A1c is 6.9 which is increased.  She denies any nutritional changes.  She did have some a few setbacks last 3 months including a course of steroids.  We will consider increasing her Jardiance.  She will continue on Ozempic at 2 mg once a week and metformin at 1000 mg twice a day.  Because of recent illness I advised that she hold the metformin until she is feeling better.  Likely due to recent Thanksgiving meal and use of GLP-1.  Her LDL cholesterol is also elevated and primary care has not adjusted her statin therapy.

## 2023-04-07 NOTE — Assessment & Plan Note (Signed)
Patient has lost 60 pounds since November of last year, her weight today is reported to be 2 pounds less.  She has been sick to her stomach following Thanksgiving dinner yesterday likely due to interaction between nutrition and GLP-1.  She will continue with medically supervised weight management plan including pharmacotherapy with GLP-1 and SGLT2.  She may delay injection of GLP-1 by 3 days if her symptoms have not resolved.  She has ondansetron on hand for nausea.  We reviewed nutritional ways to manage her symptoms.

## 2023-04-08 DIAGNOSIS — H8110 Benign paroxysmal vertigo, unspecified ear: Secondary | ICD-10-CM | POA: Diagnosis not present

## 2023-04-17 ENCOUNTER — Ambulatory Visit: Payer: Self-pay

## 2023-04-17 DIAGNOSIS — E270 Other adrenocortical overactivity: Secondary | ICD-10-CM | POA: Diagnosis not present

## 2023-04-21 ENCOUNTER — Encounter (INDEPENDENT_AMBULATORY_CARE_PROVIDER_SITE_OTHER): Payer: Self-pay | Admitting: Internal Medicine

## 2023-04-23 ENCOUNTER — Telehealth (INDEPENDENT_AMBULATORY_CARE_PROVIDER_SITE_OTHER): Payer: BC Managed Care – PPO | Admitting: Internal Medicine

## 2023-04-23 ENCOUNTER — Encounter (INDEPENDENT_AMBULATORY_CARE_PROVIDER_SITE_OTHER): Payer: Self-pay | Admitting: Internal Medicine

## 2023-04-23 ENCOUNTER — Ambulatory Visit
Admission: RE | Admit: 2023-04-23 | Discharge: 2023-04-23 | Disposition: A | Discharge status: Home / Self Care | Payer: BC Managed Care – PPO | Source: Ambulatory Visit | Attending: Adult Health Nurse Practitioner | Admitting: Adult Health Nurse Practitioner

## 2023-04-23 VITALS — BP 111/60 | Ht 66.0 in | Wt 291.0 lb

## 2023-04-23 DIAGNOSIS — R638 Other symptoms and signs concerning food and fluid intake: Secondary | ICD-10-CM | POA: Diagnosis not present

## 2023-04-23 DIAGNOSIS — Z7985 Long-term (current) use of injectable non-insulin antidiabetic drugs: Secondary | ICD-10-CM

## 2023-04-23 DIAGNOSIS — E66813 Obesity, class 3: Secondary | ICD-10-CM

## 2023-04-23 DIAGNOSIS — E1169 Type 2 diabetes mellitus with other specified complication: Secondary | ICD-10-CM

## 2023-04-23 DIAGNOSIS — R7989 Other specified abnormal findings of blood chemistry: Secondary | ICD-10-CM | POA: Diagnosis not present

## 2023-04-23 DIAGNOSIS — Z6841 Body Mass Index (BMI) 40.0 and over, adult: Secondary | ICD-10-CM

## 2023-04-23 DIAGNOSIS — Z1231 Encounter for screening mammogram for malignant neoplasm of breast: Secondary | ICD-10-CM | POA: Diagnosis not present

## 2023-04-23 MED ORDER — TIRZEPATIDE 5 MG/0.5ML ~~LOC~~ SOAJ: 5.0000 mg | SUBCUTANEOUS | 0 refills | Status: DC

## 2023-04-24 DIAGNOSIS — K219 Gastro-esophageal reflux disease without esophagitis: Secondary | ICD-10-CM | POA: Diagnosis not present

## 2023-04-24 DIAGNOSIS — K582 Mixed irritable bowel syndrome: Secondary | ICD-10-CM | POA: Diagnosis not present

## 2023-04-24 DIAGNOSIS — R638 Other symptoms and signs concerning food and fluid intake: Secondary | ICD-10-CM | POA: Insufficient documentation

## 2023-04-24 DIAGNOSIS — K5900 Constipation, unspecified: Secondary | ICD-10-CM | POA: Diagnosis not present

## 2023-04-24 DIAGNOSIS — R1011 Right upper quadrant pain: Secondary | ICD-10-CM | POA: Diagnosis not present

## 2023-04-24 NOTE — Assessment & Plan Note (Signed)
Worsening.  She has increased orexigenic signaling, impaired satiety and inhibitory control. 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  In addition to nutritional and behavioral strategies she benefits from ongoing pharmacotherapy.  Patient is also open to behavioral health counseling if needed.  We are changing Ozempic to [facility].  She will work on creating physical barriers for highly palatable foods.

## 2023-04-24 NOTE — Assessment & Plan Note (Signed)
Patient was seen by endocrinology a repeat 24-hour urine was ordered.  I do not see test results.  We will follow-up on test results.

## 2023-04-24 NOTE — Progress Notes (Signed)
Office: [phone]  /  Fax: [phone]  I connected with  Karen [name] on 04/24/23 by a video enabled telemedicine application and verified that I am speaking with the correct person using two identifiers.  Patient location: Personal vehicle provider location: Office   I discussed the limitations of evaluation and management by telemedicine. The patient expressed understanding and agreed to proceed.    Weight Summary And Biometrics  Vitals BP: 111/60   Anthropometric Measurements Height: 5\' 6"  (1.676 m) Weight: 291 lb (132 kg) BMI (Calculated): 46.99 Weight at Last Visit: 281 lb Weight Lost Since Last Visit: 0 lb Weight Gained Since Last Visit: 10 lb Starting Weight: 339 lb Total Weight Loss (lbs): 48 lb (21.8 kg) Peak Weight: 339 lb   No data recorded  No data recorded Today's Visit #: 20  Starting Date: 02/12/22   Subjective   Chief Complaint: Obesity  Karen [name] is here to discuss her progress with her obesity treatment plan. She is on the the Category 3 Plan and states she is following her eating plan approximately 80 % of the time. She states she is exercising doing aqua aerobics and walking 90 minutes 3 times per week.  Interval History:  Karen [name] was seen today as a virtual visit.  She reports an 11 pound weight gain due to increase in appetite, cravings for sweets and impaired inhibitory [name] following recent course of steroids given to her for the treatment of vertigo.  She acknowledges having highly palatable foods at home and having strong cravings for sweets.  Patient had been doing well from a weight management standpoint until recently.  She also feels that Ozempic effects may be wearing off.  She is currently on Ozempic 2 mg once weekly and is also on metformin and SGLT2 medication.  Regimen is for her diabetes and also weight management.  She was seen by endocrinology for elevated cortisol levels and will be having repeat 24-hour urine  collection.   Karen [name]:  Reports problems with appetite and hunger signals.  Reports problems with satiety and satiation.  Reports problems with eating patterns and portion [name].  Denies abnormal cravings. Denies feeling deprived or restricted.   Barriers identified: strong hunger signals and impaired satiety / inhibitory [name] and medical comorbidities.   Pharmacotherapy for weight loss: She is currently taking Metformin (off label use for incretin effect and / or insulin resistance and / or diabetes prevention) with adequate clinical response  and without side effects. and Ozempic with diabetes as the primary indication with adequate clinical response  and without side effects..   Assessment and Plan   Treatment Plan For Obesity:  Recommended Dietary Goals  Karen [name] is currently in the action stage of change. As such, her goal is to continue weight management plan. She has agreed to: continue current plan  Behavioral Intervention  We discussed the following Behavioral Modification Strategies today: We reviewed nonpharmacological ways to deal with cravings.  She will work on creating barriers between her and highly palatable foods.  We discussed that availability of these foods will increase cravings.  This may also be driving her insulin levels.  She is agreeable to seeing a behavioral health specialist if symptoms do not improve with pharmacotherapy.  Additional resources provided today: None  Recommended Physical Activity Goals  Karen [name] has been advised to work up to 150 minutes of moderate intensity aerobic activity a week and strengthening exercises 2-3 times per week for cardiovascular health, weight loss maintenance and preservation of muscle  mass.   She has agreed to :  continue to gradually increase the amount and intensity of exercise   Pharmacotherapy  We discussed various medication options to help Karen [name] with her weight loss efforts and we both agreed to :   We will switch her Ozempic and start Mounjaro at 5 mg once a week we reviewed medication benefits and side effects.  She was encouraged to watch a video online on how to use pen device since this was a virtual visit.  Associated Conditions Addressed Today  Type 2 diabetes mellitus with other specified complication, without long-term current use of insulin (HCC) Assessment & Plan: Her most recent A1c was 6.9 which is increased from previous value.  I suspect consumption of highly palatable foods and simple carbohydrates.  Her Ozempic may also be wearing off.  After discussion of benefits and side effect she will be switched to Mounjaro 5 mg once a week.  She will continue on metformin and SGLT2 medication.  She will work on reducing simple and added sugars in her diet.  Orders: -     Tirzepatide; Inject 5 mg into the skin once a week.  Dispense: 2 mL; Refill: 0  Class 3 severe obesity with serious comorbidity and body mass index (BMI) of 50.0 to 59.9 in adult, unspecified obesity type [facility]) Assessment & Plan: See obesity treatment plan   Abnormal cortisol level Assessment & Plan: Patient was seen by endocrinology a repeat 24-hour urine was ordered.  I do not see test results.  We will follow-up on test results.   Abnormal food appetite Assessment & Plan: Worsening.  She has increased Karen signaling, impaired satiety and inhibitory [name]. 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  In addition to nutritional and behavioral strategies she benefits from ongoing pharmacotherapy.  Patient is also open to behavioral health counseling if needed.  We are changing Ozempic to [facility].  She will work on creating physical barriers for highly palatable foods.       Objective   Physical Exam:  Blood pressure 111/60, height 5\' 6"  (1.676 m), weight 291 lb (132 kg), last menstrual period  07/16/2016. Body mass index is 46.97 kg/m.  General: She is overweight, cooperative, alert, well developed, and in no acute distress. PSYCH: Has normal mood, affect and thought process.   HEENT: EOMI, sclerae are anicteric. Lungs: Normal breathing effort, no conversational dyspnea. Extremities: No edema.  Neurologic: No gross sensory or motor deficits. No tremors or fasciculations noted.    Diagnostic Data Reviewed:  BMET    Component Value Date/Time   NA 140 02/12/2022 1047   K 4.3 02/12/2022 1047   CL 99 02/12/2022 1047   CO2 25 02/12/2022 1047   GLUCOSE 115 (H) 02/12/2022 1047   GLUCOSE 131 (H) 12/02/2017 1619   BUN 18 02/12/2022 1047   CREATININE 0.72 02/12/2022 1047   CALCIUM 9.8 02/12/2022 1047   GFRNONAA 103 04/13/2019 0000   GFRAA 118 04/13/2019 0000   Lab Results  Component Value Date   HGBA1C 6.0 (H) 05/16/2022   HGBA1C 6.3 (H) 04/13/2019   Lab Results  Component Value Date   INSULIN 12.5 02/12/2022   INSULIN 15.1 12/16/2018   Lab Results  Component Value Date   TSH 0.988 02/12/2022   CBC    Component Value Date/Time   WBC 5.9 02/12/2022 1047   WBC 7.9 12/02/2017 1619   RBC 5.06 02/12/2022 1047   RBC 5.57 (  H) 12/02/2017 1619   HGB 13.0 02/12/2022 1047   HCT 40.8 02/12/2022 1047   PLT 220 02/12/2022 1047   MCV 81 02/12/2022 1047   MCH 25.7 (L) 02/12/2022 1047   MCH 25.0 (L) 12/02/2017 1619   MCHC 31.9 02/12/2022 1047   MCHC 31.5 12/02/2017 1619   RDW 14.1 02/12/2022 1047   Iron Studies No results found for: "IRON", "TIBC", "FERRITIN", "IRONPCTSAT" Lipid Panel     Component Value Date/Time   CHOL 149 02/12/2022 1047   TRIG 119 02/12/2022 1047   HDL 52 02/12/2022 1047   CHOLHDL 2.9 02/12/2022 1047   LDLCALC 76 02/12/2022 1047   Hepatic Function Panel     Component Value Date/Time   PROT 6.9 02/12/2022 1047   ALBUMIN 4.4 02/12/2022 1047   AST 17 02/12/2022 1047   ALT 14 02/12/2022 1047   ALKPHOS 83 02/12/2022 1047   BILITOT 0.3  02/12/2022 1047      Component Value Date/Time   TSH 0.988 02/12/2022 1047   Nutritional Lab Results  Component Value Date   VD25OH 35.6 10/21/2022   VD25OH 28.4 (L) 07/08/2022   VD25OH 16.3 (L) 02/12/2022    Follow-Up   No follow-ups on file.Marland Kitchen She was informed of the importance of frequent follow up visits to maximize her success with intensive lifestyle modifications for her multiple health conditions.  Attestation Statement   Reviewed by clinician on day of visit: allergies, medications, problem list, medical history, surgical history, family history, social history, and previous encounter notes.   I have spent 30 minutes in the care of the patient today including: preparing to see patient (e.g. review and interpretation of tests, old notes ), obtaining and/or reviewing separately obtained history, performing a medically appropriate examination or evaluation, counseling and educating the patient, ordering medications, test or procedures, documenting clinical information in the electronic or other health care record, and independently interpreting results and communicating results to the patient, family, or caregiver   Worthy Rancher, MD

## 2023-04-24 NOTE — Assessment & Plan Note (Signed)
 See obesity treatment plan

## 2023-04-24 NOTE — Assessment & Plan Note (Signed)
Her most recent A1c was 6.9 which is increased from previous value.  I suspect consumption of highly palatable foods and simple carbohydrates.  Her Ozempic may also be wearing off.  After discussion of benefits and side effect she will be switched to Mounjaro 5 mg once a week.  She will continue on metformin and SGLT2 medication.  She will work on reducing simple and added sugars in her diet.

## 2023-04-29 DIAGNOSIS — R42 Dizziness and giddiness: Secondary | ICD-10-CM | POA: Diagnosis not present

## 2023-05-05 DIAGNOSIS — R42 Dizziness and giddiness: Secondary | ICD-10-CM | POA: Diagnosis not present

## 2023-05-06 ENCOUNTER — Other Ambulatory Visit (INDEPENDENT_AMBULATORY_CARE_PROVIDER_SITE_OTHER): Payer: Self-pay | Admitting: Internal Medicine

## 2023-05-06 DIAGNOSIS — E7800 Pure hypercholesterolemia, unspecified: Secondary | ICD-10-CM

## 2023-05-15 ENCOUNTER — Ambulatory Visit (INDEPENDENT_AMBULATORY_CARE_PROVIDER_SITE_OTHER): Payer: BC Managed Care – PPO | Admitting: Internal Medicine

## 2023-05-15 ENCOUNTER — Encounter (INDEPENDENT_AMBULATORY_CARE_PROVIDER_SITE_OTHER): Payer: Self-pay | Admitting: Internal Medicine

## 2023-05-15 VITALS — BP 118/83 | HR 68 | Temp 98.1°F | Ht 66.0 in | Wt 288.0 lb

## 2023-05-15 DIAGNOSIS — E1169 Type 2 diabetes mellitus with other specified complication: Secondary | ICD-10-CM

## 2023-05-15 DIAGNOSIS — Z6841 Body Mass Index (BMI) 40.0 and over, adult: Secondary | ICD-10-CM | POA: Diagnosis not present

## 2023-05-15 DIAGNOSIS — E7800 Pure hypercholesterolemia, unspecified: Secondary | ICD-10-CM

## 2023-05-15 DIAGNOSIS — R638 Other symptoms and signs concerning food and fluid intake: Secondary | ICD-10-CM | POA: Diagnosis not present

## 2023-05-15 DIAGNOSIS — E66813 Obesity, class 3: Secondary | ICD-10-CM

## 2023-05-15 DIAGNOSIS — Z7985 Long-term (current) use of injectable non-insulin antidiabetic drugs: Secondary | ICD-10-CM

## 2023-05-15 MED ORDER — TOPIRAMATE 25 MG PO TABS: 25.0000 mg | ORAL_TABLET | Freq: Every evening | ORAL | 0 refills | Status: DC

## 2023-05-15 MED ORDER — TIRZEPATIDE 10 MG/0.5ML ~~LOC~~ SOAJ: 10.0000 mg | SUBCUTANEOUS | 0 refills | Status: DC

## 2023-05-15 MED ORDER — THERA VITAL M PO TABS: 1.0000 | ORAL_TABLET | Freq: Every day | ORAL | Status: AC

## 2023-05-15 NOTE — Progress Notes (Signed)
 Office: [phone]  /  Fax: [phone]  Weight Summary And Biometrics  Vitals Temp: 98.1 F (36.7 C) BP: 118/83 Pulse Rate: 68 SpO2: 99 %   Anthropometric Measurements Height: 5' 6 (1.676 m) Weight: 288 lb (130.6 kg) BMI (Calculated): 46.51 Weight at Last Visit: 291 lb Weight Lost Since Last Visit: 3 lb Weight Gained Since Last Visit: 0 Starting Weight: 339 lb Total Weight Loss (lbs): 51 lb (23.1 kg) Peak Weight: 339 lb   Body Composition  Body Fat %: 51.3 % Fat Mass (lbs): 148 lbs Muscle Mass (lbs): 133.6 lbs Total Body Water (lbs): 93.4 lbs Visceral Fat Rating : 18    No data recorded Today's Visit #: 21  Starting Date: 02/12/22   Subjective   Chief Complaint: Obesity  Danyal is here to discuss her progress with her obesity treatment plan. She is on the the Category 3 Plan and states she is following her eating plan approximately 40 % of the time. She states she is currently starting back exercising walking in the pool, however over the holidays she was not exercising.  Interval History:   Discussed the use of AI scribe software for clinical note transcription with the patient, who gave verbal consent to proceed.  History of Present Illness   The patient, with a history of insulin  resistance and weight management issues, presents for a follow-up visit after a recent medication change. She reports a weight loss of three pounds since the last visit, attributing this to a return to healthier eating habits and regular swimming. She acknowledges a recent lapse in her diet, particularly over the holiday period, but has since regained control.  The patient has recently started on Mounjaro , a new medication, and has completed one week of treatment without any adverse effects such as nausea. She reports a slight, inconsistent change in her appetite and sense of fullness since starting the medication.  In addition to the weight management issues, the patient  reports thinning hair and brittle nails. She expresses interest in trying a liquid collagen supplement she purchased, in hopes of addressing these issues.   The patient acknowledges the struggle with her eating habits, particularly cravings for sweets. She expresses a strong determination to regain control over her diet and continue her weight loss journey. She is open to trying additional medications to aid in this process, including topiramate .       Orexigenic Control:  Reports improved problems with appetite and hunger signals.  Reports improved problems with satiety and satiation.  Reports problems with eating patterns and portion control.  Reports abnormal cravings. Denies feeling deprived or restricted.   Barriers identified: strong hunger signals and impaired satiety / inhibitory control and medical comorbidities.   Pharmacotherapy for weight loss: She is currently taking Monjauro with diabetes as the primary indication with adequate clinical response  and without side effects..   Assessment and Plan   Treatment Plan For Obesity:  Recommended Dietary Goals  Jannette is currently in the action stage of change. As such, her goal is to continue weight management plan. She has agreed to: continue current plan  Behavioral Intervention  We discussed the following Behavioral Modification Strategies today: continue to work on maintaining a reduced calorie state, getting the recommended amount of protein, incorporating whole foods, making healthy choices, staying well hydrated and practicing mindfulness when eating..  Additional resources provided today: None  Recommended Physical Activity Goals  Salvador has been advised to work up to 150 minutes of moderate intensity aerobic  activity a week and strengthening exercises 2-3 times per week for cardiovascular health, weight loss maintenance and preservation of muscle mass.   She has agreed to :  continue to gradually increase the amount  and intensity of exercise routine  Pharmacotherapy  We discussed various medication options to help Elliott with her weight loss efforts and we both agreed to : increase Mounjaro  to 10 mg once a week  Associated Conditions Addressed and Impacted by Obesity Treatment once a week  Class 3 severe obesity with serious comorbidity and body mass index (BMI) of 50.0 to 59.9 in adult, unspecified obesity type (HCC)  Type 2 diabetes mellitus with other specified complication, without long-term current use of insulin  (HCC) -     Tirzepatide ; Inject 10 mg into the skin once a week.  Dispense: 2 mL; Refill: 0  Abnormal food appetite -     Topiramate ; Take 1 tablet (25 mg total) by mouth at bedtime.  Dispense: 30 tablet; Refill: 0  Other orders -     Thera Vital M; Take 1 tablet by mouth daily.   Assessment and Plan    Obesity with Insulin  Resistance /abnormal food appetite Obesity and insulin  resistance contributing to cravings for sweets and difficulty managing weight. Transitioned from Ozempic  to Mounjaro , starting at 5 mg. Lost 3 pounds despite reduced exercise, indicating positive response. Reports slight changes in appetite and fullness, no significant side effects. Discussed potential increase to 10 mg after the third shot if well-tolerated. Explained that topiramate  can help with appetite control and support weight loss, with potential side effects including numbness and tingling. Emphasized reducing carbohydrate intake and regular exercise, including swimming. - Continue Mounjaro  5 mg, increase to 10 mg after the third shot if well-tolerated - Prescribe topiramate  QHS for appetite control and weight loss until she gets to 10 mg of Mounjaro . - Encourage reduction of carbohydrate intake and sweets - Recommend regular exercise, including swimming - Follow up in four weeks to assess progress and medication tolerance  Leptin Resistance Suspected leptin resistance contributing to strong appetite and  difficulty managing weight. Not routinely tested due to insurance limitations but suspected based on clinical presentation. Explained leptin resistance and its role in appetite and weight management. - Continue current management with Mounjaro  and topiramate  - Educate on the role of leptin resistance in appetite and weight management  Diabetes Mellitus Diabetes exacerbates insulin  resistance and cravings for carbohydrates.  Her hemoglobin A1c was 6.9 and trending upward.  Proper management crucial to control symptoms. Explained the impact of carbohydrate intake on insulin  levels and cravings. - Monitor blood glucose levels regularly -Increase Mounjaro  to 10 mg once a week.  She will continue on metformin and Jardiance . - Continue current diabetes management plan - Educate on the impact of carbohydrate intake on insulin  levels and cravings  General Health Maintenance Reports thinning hair and brittle nails, likely due to weight loss. Not currently taking a multivitamin. Recommended a women's multivitamin with minerals, including biotin and silica. Continue vitamin D  supplementation. Ensure adequate protein intake to support overall health. - Recommend a women's multivitamin with minerals, including biotin and silica - Continue vitamin D  supplementation - Ensure adequate protein intake to support overall health  Follow-up - Schedule follow-up appointment in four weeks - Monitor for any side effects or issues with the new medication regimen.        Objective   Physical Exam:  Blood pressure 118/83, pulse 68, temperature 98.1 F (36.7 C), height 5' 6 (1.676 m), weight  288 lb (130.6 kg), last menstrual period 07/16/2016, SpO2 99%. Body mass index is 46.48 kg/m.  General: She is overweight, cooperative, alert, well developed, and in no acute distress. PSYCH: Has normal mood, affect and thought process.   HEENT: EOMI, sclerae are anicteric. Lungs: Normal breathing effort, no  conversational dyspnea. Extremities: No edema.  Neurologic: No gross sensory or motor deficits. No tremors or fasciculations noted.    Diagnostic Data Reviewed:  BMET    Component Value Date/Time   NA 140 02/12/2022 1047   K 4.3 02/12/2022 1047   CL 99 02/12/2022 1047   CO2 25 02/12/2022 1047   GLUCOSE 115 (H) 02/12/2022 1047   GLUCOSE 131 (H) 12/02/2017 1619   BUN 18 02/12/2022 1047   CREATININE 0.72 02/12/2022 1047   CALCIUM  9.8 02/12/2022 1047   GFRNONAA 103 04/13/2019 0000   GFRAA 118 04/13/2019 0000   Lab Results  Component Value Date   HGBA1C 6.0 (H) 05/16/2022   HGBA1C 6.3 (H) 04/13/2019   Lab Results  Component Value Date   INSULIN  12.5 02/12/2022   INSULIN  15.1 12/16/2018   Lab Results  Component Value Date   TSH 0.988 02/12/2022   CBC    Component Value Date/Time   WBC 5.9 02/12/2022 1047   WBC 7.9 12/02/2017 1619   RBC 5.06 02/12/2022 1047   RBC 5.57 (H) 12/02/2017 1619   HGB 13.0 02/12/2022 1047   HCT 40.8 02/12/2022 1047   PLT 220 02/12/2022 1047   MCV 81 02/12/2022 1047   MCH 25.7 (L) 02/12/2022 1047   MCH 25.0 (L) 12/02/2017 1619   MCHC 31.9 02/12/2022 1047   MCHC 31.5 12/02/2017 1619   RDW 14.1 02/12/2022 1047   Iron Studies No results found for: IRON, TIBC, FERRITIN, IRONPCTSAT Lipid Panel     Component Value Date/Time   CHOL 149 02/12/2022 1047   TRIG 119 02/12/2022 1047   HDL 52 02/12/2022 1047   CHOLHDL 2.9 02/12/2022 1047   LDLCALC 76 02/12/2022 1047   Hepatic Function Panel     Component Value Date/Time   PROT 6.9 02/12/2022 1047   ALBUMIN 4.4 02/12/2022 1047   AST 17 02/12/2022 1047   ALT 14 02/12/2022 1047   ALKPHOS 83 02/12/2022 1047   BILITOT 0.3 02/12/2022 1047      Component Value Date/Time   TSH 0.988 02/12/2022 1047   Nutritional Lab Results  Component Value Date   VD25OH 35.6 10/21/2022   VD25OH 28.4 (L) 07/08/2022   VD25OH 16.3 (L) 02/12/2022    Follow-Up   Return in about 4 weeks (around  06/12/2023) for For Weight Mangement with Dr. Francyne.SABRA She was informed of the importance of frequent follow up visits to maximize her success with intensive lifestyle modifications for her multiple health conditions.  Attestation Statement   Reviewed by clinician on day of visit: allergies, medications, problem list, medical history, surgical history, family history, social history, and previous encounter notes.     Lucas Francyne, MD

## 2023-05-28 ENCOUNTER — Ambulatory Visit: Payer: BC Managed Care – PPO | Attending: Physician Assistant

## 2023-05-28 DIAGNOSIS — R42 Dizziness and giddiness: Secondary | ICD-10-CM | POA: Diagnosis present

## 2023-05-28 DIAGNOSIS — R2681 Unsteadiness on feet: Secondary | ICD-10-CM | POA: Insufficient documentation

## 2023-05-28 NOTE — Therapy (Signed)
 OUTPATIENT PHYSICAL THERAPY VESTIBULAR EVALUATION     Patient Name: Karen [name] MRN: [id number] DOB:[birth year], 58 y.o., female Today's Date: 05/28/2023  END OF SESSION:  PT End of Session - 05/28/23 1310     Visit Number 1    Number of Visits 9    Date for PT Re-Evaluation 06/27/23    Authorization Type [employer]    PT Start Time 1315    PT Stop Time 1354    PT Time Calculation (min) 39 min    Activity Tolerance Patient tolerated treatment well    Behavior During Therapy WFL for tasks assessed/performed             Past Medical History:  Diagnosis Date   Anemia    iron deficiency   Anxiety    Arthritis    general -coccyx area, elbow   Back pain    Complication of anesthesia    Constipation    Diabetes mellitus without complication (HCC)    Diverticulosis    Fatty liver    GERD (gastroesophageal reflux disease)    IBS (irritable bowel syndrome)    Joint pain    Knee pain    Obesity    OCD (obsessive compulsive disorder)    PONV (postoperative nausea and vomiting)    drop in blood pressure(spinal anesthesia)   Sleep apnea    cpap used "automatic"   SOBOE (shortness of breath on exertion)    Past Surgical History:  Procedure Laterality Date   ANAL FISSURE REPAIR     '07   BREAST REDUCTION SURGERY     '88   CESAREAN SECTION     x2   COLONOSCOPY WITH PROPOFOL  N/A 02/23/2015   Procedure: COLONOSCOPY WITH PROPOFOL ;  Surgeon: Tami Falcon, MD;  Location: [facility];  Service: Endoscopy;  Laterality: N/A;   ESOPHAGOGASTRODUODENOSCOPY (EGD) WITH PROPOFOL  N/A 07/03/2017   Procedure: ESOPHAGOGASTRODUODENOSCOPY (EGD) WITH PROPOFOL ;  Surgeon: Tami Falcon, MD;  Location: [facility];  Service: Endoscopy;  Laterality: N/A;   GALLBLADDER SURGERY  08/2017   KNEE ARTHROSCOPY W/ ACL RECONSTRUCTION Right    remains with a ? tear.   REDUCTION MAMMAPLASTY Bilateral 1988   TONSILLECTOMY AND ADENOIDECTOMY     "Adenoids only"   Patient Active Problem List   Diagnosis  Date Noted   Abnormal food appetite 04/24/2023   Acute bilateral low back pain without sciatica 02/13/2023   Pure hypercholesterolemia 09/23/2022   Angiomyolipoma 07/18/2022   Abnormal cortisol level 07/11/2022   Mood disorder (HCC)-unspecified 02/26/2022   Other fatigue 02/25/2022   Diabetes mellitus (HCC) 02/25/2022   OSA on CPAP 02/25/2022   Binge eating disorder 02/25/2022   Class 3 obesity with current BMI of 44 02/25/2022   Vitamin D  deficiency 03/18/2019   Type 2 diabetes mellitus without complication, without long-term current use of insulin  (HCC) 03/18/2019   GERD (gastroesophageal reflux disease) 05/03/2018    PCP: Barbar Bonus, PA-C REFERRING PROVIDER: Denette Finner, PA-C   REFERRING DIAG: [phone] (ICD-10-CM) - Benign paroxysmal vertigo, bilateral   THERAPY DIAG:  Unsteadiness on feet  Dizziness and giddiness  ONSET DATE:   05/13/2023  referral  Rationale for Evaluation and Treatment: Rehabilitation  SUBJECTIVE:   SUBJECTIVE STATEMENT: Patient arrives to clinic alone, no AD. Has had dizziness for ~3 months, both ears. Did complete the Epley maneuver in the audiology office and has been doing it at home. Feels as though things have gotten better, but still in both ears.  Pt accompanied by: self  PERTINENT HISTORY:  anemia, anxiety, arthritis, DM, GERD, OSA  PAIN:  Are you having pain? No  PRECAUTIONS: None   WEIGHT BEARING RESTRICTIONS: No  FALLS: Has patient fallen in last 6 months? No  LIVING ENVIRONMENT: Lives with: lives with their family Lives in: House/apartment Stairs: Yes: External: 2-3 steps; on right going up Has following equipment at home: Grab bars  PLOF: Independent driving  PATIENT GOALS: "to continue to improve"   OBJECTIVE:  Note: Objective measures were completed at Evaluation unless otherwise noted.  COGNITION: Overall cognitive status: Within functional limits for tasks assessed    Cervical ROM:   WFL, denies  pain  STRENGTH: WFL  BED MOBILITY:  Endorses mild dizziness   GAIT: Gait pattern: WFL  PATIENT SURVEYS:  FOTO 52, expected to be at 34  VESTIBULAR ASSESSMENT:  GENERAL OBSERVATION: NAD, no AD   SYMPTOM BEHAVIOR:  Subjective history: see above  Non-Vestibular symptoms: nausea/vomiting  Type of dizziness: Spinning/Vertigo and "like I'm on a boat"  Frequency: a few times daily  Duration: seconds  Aggravating factors: Induced by position change: rolling to the right and rolling to the left, Induced by motion: looking up at the ceiling, bending down to the ground, turning body quickly, and turning head quickly, and Worse in the morning  Relieving factors: slow movements and ride it out  Progression of symptoms: better    POSITIONAL TESTING: Right Dix-Hallpike: upbeating, right nystagmus Left Dix-Hallpike: upbeating, left nystagmus Right Roll Test: no nystagmus Left Roll Test: no nystagmus  MOTION SENSITIVITY:  Motion Sensitivity Quotient Intensity: 0 = none, 1 = Lightheaded, 2 = Mild, 3 = Moderate, 4 = Severe, 5 = Vomiting  Intensity  1. Sitting to supine   2. Supine to L side   3. Supine to R side   4. Supine to sitting   5. L Hallpike-Dix   6. Up from L    7. R Hallpike-Dix   8. Up from R    9. Sitting, head tipped to L knee   10. Head up from L knee   11. Sitting, head tipped to R knee   12. Head up from R knee   13. Sitting head turns x5   14.Sitting head nods x5   15. In stance, 180 turn to L    16. In stance, 180 turn to R                                                                                                                               TREATMENT   Canalith Repositioning:  Epley Right: Number of Reps: 1 and Response to Treatment: symptoms improved and Epley Left: Number of Reps: 1 and Response to Treatment: symptoms improved   PATIENT EDUCATION: Education details: PT POC, exam findings, pathophys of BPPV, initial HEP Person educated:  Patient Education method: Explanation, Demonstration, and Handouts Education comprehension: verbalized understanding  HOME EXERCISE PROGRAM:   Sit on edge of bed. 1. Turn head  45 to right. 2. Maintain head position and lie down slowly on left side. Hold until symptoms subside. 3. Sit up slowly. Hold until symptoms subside. 4. Turn head 45 to left. 5. Maintain head position and lie down slowly on right side. Hold until symptoms subside. 6. Sit up slowly. Repeat sequence __3__ times per session. Do __3__ sessions per day. GOALS: Goals reviewed with patient? Yes  SHORT TERM GOALS: = LTG based on PT POC length   LONG TERM GOALS: Target date: 06/27/23  Pt will be independent with final HEP for improved symptom report  Baseline: to be updated PRN  Goal status: INITIAL  2.  Patient will demonstrate (-) positional testing to indicate resolution of BPPV  Baseline: (+) B posterior canal canalithiasis  Goal status: INITIAL    ASSESSMENT:  CLINICAL IMPRESSION: Patient is a 58 y.o. female who was seen today for physical therapy evaluation and treatment for dizziness. She presents with bilaterally positive posterior canal canalithiasis, L >R. Treated with Epley x1 each direction and tolerated well. Provided patient with Kendall Pauls exercises to promote canalith repositioning. She would benefit from skilled PT services to address the above mentioned deficits.    OBJECTIVE IMPAIRMENTS: dizziness.   ACTIVITY LIMITATIONS: bending, bed mobility, hygiene/grooming, locomotion level, and caring for others  PARTICIPATION LIMITATIONS: interpersonal relationship, driving, shopping, and community activity  PERSONAL FACTORS: Age, Sex, and Time since onset of injury/illness/exacerbation are also affecting patient's functional outcome.   REHAB POTENTIAL: Good  CLINICAL DECISION MAKING: Stable/uncomplicated  EVALUATION COMPLEXITY: Low   PLAN:  PT FREQUENCY: 2x/week  PT DURATION: 4  weeks  PLANNED INTERVENTIONS: 97164- PT Re-evaluation, 97110-Therapeutic exercises, 97530- Therapeutic activity, 97112- Neuromuscular re-education, 97535- Self Care, 41660- Manual therapy, [phone]- Gait training, [phone]- Canalith repositioning, Patient/Family education, Balance training, Stair training, Dry Needling, Vestibular training, Visual/preceptual remediation/compensation, and DME instructions  PLAN FOR NEXT SESSION: reassess B posterior canal BPPV and tx prn, assess remainder of vestibular system PRN    Rebecca Campus, PT Rebecca Campus, PT, DPT, CBIS  05/28/2023, 2:53 PM

## 2023-05-30 ENCOUNTER — Encounter: Payer: Self-pay | Admitting: Physical Therapy

## 2023-05-30 ENCOUNTER — Ambulatory Visit: Payer: BC Managed Care – PPO | Admitting: Physical Therapy

## 2023-05-30 DIAGNOSIS — R42 Dizziness and giddiness: Secondary | ICD-10-CM

## 2023-05-30 DIAGNOSIS — R2681 Unsteadiness on feet: Secondary | ICD-10-CM

## 2023-05-30 NOTE — Therapy (Signed)
OUTPATIENT PHYSICAL THERAPY VESTIBULAR TREATMENT     Patient Name: Karen [name] MRN: [id number] DOB:[birth year], 58 y.o., female Today's Date: 05/30/2023  END OF SESSION:  PT End of Session - 05/30/23 0849     Visit Number 2    Number of Visits 9    Date for PT Re-Evaluation 06/27/23    Authorization Type BCBS    PT Start Time 0847    PT Stop Time 0923    PT Time Calculation (min) 36 min    Activity Tolerance Patient tolerated treatment well    Behavior During Therapy WFL for tasks assessed/performed             Past Medical History:  Diagnosis Date   Anemia    iron deficiency   Anxiety    Arthritis    general -coccyx area, elbow   Back pain    Complication of anesthesia    Constipation    Diabetes mellitus without complication (HCC)    Diverticulosis    Fatty liver    GERD (gastroesophageal reflux disease)    IBS (irritable bowel syndrome)    Joint pain    Knee pain    Obesity    OCD (obsessive compulsive disorder)    PONV (postoperative nausea and vomiting)    drop in blood pressure(spinal anesthesia)   Sleep apnea    cpap used "automatic"   SOBOE (shortness of breath on exertion)    Past Surgical History:  Procedure Laterality Date   ANAL FISSURE REPAIR     '07   BREAST REDUCTION SURGERY     '88   CESAREAN SECTION     x2   COLONOSCOPY WITH PROPOFOL N/A 02/23/2015   Procedure: COLONOSCOPY WITH PROPOFOL;  Surgeon: Charna Elizabeth, MD;  Location: [facility];  Service: Endoscopy;  Laterality: N/A;   ESOPHAGOGASTRODUODENOSCOPY (EGD) WITH PROPOFOL N/A 07/03/2017   Procedure: ESOPHAGOGASTRODUODENOSCOPY (EGD) WITH PROPOFOL;  Surgeon: Charna Elizabeth, MD;  Location: [facility];  Service: Endoscopy;  Laterality: N/A;   GALLBLADDER SURGERY  08/2017   KNEE ARTHROSCOPY W/ ACL RECONSTRUCTION Right    remains with a ? tear.   REDUCTION MAMMAPLASTY Bilateral 1988   TONSILLECTOMY AND ADENOIDECTOMY     "Adenoids only"   Patient Active Problem List   Diagnosis  Date Noted   Abnormal food appetite 04/24/2023   Acute bilateral low back pain without sciatica 02/13/2023   Pure hypercholesterolemia 09/23/2022   Angiomyolipoma 07/18/2022   Abnormal cortisol level 07/11/2022   Mood disorder (HCC)-unspecified 02/26/2022   Other fatigue 02/25/2022   Diabetes mellitus (HCC) 02/25/2022   OSA on CPAP 02/25/2022   Binge eating disorder 02/25/2022   Class 3 obesity with current BMI of 44 02/25/2022   Vitamin D deficiency 03/18/2019   Type 2 diabetes mellitus without complication, without long-term current use of insulin (HCC) 03/18/2019   GERD (gastroesophageal reflux disease) 05/03/2018    PCP: Chyrl Civatte, PA-C REFERRING PROVIDER: Aquilla Hacker, PA-C   REFERRING DIAG: [phone] (ICD-10-CM) - Benign paroxysmal vertigo, bilateral   THERAPY DIAG:  Unsteadiness on feet  Dizziness and giddiness  ONSET DATE:   05/13/2023  referral  Rationale for Evaluation and Treatment: Rehabilitation  SUBJECTIVE:   SUBJECTIVE STATEMENT: Feeling the same as the other day. But overall, so much better than she was a few months. Has been trying her Austin Miles exercises at home. Notes the L is worse.   Pt accompanied by: self  PERTINENT HISTORY: anemia, anxiety, arthritis, DM, GERD, OSA  PAIN:  Are  you having pain? No  PRECAUTIONS: None   WEIGHT BEARING RESTRICTIONS: No  FALLS: Has patient fallen in last 6 months? No  LIVING ENVIRONMENT: Lives with: lives with their family Lives in: House/apartment Stairs: Yes: External: 2-3 steps; on right going up Has following equipment at home: Grab bars  PLOF: Independent driving  PATIENT GOALS: "to continue to improve"   OBJECTIVE:  Note: Objective measures were completed at Evaluation unless otherwise noted.  COGNITION: Overall cognitive status: Within functional limits for tasks assessed    Cervical ROM:   WFL, denies pain  STRENGTH: WFL  BED MOBILITY:  Endorses mild  dizziness   GAIT: Gait pattern: WFL  PATIENT SURVEYS:  FOTO 52, expected to be at 35  VESTIBULAR ASSESSMENT:  GENERAL OBSERVATION: NAD, no AD   SYMPTOM BEHAVIOR:  Subjective history: see above  Non-Vestibular symptoms: nausea/vomiting  Type of dizziness: Spinning/Vertigo and "like I'm on a boat"  Frequency: a few times daily  Duration: seconds  Aggravating factors: Induced by position change: rolling to the right and rolling to the left, Induced by motion: looking up at the ceiling, bending down to the ground, turning body quickly, and turning head quickly, and Worse in the morning  Relieving factors: slow movements and ride it out  Progression of symptoms: better   TREATMENT:    POSITIONAL TESTING: Right Dix-Hallpike: upbeating, right nystagmus and lasting approx. 40-45 seconds  Left Dix-Hallpike: no nystagmus and pt only having dizziness when coming upright                                                                                                                            Canalith Repositioning:  Epley Right: Number of Reps: 4, Response to Treatment: comment: symptoms improved, and Comment: After 1st rep, R DixHallpike position pt with much more mild nystagmus lasting approx. 20-25 seconds with pt not feeling as dizzy. After 2nd rep in R DixHallpike, pt with a 10 second latency period with very mild nystagmus lasting approx 15 seconds. After 3rd rep in R DixHallpike, pt with a latency period and more mild nystagmus lasting about 10 seconds. Pt with improved symptoms each time with return to upright     PATIENT EDUCATION: Education details: BPPV education/reoccurrence rates, continue [employer] as able at home.  Person educated: Patient Education method: Explanation Education comprehension: verbalized understanding  HOME EXERCISE PROGRAM:   Sit on edge of bed. 1. Turn head 45 to right. 2. Maintain head position and lie down slowly on left side. Hold until  symptoms subside. 3. Sit up slowly. Hold until symptoms subside. 4. Turn head 45 to left. 5. Maintain head position and lie down slowly on right side. Hold until symptoms subside. 6. Sit up slowly. Repeat sequence __3__ times per session. Do __3__ sessions per day. GOALS: Goals reviewed with patient? Yes  SHORT TERM GOALS: = LTG based on PT POC length   LONG TERM GOALS: Target date: 06/27/23  Pt will be  independent with final HEP for improved symptom report  Baseline: to be updated PRN  Goal status: INITIAL  2.  Patient will demonstrate (-) positional testing to indicate resolution of BPPV  Baseline: (+) B posterior canal canalithiasis  Goal status: INITIAL    ASSESSMENT:  CLINICAL IMPRESSION: Re-assessed positional testing today with pt demonstrating no nystagmus in L Dix-Hallpike position, indicating resolution of L posterior canal BPPV. Pt only with dizziness with return to upright. Pt with R posterior canalithiasis and treated with 4 reps of the Epley maneuver with more mild nystagmus, less duration, and less symptoms with each rep. Encouraged pt to continue to perform Austin Miles exercises at home as able. Will continue per POC.    OBJECTIVE IMPAIRMENTS: dizziness.   ACTIVITY LIMITATIONS: bending, bed mobility, hygiene/grooming, locomotion level, and caring for others  PARTICIPATION LIMITATIONS: interpersonal relationship, driving, shopping, and community activity  PERSONAL FACTORS: Age, Sex, and Time since onset of injury/illness/exacerbation are also affecting patient's functional outcome.   REHAB POTENTIAL: Good  CLINICAL DECISION MAKING: Stable/uncomplicated  EVALUATION COMPLEXITY: Low   PLAN:  PT FREQUENCY: 2x/week  PT DURATION: 4 weeks  PLANNED INTERVENTIONS: 97164- PT Re-evaluation, 97110-Therapeutic exercises, 97530- Therapeutic activity, 97112- Neuromuscular re-education, 97535- Self Care, 30160- Manual therapy, [phone]- Gait training, [phone]- Canalith  repositioning, Patient/Family education, Balance training, Stair training, Dry Needling, Vestibular training, Visual/preceptual remediation/compensation, and DME instructions  PLAN FOR NEXT SESSION: check BPPV as needed, perform further vestibular assessment as needed.    Sherlie Ban, PT, DPT 05/30/23 9:25 AM

## 2023-06-03 ENCOUNTER — Ambulatory Visit: Payer: BC Managed Care – PPO | Admitting: Physical Therapy

## 2023-06-03 ENCOUNTER — Encounter: Payer: Self-pay | Admitting: Physical Therapy

## 2023-06-03 VITALS — BP 111/72 | HR 83

## 2023-06-03 DIAGNOSIS — R42 Dizziness and giddiness: Secondary | ICD-10-CM

## 2023-06-03 DIAGNOSIS — R2681 Unsteadiness on feet: Secondary | ICD-10-CM

## 2023-06-03 NOTE — Therapy (Signed)
OUTPATIENT PHYSICAL THERAPY VESTIBULAR TREATMENT     Patient Name: Karen [name] MRN: [id number] DOB:January 28, 1966, 58 y.o., female Today's Date: 06/03/2023  END OF SESSION:  PT End of Session - 06/03/23 0933     Visit Number 3    Number of Visits 9    Date for PT Re-Evaluation 06/27/23    Authorization Type BCBS    PT Start Time 0930    PT Stop Time 1010    PT Time Calculation (min) 40 min    Equipment Utilized During Treatment Other (comment)   mat level gait belt not indicated   Activity Tolerance Patient tolerated treatment well    Behavior During Therapy WFL for tasks assessed/performed             Past Medical History:  Diagnosis Date   Anemia    iron deficiency   Anxiety    Arthritis    general -coccyx area, elbow   Back pain    Complication of anesthesia    Constipation    Diabetes mellitus without complication (HCC)    Diverticulosis    Fatty liver    GERD (gastroesophageal reflux disease)    IBS (irritable bowel syndrome)    Joint pain    Knee pain    Obesity    OCD (obsessive compulsive disorder)    PONV (postoperative nausea and vomiting)    drop in blood pressure(spinal anesthesia)   Sleep apnea    cpap used "automatic"   SOBOE (shortness of breath on exertion)    Past Surgical History:  Procedure Laterality Date   ANAL FISSURE REPAIR     '07   BREAST REDUCTION SURGERY     '88   CESAREAN SECTION     x2   COLONOSCOPY WITH PROPOFOL N/A 02/23/2015   Procedure: COLONOSCOPY WITH PROPOFOL;  Surgeon: Charna Elizabeth, MD;  Location: [facility];  Service: Endoscopy;  Laterality: N/A;   ESOPHAGOGASTRODUODENOSCOPY (EGD) WITH PROPOFOL N/A 07/03/2017   Procedure: ESOPHAGOGASTRODUODENOSCOPY (EGD) WITH PROPOFOL;  Surgeon: Charna Elizabeth, MD;  Location: [facility];  Service: Endoscopy;  Laterality: N/A;   GALLBLADDER SURGERY  08/2017   KNEE ARTHROSCOPY W/ ACL RECONSTRUCTION Right    remains with a ? tear.   REDUCTION MAMMAPLASTY Bilateral 1988    TONSILLECTOMY AND ADENOIDECTOMY     "Adenoids only"   Patient Active Problem List   Diagnosis Date Noted   Abnormal food appetite 04/24/2023   Acute bilateral low back pain without sciatica 02/13/2023   Pure hypercholesterolemia 09/23/2022   Angiomyolipoma 07/18/2022   Abnormal cortisol level 07/11/2022   Mood disorder (HCC)-unspecified 02/26/2022   Other fatigue 02/25/2022   Diabetes mellitus (HCC) 02/25/2022   OSA on CPAP 02/25/2022   Binge eating disorder 02/25/2022   Class 3 obesity with current BMI of 44 02/25/2022   Vitamin D deficiency 03/18/2019   Type 2 diabetes mellitus without complication, without long-term current use of insulin (HCC) 03/18/2019   GERD (gastroesophageal reflux disease) 05/03/2018    PCP: Chyrl Civatte, PA-C REFERRING PROVIDER: Aquilla Hacker, PA-C   REFERRING DIAG: [phone] (ICD-10-CM) - Benign paroxysmal vertigo, bilateral   THERAPY DIAG:  Unsteadiness on feet  Dizziness and giddiness  ONSET DATE:   05/13/2023  referral  Rationale for Evaluation and Treatment: Rehabilitation  SUBJECTIVE:   SUBJECTIVE STATEMENT: Patient reports continues to feel better. She reports that she still has noticed some occasionally when going down to the left or rolling to the right but is much improved. Denies falls and near falls.  Pt accompanied by: self  PERTINENT HISTORY: anemia, anxiety, arthritis, DM, GERD, OSA  PAIN:  Are you having pain? No  PRECAUTIONS: None   WEIGHT BEARING RESTRICTIONS: No  FALLS: Has patient fallen in last 6 months? No  LIVING ENVIRONMENT: Lives with: lives with their family Lives in: House/apartment Stairs: Yes: External: 2-3 steps; on right going up Has following equipment at home: Grab bars  PLOF: Independent driving  PATIENT GOALS: "to continue to improve"   OBJECTIVE:  Note: Objective measures were completed at Evaluation unless otherwise noted.  COGNITION: Overall cognitive status: Within functional  limits for tasks assessed    Cervical ROM:   WFL, denies pain  STRENGTH: WFL  BED MOBILITY:  Endorses mild dizziness   GAIT: Gait pattern: WFL  PATIENT SURVEYS:  FOTO 52, expected to be at 39  VESTIBULAR ASSESSMENT:  GENERAL OBSERVATION: NAD, no AD   SYMPTOM BEHAVIOR:  Subjective history: see above  Non-Vestibular symptoms: nausea/vomiting  Type of dizziness: Spinning/Vertigo and "like I'm on a boat"  Frequency: a few times daily  Duration: seconds  Aggravating factors: Induced by position change: rolling to the right and rolling to the left, Induced by motion: looking up at the ceiling, bending down to the ground, turning body quickly, and turning head quickly, and Worse in the morning  Relieving factors: slow movements and ride it out  Progression of symptoms: better  TREATMENT:   Vitals:   06/03/23 0936  BP: 111/72  Pulse: 83  Seated on LUE at rest  Canalith repositioning  POSITIONAL TESTING: Right Dix-Hallpike: upbeating, right nystagmus and lasting approx. Larger amplitude on initial attempt and decreased after each following attempt, 30-35 seconds  Left Dix-Hallpike: no nystagmus and pt only having dizziness when coming upright                                                                                                                            Canalith Repositioning: Epley Right: Number of Reps: 3, Response to Treatment: comment: symptoms improved, and Comment: after each consecutive rep, nystagmus became progressively more mild with final duration ~10 seconds, when re-evaluated on 3rd maneuver, mild torsional nystagmus but very small amplitude similar to end range nystagmus noted when patient assessed for occular ROM sitting up, added vibration, held further treatment to re-assess next session   NMR:  Reviewed etiology of BPPV and cause of delay in symptoms, discussed POC moving forward and recommending letting symptoms settle for evening before returning  to HEP, discussed residual motion sensitivity vs BPPV symptoms as patient asking about if she will always be dizzy bring head down and how both are addressed to treat/resolve    PATIENT EDUCATION: Education details: See above Person educated: Patient Education method: Explanation Education comprehension: verbalized understanding  HOME EXERCISE PROGRAM:   Sit on edge of bed. 1. Turn head 45 to right. 2. Maintain head position and lie down slowly on left side. Hold until symptoms subside. 3. Sit up slowly.  Hold until symptoms subside. 4. Turn head 45 to left. 5. Maintain head position and lie down slowly on right side. Hold until symptoms subside. 6. Sit up slowly. Repeat sequence __3__ times per session. Do __3__ sessions per day. GOALS: Goals reviewed with patient? Yes  SHORT TERM GOALS: = LTG based on PT POC length   LONG TERM GOALS: Target date: 06/27/23  Pt will be independent with final HEP for improved symptom report  Baseline: to be updated PRN  Goal status: INITIAL  2.  Patient will demonstrate (-) positional testing to indicate resolution of BPPV  Baseline: (+) B posterior canal canalithiasis  Goal status: INITIAL    ASSESSMENT:  CLINICAL IMPRESSION: Patient with no nystagmus noted again in L [employer] today and due to ongoing nystagmus and symptoms in R [employer] treated 3x with Epley maneuver adding vibration for final rep to treat R posterior canalithiasis. Symptoms improve after each consecutive rep. Will continue per POC.    OBJECTIVE IMPAIRMENTS: dizziness.   ACTIVITY LIMITATIONS: bending, bed mobility, hygiene/grooming, locomotion level, and caring for others  PARTICIPATION LIMITATIONS: interpersonal relationship, driving, shopping, and community activity  PERSONAL FACTORS: Age, Sex, and Time since onset of injury/illness/exacerbation are also affecting patient's functional outcome.   REHAB POTENTIAL: Good  CLINICAL DECISION MAKING:  Stable/uncomplicated  EVALUATION COMPLEXITY: Low   PLAN:  PT FREQUENCY: 2x/week  PT DURATION: 4 weeks  PLANNED INTERVENTIONS: 97164- PT Re-evaluation, 97110-Therapeutic exercises, 97530- Therapeutic activity, 97112- Neuromuscular re-education, 97535- Self Care, 78469- Manual therapy, [phone]- Gait training, [phone]- Canalith repositioning, Patient/Family education, Balance training, Stair training, Dry Needling, Vestibular training, Visual/preceptual remediation/compensation, and DME instructions  PLAN FOR NEXT SESSION: check BPPV as needed, perform further vestibular assessment as needed.   Maryruth Eve, PT, DPT 06/03/23 10:30 AM

## 2023-06-05 ENCOUNTER — Ambulatory Visit: Payer: BC Managed Care – PPO | Admitting: Physical Therapy

## 2023-06-07 ENCOUNTER — Other Ambulatory Visit (INDEPENDENT_AMBULATORY_CARE_PROVIDER_SITE_OTHER): Payer: Self-pay | Admitting: Internal Medicine

## 2023-06-07 DIAGNOSIS — R638 Other symptoms and signs concerning food and fluid intake: Secondary | ICD-10-CM

## 2023-06-09 ENCOUNTER — Ambulatory Visit: Payer: BC Managed Care – PPO | Admitting: Physical Therapy

## 2023-06-09 ENCOUNTER — Encounter: Payer: Self-pay | Admitting: Physical Therapy

## 2023-06-09 VITALS — BP 101/69 | HR 81

## 2023-06-09 DIAGNOSIS — R42 Dizziness and giddiness: Secondary | ICD-10-CM

## 2023-06-09 DIAGNOSIS — R2681 Unsteadiness on feet: Secondary | ICD-10-CM | POA: Diagnosis not present

## 2023-06-09 NOTE — Therapy (Signed)
OUTPATIENT PHYSICAL THERAPY VESTIBULAR TREATMENT     Patient Name: Karen [name] MRN: [id number] DOB:November 24, 1965, 58 y.o., female Today's Date: 06/09/2023  END OF SESSION:  PT End of Session - 06/09/23 0934     Visit Number 4    Number of Visits 9    Date for PT Re-Evaluation 06/27/23    Authorization Type BCBS    PT Start Time 0934    PT Stop Time 0950    PT Time Calculation (min) 16 min    Activity Tolerance Patient tolerated treatment well    Behavior During Therapy WFL for tasks assessed/performed             Past Medical History:  Diagnosis Date   Anemia    iron deficiency   Anxiety    Arthritis    general -coccyx area, elbow   Back pain    Complication of anesthesia    Constipation    Diabetes mellitus without complication (HCC)    Diverticulosis    Fatty liver    GERD (gastroesophageal reflux disease)    IBS (irritable bowel syndrome)    Joint pain    Knee pain    Obesity    OCD (obsessive compulsive disorder)    PONV (postoperative nausea and vomiting)    drop in blood pressure(spinal anesthesia)   Sleep apnea    cpap used "automatic"   SOBOE (shortness of breath on exertion)    Past Surgical History:  Procedure Laterality Date   ANAL FISSURE REPAIR     '07   BREAST REDUCTION SURGERY     '88   CESAREAN SECTION     x2   COLONOSCOPY WITH PROPOFOL N/A 02/23/2015   Procedure: COLONOSCOPY WITH PROPOFOL;  Surgeon: Charna Elizabeth, MD;  Location: [facility];  Service: Endoscopy;  Laterality: N/A;   ESOPHAGOGASTRODUODENOSCOPY (EGD) WITH PROPOFOL N/A 07/03/2017   Procedure: ESOPHAGOGASTRODUODENOSCOPY (EGD) WITH PROPOFOL;  Surgeon: Charna Elizabeth, MD;  Location: [facility];  Service: Endoscopy;  Laterality: N/A;   GALLBLADDER SURGERY  08/2017   KNEE ARTHROSCOPY W/ ACL RECONSTRUCTION Right    remains with a ? tear.   REDUCTION MAMMAPLASTY Bilateral 1988   TONSILLECTOMY AND ADENOIDECTOMY     "Adenoids only"   Patient Active Problem List   Diagnosis  Date Noted   Abnormal food appetite 04/24/2023   Acute bilateral low back pain without sciatica 02/13/2023   Pure hypercholesterolemia 09/23/2022   Angiomyolipoma 07/18/2022   Abnormal cortisol level 07/11/2022   Mood disorder (HCC)-unspecified 02/26/2022   Other fatigue 02/25/2022   Diabetes mellitus (HCC) 02/25/2022   OSA on CPAP 02/25/2022   Binge eating disorder 02/25/2022   Class 3 obesity with current BMI of 44 02/25/2022   Vitamin D deficiency 03/18/2019   Type 2 diabetes mellitus without complication, without long-term current use of insulin (HCC) 03/18/2019   GERD (gastroesophageal reflux disease) 05/03/2018    PCP: Chyrl Civatte, PA-C REFERRING PROVIDER: Aquilla Hacker, PA-C   REFERRING DIAG: [phone] (ICD-10-CM) - Benign paroxysmal vertigo, bilateral   THERAPY DIAG:  Unsteadiness on feet  Dizziness and giddiness  ONSET DATE:   05/13/2023  referral  Rationale for Evaluation and Treatment: Rehabilitation  SUBJECTIVE:   SUBJECTIVE STATEMENT: Patient reports only one instance where she looked down and looked up really quick and she felt a little dizzy. She states that it was so mild. She reports no major dizziness with exercises. She denies falls and near falls.   Pt accompanied by: self  PERTINENT HISTORY: anemia, anxiety,  arthritis, DM, GERD, OSA  PAIN:  Are you having pain? No  PRECAUTIONS: None   WEIGHT BEARING RESTRICTIONS: No  FALLS: Has patient fallen in last 6 months? No  LIVING ENVIRONMENT: Lives with: lives with their family Lives in: House/apartment Stairs: Yes: External: 2-3 steps; on right going up Has following equipment at home: Grab bars  PLOF: Independent driving  PATIENT GOALS: "to continue to improve"   OBJECTIVE:  Note: Objective measures were completed at Evaluation unless otherwise noted.  COGNITION: Overall cognitive status: Within functional limits for tasks assessed    Cervical ROM:   WFL, denies  pain  STRENGTH: WFL  BED MOBILITY:  Endorses mild dizziness  GAIT: Gait pattern: WFL  PATIENT SURVEYS:  FOTO 52, expected to be at 2  VESTIBULAR ASSESSMENT:  GENERAL OBSERVATION: NAD, no AD   SYMPTOM BEHAVIOR:  Subjective history: see above  Non-Vestibular symptoms: nausea/vomiting  Type of dizziness: Spinning/Vertigo and "like I'm on a boat"  Frequency: a few times daily  Duration: seconds  Aggravating factors: Induced by position change: rolling to the right and rolling to the left, Induced by motion: looking up at the ceiling, bending down to the ground, turning body quickly, and turning head quickly, and Worse in the morning  Relieving factors: slow movements and ride it out  Progression of symptoms: better  TREATMENT:   Vitals:   06/09/23 0937  BP: 101/69  Pulse: 81  Seated on RUE  Canalith repositioning  POSITIONAL TESTING:  Right Dix-Hallpike: upbeating, right nystagmus and lasting approx 30 seconds. Upon retesting no nystgamus noted                                                                                                                          Canalith Repositioning: Epley Right: Number of Reps: 1, Response to Treatment: comment: symptoms improved, and Comment: reported moderate dizziness in position one and upon sitting back up, see above for symptom duration, tested 2 more times with no nystagmus or reports of dizziness noted, held rest of session to allow things to settle and will reassess next session    PATIENT EDUCATION: Education details: See above Person educated: Patient Education method: Explanation Education comprehension: verbalized understanding  HOME EXERCISE PROGRAM:   Sit on edge of bed. 1. Turn head 45 to right. 2. Maintain head position and lie down slowly on left side. Hold until symptoms subside. 3. Sit up slowly. Hold until symptoms subside. 4. Turn head 45 to left. 5. Maintain head position and lie down slowly on right side.  Hold until symptoms subside. 6. Sit up slowly. Repeat sequence __3__ times per session. Do __3__ sessions per day. GOALS: Goals reviewed with patient? Yes  SHORT TERM GOALS: = LTG based on PT POC length   LONG TERM GOALS: Target date: 06/27/23  Pt will be independent with final HEP for improved symptom report  Baseline: to be updated PRN  Goal status: INITIAL  2.  Patient will demonstrate (-) positional  testing to indicate resolution of BPPV  Baseline: (+) B posterior canal canalithiasis  Goal status: INITIAL  ASSESSMENT:  CLINICAL IMPRESSION: Patient positive in R Dix Hallpike at start of session, treated 1x with Epley maneuver and symptoms appeared to fully resolved with repositional testing on that side checked again twice. Held further treatment for the session but recommended patient come back one addiontal time to fully determine if clear or call and cancel if symptoms were fully resolved. Patient vebalized understanding.    OBJECTIVE IMPAIRMENTS: dizziness.   ACTIVITY LIMITATIONS: bending, bed mobility, hygiene/grooming, locomotion level, and caring for others  PARTICIPATION LIMITATIONS: interpersonal relationship, driving, shopping, and community activity  PERSONAL FACTORS: Age, Sex, and Time since onset of injury/illness/exacerbation are also affecting patient's functional outcome.   REHAB POTENTIAL: Good  CLINICAL DECISION MAKING: Stable/uncomplicated  EVALUATION COMPLEXITY: Low   PLAN:  PT FREQUENCY: 2x/week  PT DURATION: 4 weeks  PLANNED INTERVENTIONS: 97164- PT Re-evaluation, 97110-Therapeutic exercises, 97530- Therapeutic activity, 97112- Neuromuscular re-education, 97535- Self Care, 40981- Manual therapy, [phone]- Gait training, [phone]- Canalith repositioning, Patient/Family education, Balance training, Stair training, Dry Needling, Vestibular training, Visual/preceptual remediation/compensation, and DME instructions  PLAN FOR NEXT SESSION: check BPPV as  needed, recommend D/C if all canals still clear (R positive last time but clear at end of session and L had been clear the last 2 sessions before that)   Maryruth Eve, PT, DPT 06/09/23 10:15 AM

## 2023-06-11 ENCOUNTER — Ambulatory Visit: Payer: BC Managed Care – PPO

## 2023-06-11 ENCOUNTER — Other Ambulatory Visit (INDEPENDENT_AMBULATORY_CARE_PROVIDER_SITE_OTHER): Payer: Self-pay | Admitting: Internal Medicine

## 2023-06-11 DIAGNOSIS — R2681 Unsteadiness on feet: Secondary | ICD-10-CM

## 2023-06-11 DIAGNOSIS — E7800 Pure hypercholesterolemia, unspecified: Secondary | ICD-10-CM

## 2023-06-11 DIAGNOSIS — R42 Dizziness and giddiness: Secondary | ICD-10-CM

## 2023-06-11 NOTE — Therapy (Signed)
OUTPATIENT PHYSICAL THERAPY VESTIBULAR TREATMENT     Patient Name: Karen [name] MRN: [id number] DOB:1965/09/19, 58 y.o., female Today's Date: 06/11/2023  END OF SESSION:  PT End of Session - 06/11/23 1102     Visit Number 5    Number of Visits 9    Date for PT Re-Evaluation 06/27/23    Authorization Type [employer]    PT Start Time 1101    PT Stop Time 1130   BPPV tx   PT Time Calculation (min) 29 min    Activity Tolerance Patient tolerated treatment well    Behavior During Therapy WFL for tasks assessed/performed             Past Medical History:  Diagnosis Date   Anemia    iron deficiency   Anxiety    Arthritis    general -coccyx area, elbow   Back pain    Complication of anesthesia    Constipation    Diabetes mellitus without complication (HCC)    Diverticulosis    Fatty liver    GERD (gastroesophageal reflux disease)    IBS (irritable bowel syndrome)    Joint pain    Knee pain    Obesity    OCD (obsessive compulsive disorder)    PONV (postoperative nausea and vomiting)    drop in blood pressure(spinal anesthesia)   Sleep apnea    cpap used "automatic"   SOBOE (shortness of breath on exertion)    Past Surgical History:  Procedure Laterality Date   ANAL FISSURE REPAIR     '07   BREAST REDUCTION SURGERY     '88   CESAREAN SECTION     x2   COLONOSCOPY WITH PROPOFOL N/A 02/23/2015   Procedure: COLONOSCOPY WITH PROPOFOL;  Surgeon: Charna Elizabeth, MD;  Location: [facility];  Service: Endoscopy;  Laterality: N/A;   ESOPHAGOGASTRODUODENOSCOPY (EGD) WITH PROPOFOL N/A 07/03/2017   Procedure: ESOPHAGOGASTRODUODENOSCOPY (EGD) WITH PROPOFOL;  Surgeon: Charna Elizabeth, MD;  Location: [facility];  Service: Endoscopy;  Laterality: N/A;   GALLBLADDER SURGERY  08/2017   KNEE ARTHROSCOPY W/ ACL RECONSTRUCTION Right    remains with a ? tear.   REDUCTION MAMMAPLASTY Bilateral 1988   TONSILLECTOMY AND ADENOIDECTOMY     "Adenoids only"   Patient Active Problem List    Diagnosis Date Noted   Abnormal food appetite 04/24/2023   Acute bilateral low back pain without sciatica 02/13/2023   Pure hypercholesterolemia 09/23/2022   Angiomyolipoma 07/18/2022   Abnormal cortisol level 07/11/2022   Mood disorder (HCC)-unspecified 02/26/2022   Other fatigue 02/25/2022   Diabetes mellitus (HCC) 02/25/2022   OSA on CPAP 02/25/2022   Binge eating disorder 02/25/2022   Class 3 obesity with current BMI of 44 02/25/2022   Vitamin D deficiency 03/18/2019   Type 2 diabetes mellitus without complication, without long-term current use of insulin (HCC) 03/18/2019   GERD (gastroesophageal reflux disease) 05/03/2018    PCP: Chyrl Civatte, PA-C REFERRING PROVIDER: Aquilla Hacker, PA-C   REFERRING DIAG: [id number] (ICD-10-CM) - Benign paroxysmal vertigo, bilateral   THERAPY DIAG:  Unsteadiness on feet  Dizziness and giddiness  ONSET DATE:   05/13/2023  referral  Rationale for Evaluation and Treatment: Rehabilitation  SUBJECTIVE:   SUBJECTIVE STATEMENT: Patient reports doing well. Did put her head in a dependent position picking something up and felt a moment of "swaying." Denies falls.   Pt accompanied by: self  PERTINENT HISTORY: anemia, anxiety, arthritis, DM, GERD, OSA  PAIN:  Are you having pain? No  PRECAUTIONS:  None   WEIGHT BEARING RESTRICTIONS: No  FALLS: Has patient fallen in last 6 months? No  LIVING ENVIRONMENT: Lives with: lives with their family Lives in: House/apartment Stairs: Yes: External: 2-3 steps; on right going up Has following equipment at home: Grab bars  PLOF: Independent driving  PATIENT GOALS: "to continue to improve"   VESTIBULAR ASSESSMENT:  TREATMENT:   Canalith repositioning  POSITIONAL TESTING:  Right Dix-Hallpike: upbeating, right nystagmus and lasting approx 30 seconds with ~5s delayed onset.                                                                                                                           Canalith Repositioning: Epley Right: Number of Reps: 4, Response to Treatment: comment: symptoms improved, and Comment: Very mild low amplitude nystagmus with ~20s delayed onset with bed in trendelenburg for last 2 reps   PATIENT EDUCATION: Education details: cont HEP Person educated: Patient Education method: Explanation Education comprehension: verbalized understanding  HOME EXERCISE PROGRAM:   Sit on edge of bed. 1. Turn head 45 to right. 2. Maintain head position and lie down slowly on left side. Hold until symptoms subside. 3. Sit up slowly. Hold until symptoms subside. 4. Turn head 45 to left. 5. Maintain head position and lie down slowly on right side. Hold until symptoms subside. 6. Sit up slowly. Repeat sequence __3__ times per session. Do __3__ sessions per day. GOALS: Goals reviewed with patient? Yes  SHORT TERM GOALS: = LTG based on PT POC length   LONG TERM GOALS: Target date: 06/27/23  Pt will be independent with final HEP for improved symptom report  Baseline: to be updated PRN  Goal status: INITIAL  2.  Patient will demonstrate (-) positional testing to indicate resolution of BPPV  Baseline: (+) B posterior canal canalithiasis  Goal status: INITIAL  ASSESSMENT:  CLINICAL IMPRESSION: Patient continues to present with (+) R DH with very mild, low amplitude nystagmus that is R torsional upbeating. She reports more "swaying" than spinning. Treated with Epley x4 with bed in trendelenburg last 2 reps. Tolerated well. Continue POC.    OBJECTIVE IMPAIRMENTS: dizziness.   ACTIVITY LIMITATIONS: bending, bed mobility, hygiene/grooming, locomotion level, and caring for others  PARTICIPATION LIMITATIONS: interpersonal relationship, driving, shopping, and community activity  PERSONAL FACTORS: Age, Sex, and Time since onset of injury/illness/exacerbation are also affecting patient's functional outcome.   REHAB POTENTIAL: Good  CLINICAL DECISION MAKING:  Stable/uncomplicated  EVALUATION COMPLEXITY: Low   PLAN:  PT FREQUENCY: 2x/week  PT DURATION: 4 weeks  PLANNED INTERVENTIONS: 97164- PT Re-evaluation, 97110-Therapeutic exercises, 97530- Therapeutic activity, 97112- Neuromuscular re-education, 97535- Self Care, 95621- Manual therapy, [phone]- Gait training, [phone]- Canalith repositioning, Patient/Family education, Balance training, Stair training, Dry Needling, Vestibular training, Visual/preceptual remediation/compensation, and DME instructions  PLAN FOR NEXT SESSION: check BPPV as needed likely with bed in trendelenburg, recommend D/C if all canals still clear (R positive last time but clear at end of session and L had been  clear the last 2 sessions before that)   Westley Foots, PT, DPT, CBIS 06/11/23 11:44 AM

## 2023-06-12 ENCOUNTER — Ambulatory Visit (INDEPENDENT_AMBULATORY_CARE_PROVIDER_SITE_OTHER): Payer: BC Managed Care – PPO | Admitting: Internal Medicine

## 2023-06-12 ENCOUNTER — Telehealth: Payer: Self-pay

## 2023-06-12 ENCOUNTER — Telehealth (INDEPENDENT_AMBULATORY_CARE_PROVIDER_SITE_OTHER): Payer: Self-pay

## 2023-06-12 ENCOUNTER — Encounter (INDEPENDENT_AMBULATORY_CARE_PROVIDER_SITE_OTHER): Payer: Self-pay | Admitting: Internal Medicine

## 2023-06-12 VITALS — BP 104/71 | HR 75 | Temp 98.2°F | Ht 66.0 in | Wt 285.0 lb

## 2023-06-12 DIAGNOSIS — E1169 Type 2 diabetes mellitus with other specified complication: Secondary | ICD-10-CM

## 2023-06-12 DIAGNOSIS — Z7985 Long-term (current) use of injectable non-insulin antidiabetic drugs: Secondary | ICD-10-CM

## 2023-06-12 DIAGNOSIS — Z6841 Body Mass Index (BMI) 40.0 and over, adult: Secondary | ICD-10-CM | POA: Diagnosis not present

## 2023-06-12 DIAGNOSIS — E66813 Obesity, class 3: Secondary | ICD-10-CM | POA: Diagnosis not present

## 2023-06-12 DIAGNOSIS — R638 Other symptoms and signs concerning food and fluid intake: Secondary | ICD-10-CM

## 2023-06-12 MED ORDER — TIRZEPATIDE 10 MG/0.5ML ~~LOC~~ SOAJ: 10.0000 mg | SUBCUTANEOUS | 1 refills | Status: DC

## 2023-06-12 NOTE — Progress Notes (Signed)
Office: [phone]  /  Fax: [phone]  Weight Summary And Biometrics  Vitals Temp: 98.2 F (36.8 C) BP: 104/71 Pulse Rate: 75 SpO2: 99 %   Anthropometric Measurements Height: 5\' 6"  (1.676 m) Weight: 285 lb (129.3 kg) BMI (Calculated): 46.02 Weight at Last Visit: 288 lb Weight Lost Since Last Visit: 3 lb Weight Gained Since Last Visit: 0 lb Starting Weight: 339 lb Total Weight Loss (lbs): 54 lb (24.5 kg) Peak Weight: 339 lb   Body Composition  Body Fat %: 50.8 % Fat Mass (lbs): 145.2 lbs Muscle Mass (lbs): 133.6 lbs Total Body Water (lbs): 91.8 lbs Visceral Fat Rating : 18    No data recorded Today's Visit #: 22  Starting Date: 02/12/22   Subjective   Chief Complaint: Obesity  Karen [name] is here to discuss her progress with her obesity treatment plan. She is on the the Category 3 Plan and states she is following her eating plan approximately 70 % of the time. She states she is exercising 90 minutes 3 times per week.  Weight Progress Since Last Visit:  Discussed the use of AI scribe software for clinical note transcription with the patient, who gave verbal consent to proceed.  History of Present Illness   The patient, with obesity, presents for medical weight management.  She has been experiencing challenges with food cravings and appetite control. After increasing h Zepbound to 10 mg, she noticed a significant reduction in 'food noise' and cravings, particularly for sweets and desserts. She feels like she is back on track with her weight management for the first time since she got sick. She has been more physically active, walking in the pool for an hour and a half, three times a week, and has noticed a decrease in body fat percentage while maintaining muscle mass.  She has type 2 diabetes and is currently managing her condition with medication. She is on a 1500 calorie high-protein diet and is mindful of her carbohydrate intake to manage her blood sugar  levels. No symptoms of low blood sugar such as shakiness or sweating.  She has been experiencing vertigo and is seeing a neurology physical therapist twice a week, which has led to significant improvement. She was previously on a steroid pack before Christmas, which she believes contributed to her challenges with food cravings.  She has sleep apnea and uses a CPAP machine. She reports feeling super tired this morning but generally feels better overall.  She has hypercholesterolemia, although specific details about her current management or symptoms were not discussed.  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 She enjoys participating in these activities and has adjusted her routine to include social outings with them.       Challenges affecting patient progress: none.   Orexigenic Control: Reports improved problems with appetite and hunger signals.  Reports improved problems with satiety and satiation.  Reports improved problems with eating patterns and portion control.  Denies abnormal cravings. Denies feeling deprived or restricted.   Pharmacotherapy for weight management: She is currently taking Metformin (off label use for incretin effect and / or insulin resistance and / or diabetes prevention) with adequate clinical response  and without side effects. and Monjauro with diabetes as the primary indication with adequate clinical response  and without side effects..   Assessment and Plan   Treatment Plan For Obesity:  Recommended Dietary Goals  Karen [name] is currently in the action stage of  change. As such, her goal is to continue weight management plan. She has agreed to: continue current plan  Behavioral Health and Counseling  We discussed the following behavioral modification strategies today: continue to work on maintaining a reduced calorie state, getting the recommended amount of protein, incorporating  whole foods, making healthy choices, staying well hydrated and practicing mindfulness when eating..  Additional education and resources provided today: Personalized instruction on the use of artificial intelligence for recipes, tailored meal plans, calorie tracking, and finding healthier options when eating out.   Recommended Physical Activity Goals  Karen [name] has been advised to work up to 150 minutes of moderate intensity aerobic activity a week and strengthening exercises 2-3 times per week for cardiovascular health, weight loss maintenance and preservation of muscle mass.   She has agreed to :  Continue current level of physical activity   Pharmacotherapy  We discussed various medication options to help Karen [name] with her weight loss efforts and we both agreed to : adequate clinical response to current dose, continue current regimen  Associated Conditions Impacted by Obesity Treatment  Class 3 severe obesity with serious comorbidity and body mass index (BMI) of 50.0 to 59.9 in adult, unspecified obesity type (HCC)  Abnormal food appetite  Type 2 diabetes mellitus with other specified complication, without long-term current use of insulin (HCC) -     Tirzepatide; Inject 10 mg into the skin once a week.  Dispense: 2 mL; Refill: 1    Assessment and Plan    Obesity   Presents for medical weight management. Reports improvement in food cravings and noise after increasing Mounjaro (tirzepatide) to 10 mg. Physical activity has increased, and body fat percentage has decreased while preserving muscle mass. BMI has decreased from 55 to 46. Goal is to reach a BMI of 40 for knee surgery. Discussed the impact of holidays and steroids on weight management and strategies to mitigate future flares. Informed consent provided regarding the use of Mounjaro, including potential side effects and the plan to adjust dosage based on response. Discussed the difference between [facility] and Zepbound, both containing  tirzepatide, and their respective indications for diabetes and weight management.   - Continue Mounjaro 10 mg   - Message before fourth dose to discuss potential dose adjustment   - Hold topiramate   - Follow 1500 calorie high-protein diet   - Use ChatGPT for meal planning and recipes   - Follow up in 4 weeks    Type 2 Diabetes Mellitus   Diabetes managed with Mounjaro (tirzepatide). No symptoms of hypoglycemia reported. Emphasized the importance of a low glycemic index diet to manage blood glucose levels.   - Continue Mounjaro 10 mg   - Follow low glycemic index diet    Vertigo   Reports improvement in vertigo symptoms with neurology physical therapy. Previous treatment with steroids noted to have exacerbated weight management issues.   - Continue neurology physical therapy twice a week    Sleep Apnea   Sleep apnea managed with CPAP. No new issues reported.   - Continue CPAP therapy     General Health Maintenance   Discussed the importance of maintaining a 1500 calorie high-protein diet, regular physical activity, and using tools like ChatGPT for meal planning. Emphasized the need to avoid simple sugars and white flour foods to manage diabetes and support weight loss.   - Follow 1500 calorie high-protein diet   - Engage in regular physical activity   - Use ChatGPT for meal planning and recipes  Follow-up   - Follow up in 4 weeks   - Message before fourth dose of Mounjaro to discuss potential dose adjustment.       Objective   Physical Exam:  Blood pressure 104/71, pulse 75, temperature 98.2 F (36.8 C), height 5\' 6"  (1.676 m), weight 285 lb (129.3 kg), last menstrual period 07/16/2016, SpO2 99%. Body mass index is 46 kg/m.  General: She is overweight, cooperative, alert, well developed, and in no acute distress. PSYCH: Has normal mood, affect and thought process.   HEENT: EOMI, sclerae are anicteric. Lungs: Normal breathing effort, no conversational  dyspnea. Extremities: No edema.  Neurologic: No gross sensory or motor deficits. No tremors or fasciculations noted.    Diagnostic Data Reviewed:  BMET    Component Value Date/Time   NA 140 02/12/2022 1047   K 4.3 02/12/2022 1047   CL 99 02/12/2022 1047   CO2 25 02/12/2022 1047   GLUCOSE 115 (H) 02/12/2022 1047   GLUCOSE 131 (H) 12/02/2017 1619   BUN 18 02/12/2022 1047   CREATININE 0.72 02/12/2022 1047   CALCIUM 9.8 02/12/2022 1047   GFRNONAA 103 04/13/2019 0000   GFRAA 118 04/13/2019 0000   Lab Results  Component Value Date   HGBA1C 6.0 (H) 05/16/2022   HGBA1C 6.3 (H) 04/13/2019   Lab Results  Component Value Date   INSULIN 12.5 02/12/2022   INSULIN 15.1 12/16/2018   Lab Results  Component Value Date   TSH 0.988 02/12/2022   CBC    Component Value Date/Time   WBC 5.9 02/12/2022 1047   WBC 7.9 12/02/2017 1619   RBC 5.06 02/12/2022 1047   RBC 5.57 (H) 12/02/2017 1619   HGB 13.0 02/12/2022 1047   HCT 40.8 02/12/2022 1047   PLT 220 02/12/2022 1047   MCV 81 02/12/2022 1047   MCH 25.7 (L) 02/12/2022 1047   MCH 25.0 (L) 12/02/2017 1619   MCHC 31.9 02/12/2022 1047   MCHC 31.5 12/02/2017 1619   RDW 14.1 02/12/2022 1047   Iron Studies No results found for: "IRON", "TIBC", "FERRITIN", "IRONPCTSAT" Lipid Panel     Component Value Date/Time   CHOL 149 02/12/2022 1047   TRIG 119 02/12/2022 1047   HDL 52 02/12/2022 1047   CHOLHDL 2.9 02/12/2022 1047   LDLCALC 76 02/12/2022 1047   Hepatic Function Panel     Component Value Date/Time   PROT 6.9 02/12/2022 1047   ALBUMIN 4.4 02/12/2022 1047   AST 17 02/12/2022 1047   ALT 14 02/12/2022 1047   ALKPHOS 83 02/12/2022 1047   BILITOT 0.3 02/12/2022 1047      Component Value Date/Time   TSH 0.988 02/12/2022 1047   Nutritional Lab Results  Component Value Date   VD25OH 35.6 10/21/2022   VD25OH 28.4 (L) 07/08/2022   VD25OH 16.3 (L) 02/12/2022    Follow-Up   Return in about 4 weeks (around 07/10/2023) for  For Weight Mangement with Dr. Rikki Spearing.Marland Kitchen She was informed of the importance of frequent follow up visits to maximize her success with intensive lifestyle modifications for her multiple health conditions.  Attestation Statement   Reviewed by clinician on day of visit: allergies, medications, problem list, medical history, surgical history, family history, social history, and previous encounter notes.     Worthy Rancher, MD

## 2023-06-12 NOTE — Telephone Encounter (Signed)
Pt is approved for Mounjaro until 04/2024

## 2023-06-12 NOTE — Telephone Encounter (Signed)
PA submitted through Cover My Meds for [facility]. Awaiting insurance determination. Key: Particia Lather

## 2023-06-17 ENCOUNTER — Encounter: Payer: Self-pay | Admitting: Physical Therapy

## 2023-06-17 ENCOUNTER — Ambulatory Visit: Payer: BC Managed Care – PPO | Attending: Physician Assistant | Admitting: Physical Therapy

## 2023-06-17 VITALS — BP 135/84 | HR 79

## 2023-06-17 DIAGNOSIS — R42 Dizziness and giddiness: Secondary | ICD-10-CM | POA: Diagnosis not present

## 2023-06-17 DIAGNOSIS — R2681 Unsteadiness on feet: Secondary | ICD-10-CM | POA: Diagnosis not present

## 2023-06-17 NOTE — Therapy (Signed)
 OUTPATIENT PHYSICAL THERAPY VESTIBULAR TREATMENT     Patient Name: Karen [name] MRN: [id number] DOB:1965-11-27, 58 y.o., female Today's Date: 06/17/2023  END OF SESSION:  PT End of Session - 06/17/23 0939     Visit Number 6    Number of Visits 9    Date for PT Re-Evaluation 06/27/23    Authorization Type [employer]    PT Start Time [phone]    PT Stop Time 1017    PT Time Calculation (min) 39 min    Equipment Utilized During Treatment Other (comment)    Activity Tolerance Patient tolerated treatment well    Behavior During Therapy WFL for tasks assessed/performed             Past Medical History:  Diagnosis Date   Anemia    iron deficiency   Anxiety    Arthritis    general -coccyx area, elbow   Back pain    Complication of anesthesia    Constipation    Diabetes mellitus without complication (HCC)    Diverticulosis    Fatty liver    GERD (gastroesophageal reflux disease)    IBS (irritable bowel syndrome)    Joint pain    Knee pain    Obesity    OCD (obsessive compulsive disorder)    PONV (postoperative nausea and vomiting)    drop in blood pressure(spinal anesthesia)   Sleep apnea    cpap used automatic   SOBOE (shortness of breath on exertion)    Past Surgical History:  Procedure Laterality Date   ANAL FISSURE REPAIR     '07   BREAST REDUCTION SURGERY     '88   CESAREAN SECTION     x2   COLONOSCOPY WITH PROPOFOL  N/A 02/23/2015   Procedure: COLONOSCOPY WITH PROPOFOL ;  Surgeon: Renaye Sous, MD;  Location: [facility];  Service: Endoscopy;  Laterality: N/A;   ESOPHAGOGASTRODUODENOSCOPY (EGD) WITH PROPOFOL  N/A 07/03/2017   Procedure: ESOPHAGOGASTRODUODENOSCOPY (EGD) WITH PROPOFOL ;  Surgeon: Sous Renaye, MD;  Location: [facility];  Service: Endoscopy;  Laterality: N/A;   GALLBLADDER SURGERY  08/2017   KNEE ARTHROSCOPY W/ ACL RECONSTRUCTION Right    remains with a ? tear.   REDUCTION MAMMAPLASTY Bilateral 1988   TONSILLECTOMY AND ADENOIDECTOMY      Adenoids only   Patient Active Problem List   Diagnosis Date Noted   Abnormal food appetite 04/24/2023   Acute bilateral low back pain without sciatica 02/13/2023   Pure hypercholesterolemia 09/23/2022   Angiomyolipoma 07/18/2022   Abnormal cortisol level 07/11/2022   Mood disorder (HCC)-unspecified 02/26/2022   Other fatigue 02/25/2022   Diabetes mellitus (HCC) 02/25/2022   OSA on CPAP 02/25/2022   Binge eating disorder 02/25/2022   Class 3 obesity with current BMI of 44 02/25/2022   Vitamin D  deficiency 03/18/2019   Type 2 diabetes mellitus without complication, without long-term current use of insulin  (HCC) 03/18/2019   GERD (gastroesophageal reflux disease) 05/03/2018    PCP: Hoy Miu, PA-C REFERRING PROVIDER: Benay Kay, PA-C   REFERRING DIAG: [phone] (ICD-10-CM) - Benign paroxysmal vertigo, bilateral   THERAPY DIAG:  Unsteadiness on feet  Dizziness and giddiness  ONSET DATE:   05/13/2023  referral  Rationale for Evaluation and Treatment: Rehabilitation  SUBJECTIVE:   SUBJECTIVE STATEMENT: Patient reports just a few waves of dizziness since last here. One when looking up her husband on the ladder. Denies falls.   Pt accompanied by: self  PERTINENT HISTORY: anemia, anxiety, arthritis, DM, GERD, OSA  PAIN:  Are you  having pain? No  PRECAUTIONS: None   WEIGHT BEARING RESTRICTIONS: No  FALLS: Has patient fallen in last 6 months? No  LIVING ENVIRONMENT: Lives with: lives with their family Lives in: House/apartment Stairs: Yes: External: 2-3 steps; on right going up Has following equipment at home: Grab bars  PLOF: Independent driving  PATIENT GOALS: to continue to improve   VESTIBULAR ASSESSMENT:  TREATMENT:   Vitals:   06/17/23 0942  BP: 135/84  Pulse: 79   Seated on mat with device on LUE  NMR: (set up for vestibular testing noted below)  Canalith repositioning  POSITIONAL TESTING:  Right Dix-Hallpike: upbeating,  right nystagmus and lasting approx 30 seconds with ~8s delayed onset.                                                                                                                          Canalith Repositioning with mat in trendelenburg: Epley Right: Number of Reps: 2, Response to Treatment: comment: symptoms improved, and Comment: Very mild low amplitude nystagmus that decreases intensity after each rep but appears to be more largely related to resolution of symptoms than    Semont 2x for R sided symptoms as not resolution with Epley - no improvement noted when retested   Canalith Repositioning: Epley Right: Number of Reps: 1, Response to Treatment: comment: symptoms improved, and Comment: No change, still reports no dizziness in position 3  Self Care: given lack of response to treatment this far, discussed possible discharge as have exhausted variations of positional maneuvers and patient stating symptoms are mild enough that they are not impeding on daily life, recommended continuing HEP, discussed today's findings    PATIENT EDUCATION: Education details: cont HEP Person educated: Patient Education method: Explanation Education comprehension: verbalized understanding  HOME EXERCISE PROGRAM:   Sit on edge of bed. 1. Turn head 45 to right. 2. Maintain head position and lie down slowly on left side. Hold until symptoms subside. 3. Sit up slowly. Hold until symptoms subside. 4. Turn head 45 to left. 5. Maintain head position and lie down slowly on right side. Hold until symptoms subside. 6. Sit up slowly. Repeat sequence __3__ times per session. Do __3__ sessions per day. GOALS: Goals reviewed with patient? Yes  SHORT TERM GOALS: = LTG based on PT POC length   LONG TERM GOALS: Target date: 06/27/23  Pt will be independent with final HEP for improved symptom report  Baseline: to be updated PRN  Goal status: INITIAL  2.  Patient will demonstrate (-) positional testing to indicate  resolution of BPPV  Baseline: (+) B posterior canal canalithiasis  Goal status: INITIAL  ASSESSMENT:  CLINICAL IMPRESSION: Patient continues to present with (+) R DH with very mild, low amplitude nystagmus that is R torsional upbeating and despite extensive positional maneuver attempts with mat in trendeleburg, Semont, vibration, and regular Epley, remains unresolved. Discussed modifying POC moving forward and patient in agreement. Continue POC as able.    OBJECTIVE IMPAIRMENTS:  dizziness.   ACTIVITY LIMITATIONS: bending, bed mobility, hygiene/grooming, locomotion level, and caring for others  PARTICIPATION LIMITATIONS: interpersonal relationship, driving, shopping, and community activity  PERSONAL FACTORS: Age, Sex, and Time since onset of injury/illness/exacerbation are also affecting patient's functional outcome.   REHAB POTENTIAL: Good  CLINICAL DECISION MAKING: Stable/uncomplicated  EVALUATION COMPLEXITY: Low   PLAN:  PT FREQUENCY: 2x/week  PT DURATION: 4 weeks  PLANNED INTERVENTIONS: 97164- PT Re-evaluation, 97110-Therapeutic exercises, 97530- Therapeutic activity, 97112- Neuromuscular re-education, 97535- Self Care, 02859- Manual therapy, [phone]- Gait training, [phone]- Canalith repositioning, Patient/Family education, Balance training, Stair training, Dry Needling, Vestibular training, Visual/preceptual remediation/compensation, and DME instructions  PLAN FOR NEXT SESSION: check R BPPV, screen for central involvement and consider early D/C as indicated if still not resolved   Lauraine Grumbling, PT, DPT 06/17/23 12:13 PM

## 2023-06-19 ENCOUNTER — Ambulatory Visit: Payer: BC Managed Care – PPO | Admitting: Physical Therapy

## 2023-06-19 ENCOUNTER — Encounter: Payer: Self-pay | Admitting: Physical Therapy

## 2023-06-19 DIAGNOSIS — R2681 Unsteadiness on feet: Secondary | ICD-10-CM

## 2023-06-19 DIAGNOSIS — R42 Dizziness and giddiness: Secondary | ICD-10-CM

## 2023-06-19 NOTE — Therapy (Signed)
 OUTPATIENT PHYSICAL THERAPY VESTIBULAR TREATMENT/DISCHARGE SUMMARY     Patient Name: Karen [name] MRN: [id number] DOB:1965/11/18, 58 y.o., female Today's Date: 06/19/2023  END OF SESSION:  PT End of Session - 06/19/23 0930     Visit Number 7    Number of Visits 9    Date for PT Re-Evaluation 06/27/23    Authorization Type [employer]    PT Start Time 0929    PT Stop Time [phone]   full time not used due to D/C and resolution of BPPV   PT Time Calculation (min) 23 min    Equipment Utilized During Treatment --    Activity Tolerance Patient tolerated treatment well    Behavior During Therapy WFL for tasks assessed/performed             Past Medical History:  Diagnosis Date   Anemia    iron deficiency   Anxiety    Arthritis    general -coccyx area, elbow   Back pain    Complication of anesthesia    Constipation    Diabetes mellitus without complication (HCC)    Diverticulosis    Fatty liver    GERD (gastroesophageal reflux disease)    IBS (irritable bowel syndrome)    Joint pain    Knee pain    Obesity    OCD (obsessive compulsive disorder)    PONV (postoperative nausea and vomiting)    drop in blood pressure(spinal anesthesia)   Sleep apnea    cpap used automatic   SOBOE (shortness of breath on exertion)    Past Surgical History:  Procedure Laterality Date   ANAL FISSURE REPAIR     '07   BREAST REDUCTION SURGERY     '88   CESAREAN SECTION     x2   COLONOSCOPY WITH PROPOFOL  N/A 02/23/2015   Procedure: COLONOSCOPY WITH PROPOFOL ;  Surgeon: Renaye Sous, MD;  Location: [facility];  Service: Endoscopy;  Laterality: N/A;   ESOPHAGOGASTRODUODENOSCOPY (EGD) WITH PROPOFOL  N/A 07/03/2017   Procedure: ESOPHAGOGASTRODUODENOSCOPY (EGD) WITH PROPOFOL ;  Surgeon: Sous Renaye, MD;  Location: [facility];  Service: Endoscopy;  Laterality: N/A;   GALLBLADDER SURGERY  08/2017   KNEE ARTHROSCOPY W/ ACL RECONSTRUCTION Right    remains with a ? tear.   REDUCTION MAMMAPLASTY  Bilateral 1988   TONSILLECTOMY AND ADENOIDECTOMY     Adenoids only   Patient Active Problem List   Diagnosis Date Noted   Abnormal food appetite 04/24/2023   Acute bilateral low back pain without sciatica 02/13/2023   Pure hypercholesterolemia 09/23/2022   Angiomyolipoma 07/18/2022   Abnormal cortisol level 07/11/2022   Mood disorder (HCC)-unspecified 02/26/2022   Other fatigue 02/25/2022   Diabetes mellitus (HCC) 02/25/2022   OSA on CPAP 02/25/2022   Binge eating disorder 02/25/2022   Class 3 obesity with current BMI of 44 02/25/2022   Vitamin D  deficiency 03/18/2019   Type 2 diabetes mellitus without complication, without long-term current use of insulin  (HCC) 03/18/2019   GERD (gastroesophageal reflux disease) 05/03/2018    PCP: Hoy Miu, PA-C REFERRING PROVIDER: Benay Kay, PA-C   REFERRING DIAG: [phone] (ICD-10-CM) - Benign paroxysmal vertigo, bilateral   THERAPY DIAG:  Unsteadiness on feet  Dizziness and giddiness  ONSET DATE:   05/13/2023  referral  Rationale for Evaluation and Treatment: Rehabilitation  SUBJECTIVE:   SUBJECTIVE STATEMENT: Pt reports doing better. Sometimes she has a bit of a delayed reaction. Now it just happens with certain positions. Feels like she avoids those movements when it happens in  a car. Notes it resolves quick and is not nauseous. Reports it is pretty liveable.   Pt accompanied by: self  PERTINENT HISTORY: anemia, anxiety, arthritis, DM, GERD, OSA  PAIN:  Are you having pain? No  PRECAUTIONS: None   WEIGHT BEARING RESTRICTIONS: No  FALLS: Has patient fallen in last 6 months? No  LIVING ENVIRONMENT: Lives with: lives with their family Lives in: House/apartment Stairs: Yes: External: 2-3 steps; on right going up Has following equipment at home: Grab bars  PLOF: Independent driving  PATIENT GOALS: to continue to improve   VESTIBULAR ASSESSMENT:  TREATMENT:    NMR:   Canalith  repositioning  POSITIONAL TESTING:  Right Dix-Hallpike: upbeating, right nystagmus and lasting approx 20 seconds with ~10 sec delayed onset, very low amplitude nystagmus   After 1 rep of Epley: pt with ~15-20 second delayed onset with very low amplitude nystagmus lasting approx 15-20 seconds                                                                                                                          Canalith Repositioning:  Epley Right: Number of Reps: 2, Response to Treatment: comment: symptoms resolved , and Comment: Pt only with very low amplitude intensity nystagmus in position 1, no nystagmus/dizziness in positions 2 and 3, no dizziness when coming upright. Held each position a little longer     Re-assessed after 2 reps of the Epley with pt demonstrating no nystagmus and having no dizziness. Re-assessed 2 times. When sitting pt back up, pt did report feeling a little dizzy, but pt reports it was probably more of a headrush     PATIENT EDUCATION: Education details: Discussed plan for D/C at this time due to pt's stubborn case of BPPV and pt reports being able to live with very mild symptoms. After 2 reps of the Epley today, pt did have resolution of R posterior canal BPPV. Discussed about continuing to stay well hydrated and performing Wilhelmena Carrel exercises at home as needed. Discussed if BPPV returns in future, then will need a new referral to return.  Person educated: Patient Education method: Explanation Education comprehension: verbalized understanding    PHYSICAL THERAPY DISCHARGE SUMMARY  Visits from Start of Care: 7  Current functional level related to goals / functional outcomes: See LTGs/Clinical Assessment Statement   Remaining deficits: Stubborn R posterior canal BPPV (but it appears to have resolved on 06/19/23), otherwise pt's symptoms were very, very mild    Education / Equipment: HEP, BPPV education    Patient agrees to discharge. Patient goals were met.  Patient is being discharged due to meeting the stated rehab goals. And pt being pleased with current functional status   HOME EXERCISE PROGRAM:   Sit on edge of bed. 1. Turn head 45 to right. 2. Maintain head position and lie down slowly on left side. Hold until symptoms subside. 3. Sit up slowly. Hold until symptoms subside. 4. Turn head 45 to left. 5. Maintain  head position and lie down slowly on right side. Hold until symptoms subside. 6. Sit up slowly. Repeat sequence __3__ times per session. Do __3__ sessions per day. GOALS: Goals reviewed with patient? Yes  SHORT TERM GOALS: = LTG based on PT POC length   LONG TERM GOALS: Target date: 06/27/23  Pt will be independent with final HEP for improved symptom report  Baseline:  Goal status: MET  2.  Patient will demonstrate (-) positional testing to indicate resolution of BPPV  Baseline: (+) B posterior canal canalithiasis   Negative BPPV on 06/19/23  Goal status: MET  ASSESSMENT:  CLINICAL IMPRESSION: Patient continues to present with (+) R DH with very mild, low amplitude nystagmus that is R torsional upbeating and has approx. 15-20 second delayed latency period. Treated with x2 reps of the R Epley maneuver today with holding each position for longer periods of time. Pt demonstrating resolution after 2 reps. Discussed D/C at this time due to pt having very mild symptoms and pt demonstrating resolution of R posterior canal BPPV. Pt is being discharged due to meeting goals and being pleased with current functional status. Pt in agreement to D/C.   OBJECTIVE IMPAIRMENTS: dizziness.   ACTIVITY LIMITATIONS: bending, bed mobility, hygiene/grooming, locomotion level, and caring for others  PARTICIPATION LIMITATIONS: interpersonal relationship, driving, shopping, and community activity  PERSONAL FACTORS: Age, Sex, and Time since onset of injury/illness/exacerbation are also affecting patient's functional outcome.   REHAB POTENTIAL:  Good  CLINICAL DECISION MAKING: Stable/uncomplicated  EVALUATION COMPLEXITY: Low   PLAN:  PT FREQUENCY: 2x/week  PT DURATION: 4 weeks  PLANNED INTERVENTIONS: 97164- PT Re-evaluation, 97110-Therapeutic exercises, 97530- Therapeutic activity, 97112- Neuromuscular re-education, 97535- Self Care, 02859- Manual therapy, [phone]- Gait training, [phone]- Canalith repositioning, Patient/Family education, Balance training, Stair training, Dry Needling, Vestibular training, Visual/preceptual remediation/compensation, and DME instructions  PLAN FOR NEXT SESSION: D/C   Sheffield Senate, PT, DPT 06/19/23 10:06 AM

## 2023-06-24 ENCOUNTER — Encounter: Payer: BC Managed Care – PPO | Admitting: Physical Therapy

## 2023-06-24 ENCOUNTER — Telehealth (INDEPENDENT_AMBULATORY_CARE_PROVIDER_SITE_OTHER): Payer: Self-pay | Admitting: Internal Medicine

## 2023-06-24 NOTE — Telephone Encounter (Signed)
Patient called in requesting for you to call her in reference she has a question about dosage of Mounjaro. Please call patient on her cell phone.

## 2023-06-25 ENCOUNTER — Other Ambulatory Visit (INDEPENDENT_AMBULATORY_CARE_PROVIDER_SITE_OTHER): Payer: Self-pay | Admitting: Internal Medicine

## 2023-06-25 ENCOUNTER — Telehealth (INDEPENDENT_AMBULATORY_CARE_PROVIDER_SITE_OTHER): Payer: Self-pay

## 2023-06-25 DIAGNOSIS — E1169 Type 2 diabetes mellitus with other specified complication: Secondary | ICD-10-CM

## 2023-06-25 DIAGNOSIS — E66813 Obesity, class 3: Secondary | ICD-10-CM

## 2023-06-25 MED ORDER — TIRZEPATIDE 15 MG/0.5ML ~~LOC~~ SOAJ
15.0000 mg | SUBCUTANEOUS | 0 refills | Status: DC
Start: 2023-06-25 — End: 2023-07-10

## 2023-06-25 NOTE — Telephone Encounter (Signed)
Pt called to let you know (she said you requested  this information) that she is tolerating 10 mg of Mounjaro well and would like to increase to 15 mg, pharmacy is [facility].

## 2023-06-25 NOTE — Telephone Encounter (Signed)
Patient called asking that someone call her in reference to her medication dosage increase. Please follow up with the patient.

## 2023-06-25 NOTE — Telephone Encounter (Signed)
Left a message for pt to return call

## 2023-06-26 ENCOUNTER — Encounter: Payer: BC Managed Care – PPO | Admitting: Physical Therapy

## 2023-07-10 ENCOUNTER — Ambulatory Visit (INDEPENDENT_AMBULATORY_CARE_PROVIDER_SITE_OTHER): Payer: BC Managed Care – PPO | Admitting: Internal Medicine

## 2023-07-10 ENCOUNTER — Encounter (INDEPENDENT_AMBULATORY_CARE_PROVIDER_SITE_OTHER): Payer: Self-pay | Admitting: Internal Medicine

## 2023-07-10 DIAGNOSIS — Z6841 Body Mass Index (BMI) 40.0 and over, adult: Secondary | ICD-10-CM | POA: Diagnosis not present

## 2023-07-10 DIAGNOSIS — R638 Other symptoms and signs concerning food and fluid intake: Secondary | ICD-10-CM | POA: Diagnosis not present

## 2023-07-10 DIAGNOSIS — Z7985 Long-term (current) use of injectable non-insulin antidiabetic drugs: Secondary | ICD-10-CM

## 2023-07-10 DIAGNOSIS — E1169 Type 2 diabetes mellitus with other specified complication: Secondary | ICD-10-CM

## 2023-07-10 DIAGNOSIS — E66813 Obesity, class 3: Secondary | ICD-10-CM | POA: Diagnosis not present

## 2023-07-10 MED ORDER — TIRZEPATIDE 15 MG/0.5ML ~~LOC~~ SOAJ: 15.0000 mg | SUBCUTANEOUS | 0 refills | Status: DC

## 2023-07-10 NOTE — Progress Notes (Unsigned)
 He  Office: [phone]  /  Fax: [phone]  Weight Summary And Biometrics  Vitals Temp: 97.9 F (36.6 C) BP: 110/76 Pulse Rate: 68 SpO2: 98 %   Anthropometric Measurements Height: 5\' 6"  (1.676 m) Weight: 281 lb (127.5 kg) BMI (Calculated): 45.38 Weight at Last Visit: 285 lb Weight Lost Since Last Visit: 4 lb Weight Gained Since Last Visit: 0 lb Starting Weight: 339 lb Peak Weight: 339 lb   Body Composition  Body Fat %: 51.7 % Fat Mass (lbs): 145.2 lbs Muscle Mass (lbs): 129 lbs Total Body Water (lbs): 93.2 lbs Visceral Fat Rating : 18    No data recorded Today's Visit #: 23  Starting Date: 02/12/22   Subjective   Chief Complaint: Obesity  Interval History Discussed the use of AI scribe software for clinical note transcription with the patient, who gave verbal consent to proceed.  History of Present Illness   Chace Bisch is a 58 year old female with obesity, type 2 diabetes, and hypercholesterolemia who presents for medical weight management.  She has lost four pounds since her last visit. She follows a category three meal plan about 70-80% of the time, is not tracking calories, but is eating more whole foods and getting the recommended amount of protein. She maintains adequate hydration and has not been skipping meals. She is currently not exercising and reports getting less than seven hours of sleep per night with increased levels of stress.  She is on Mounjaro 15 mg once a week primarily for diabetes and weight management, and takes Topiramate 25 mg. She is also on Metformin 1000 mg twice a day and Jardiance 10 mg for diabetes management. After starting the 15 mg dose of Mounjaro, she noticed a red, raised, and itchy reaction at the injection site, which she manages with cortisone. She has not experienced this with the 10 mg dose. She feels that her hunger signals have calmed down with the 15 mg dose, and the 'food noise' is much better. She has  completed one or two shots of the 15 mg dose.  She has a history of vertigo, which has been recurring since the fall. She attended neuro physical therapy, which initially seemed to resolve the issue, but it returned about two weeks ago with positional changes. She has been taught the Epley maneuver to manage her symptoms at home. The vertigo has impacted her ability to go to the pool for exercise, as she is cautious about driving when she feels unwell. She has not been to the pool for the past three weeks due to fear of driving with vertigo.  She reports a significant weight loss of nearly 60 pounds, reaching a weight of 281 pounds from 341 pounds. She attributes her success to being able to 'reel it in quickly' when she goes off track. She feels pleased with her progress, despite not being able to exercise in the pool recently. She wants to return to the pool as it helps her joints and contributes to her weight loss.       Challenges affecting patient progress:  Vertigo .    Pharmacotherapy for weight management: She is currently taking {EMPharmaco:28845}.   Assessment and Plan   Treatment Plan For Obesity:  Recommended Dietary Goals  Lyberti is currently in the action stage of change. As such, her goal is to continue weight management plan. She has agreed to: {EMWTLOSSPLAN:29297::"continue current plan"}  Behavioral Health and Counseling  We discussed the following behavioral modification strategies today: {EMWMwtlossstrategies:28914::"continue  to work on maintaining a reduced calorie state, getting the recommended amount of protein, incorporating whole foods, making healthy choices, staying well hydrated and practicing mindfulness when eating."}.  Additional education and resources provided today: {EMadditionalresources:29169::"None"}  Recommended Physical Activity Goals  Lanah has been advised to work up to 150 minutes of moderate intensity aerobic activity a week and strengthening  exercises 2-3 times per week for cardiovascular health, weight loss maintenance and preservation of muscle mass.   She has agreed to :  {EMEXERCISE:28847::"Think about enjoyable ways to increase daily physical activity and overcoming barriers to exercise","Increase physical activity in their day and reduce sedentary time (increase NEAT)."}  Pharmacotherapy  We discussed various medication options to help Lianny with her weight loss efforts and we both agreed to : {EMagreedrx:29170}  Associated Conditions Impacted by Obesity Treatment  Abnormal food appetite  Type 2 diabetes mellitus with other specified complication, without long-term current use of insulin (HCC) -     Tirzepatide; Inject 15 mg into the skin once a week.  Dispense: 2 mL; Refill: 0  Class 3 severe obesity with serious comorbidity and body mass index (BMI) of 50.0 to 59.9 in adult, unspecified obesity type (HCC) -     Tirzepatide; Inject 15 mg into the skin once a week.  Dispense: 2 mL; Refill: 0     Objective   Physical Exam:  Blood pressure 110/76, pulse 68, temperature 97.9 F (36.6 C), height 5\' 6"  (1.676 m), weight 281 lb (127.5 kg), last menstrual period 07/16/2016, SpO2 98%. Body mass index is 45.35 kg/m.  General: She is overweight, cooperative, alert, well developed, and in no acute distress. PSYCH: Has normal mood, affect and thought process.   HEENT: EOMI, sclerae are anicteric. Lungs: Normal breathing effort, no conversational dyspnea. Extremities: No edema.  Neurologic: No gross sensory or motor deficits. No tremors or fasciculations noted.    Diagnostic Data Reviewed:  BMET    Component Value Date/Time   NA 140 02/12/2022 1047   K 4.3 02/12/2022 1047   CL 99 02/12/2022 1047   CO2 25 02/12/2022 1047   GLUCOSE 115 (H) 02/12/2022 1047   GLUCOSE 131 (H) 12/02/2017 1619   BUN 18 02/12/2022 1047   CREATININE 0.72 02/12/2022 1047   CALCIUM 9.8 02/12/2022 1047   GFRNONAA 103 04/13/2019 0000    GFRAA 118 04/13/2019 0000   Lab Results  Component Value Date   HGBA1C 6.0 (H) 05/16/2022   HGBA1C 6.3 (H) 04/13/2019   Lab Results  Component Value Date   INSULIN 12.5 02/12/2022   INSULIN 15.1 12/16/2018   Lab Results  Component Value Date   TSH 0.988 02/12/2022   CBC    Component Value Date/Time   WBC 5.9 02/12/2022 1047   WBC 7.9 12/02/2017 1619   RBC 5.06 02/12/2022 1047   RBC 5.57 (H) 12/02/2017 1619   HGB 13.0 02/12/2022 1047   HCT 40.8 02/12/2022 1047   PLT 220 02/12/2022 1047   MCV 81 02/12/2022 1047   MCH 25.7 (L) 02/12/2022 1047   MCH 25.0 (L) 12/02/2017 1619   MCHC 31.9 02/12/2022 1047   MCHC 31.5 12/02/2017 1619   RDW 14.1 02/12/2022 1047   Iron Studies No results found for: "IRON", "TIBC", "FERRITIN", "IRONPCTSAT" Lipid Panel     Component Value Date/Time   CHOL 149 02/12/2022 1047   TRIG 119 02/12/2022 1047   HDL 52 02/12/2022 1047   CHOLHDL 2.9 02/12/2022 1047   LDLCALC 76 02/12/2022 1047   Hepatic Function Panel  Component Value Date/Time   PROT 6.9 02/12/2022 1047   ALBUMIN 4.4 02/12/2022 1047   AST 17 02/12/2022 1047   ALT 14 02/12/2022 1047   ALKPHOS 83 02/12/2022 1047   BILITOT 0.3 02/12/2022 1047      Component Value Date/Time   TSH 0.988 02/12/2022 1047   Nutritional Lab Results  Component Value Date   VD25OH 35.6 10/21/2022   VD25OH 28.4 (L) 07/08/2022   VD25OH 16.3 (L) 02/12/2022    Medications: Outpatient Encounter Medications as of 07/10/2023  Medication Sig   CLOMIPRAMINE HCL PO Take 100 mg by mouth daily. 100 mg daily   empagliflozin (JARDIANCE) 10 MG TABS tablet Take 1 tablet (10 mg total) by mouth daily before breakfast.   fluvoxaMINE (LUVOX) 50 MG tablet Take 50 mg by mouth at bedtime.   hydrOXYzine HCl (ATARAX PO) Take 25 mg by mouth at bedtime. Unknown dosage   metFORMIN (GLUCOPHAGE) 1000 MG tablet Take 1,000 mg by mouth 2 (two) times daily with a meal.   Multiple Vitamins-Minerals (MULTIVITAMIN) tablet Take  1 tablet by mouth daily.   pantoprazole (PROTONIX) 40 MG tablet Take 40 mg by mouth daily.   Prucalopride Succinate (MOTEGRITY PO) Take by mouth daily at 12 noon. Dosage unknow   rosuvastatin (CRESTOR) 10 MG tablet Take 1 tablet (10 mg total) by mouth daily.   VITAMIN D, CHOLECALCIFEROL, PO Take by mouth. OTC dosage for unknown   [DISCONTINUED] tirzepatide (MOUNJARO) 15 MG/0.5ML Pen Inject 15 mg into the skin once a week.   Plecanatide (TRULANCE) 3 MG TABS Take by mouth daily at 12 noon. (Patient not taking: Reported on 07/10/2023)   tirzepatide [facility]) 15 MG/0.5ML Pen Inject 15 mg into the skin once a week.   [DISCONTINUED] topiramate (TOPAMAX) 25 MG tablet Take 1 tablet (25 mg total) by mouth at bedtime. (Patient not taking: Reported on 07/10/2023)   No facility-administered encounter medications on file as of 07/10/2023.     Follow-Up   Return in about 4 weeks (around 08/07/2023) for For Weight Mangement with Dr. Rikki Spearing.Marland Kitchen She was informed of the importance of frequent follow up visits to maximize her success with intensive lifestyle modifications for her multiple health conditions.  Attestation Statement   Reviewed by clinician on day of visit: allergies, medications, problem list, medical history, surgical history, family history, social history, and previous encounter notes.     Worthy Rancher, MD

## 2023-07-11 NOTE — Assessment & Plan Note (Signed)
 Improved on Mounjaro 15 mg a day  She has increased orexigenic signaling, impaired satiety and inhibitory control. 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  In addition to nutritional and behavioral strategies she benefits from ongoing pharmacotherapy.

## 2023-07-11 NOTE — Assessment & Plan Note (Signed)
 Well-controlled and improved.  Currently on Mounjaro 15 mg once a week, metformin and Jardiance without any adverse effects.  Continue current regimen she continues to work on maintaining a diet and reduction in simple sugars.  She is due for disease monitoring labs at her PCPs office in March.

## 2023-07-11 NOTE — Assessment & Plan Note (Signed)
 Lost 4 pounds since last visit. Following category three meal plan 70-80% of the time. Not tracking calories but eating more whole foods, getting recommended protein, and maintaining adequate hydration. Not currently exercising. Reports increased stress and less than 7 hours of sleep per night. On Mounjaro 15 mg once a week for diabetes and weight management. Stopped taking topiramate. Discussed the importance of consistency over perfection in dietary adherence. - Continue Mounjaro 15 mg once a week - Explore low-impact standing aerobic exercises on YouTube - Achieve at least 5,000 steps per day - Focus on consistency over perfection in dietary adherence

## 2023-08-12 ENCOUNTER — Encounter (INDEPENDENT_AMBULATORY_CARE_PROVIDER_SITE_OTHER): Payer: Self-pay | Admitting: Internal Medicine

## 2023-08-12 ENCOUNTER — Ambulatory Visit (INDEPENDENT_AMBULATORY_CARE_PROVIDER_SITE_OTHER): Payer: BC Managed Care – PPO | Admitting: Internal Medicine

## 2023-08-12 VITALS — BP 120/76 | HR 73 | Temp 98.3°F | Ht 66.0 in | Wt 279.0 lb

## 2023-08-12 DIAGNOSIS — E1169 Type 2 diabetes mellitus with other specified complication: Secondary | ICD-10-CM | POA: Diagnosis not present

## 2023-08-12 DIAGNOSIS — E7800 Pure hypercholesterolemia, unspecified: Secondary | ICD-10-CM

## 2023-08-12 DIAGNOSIS — Z7984 Long term (current) use of oral hypoglycemic drugs: Secondary | ICD-10-CM

## 2023-08-12 DIAGNOSIS — G479 Sleep disorder, unspecified: Secondary | ICD-10-CM | POA: Diagnosis not present

## 2023-08-12 DIAGNOSIS — G4733 Obstructive sleep apnea (adult) (pediatric): Secondary | ICD-10-CM

## 2023-08-12 DIAGNOSIS — E66813 Obesity, class 3: Secondary | ICD-10-CM

## 2023-08-12 DIAGNOSIS — Z6841 Body Mass Index (BMI) 40.0 and over, adult: Secondary | ICD-10-CM

## 2023-08-12 DIAGNOSIS — Z7985 Long-term (current) use of injectable non-insulin antidiabetic drugs: Secondary | ICD-10-CM

## 2023-08-12 MED ORDER — TIRZEPATIDE 15 MG/0.5ML ~~LOC~~ SOAJ: 15.0000 mg | SUBCUTANEOUS | 0 refills | Status: DC

## 2023-08-12 NOTE — Progress Notes (Signed)
 Office: [phone]  /  Fax: [phone]  Weight Summary And Biometrics  Vitals Temp: 98.3 F (36.8 C) BP: 120/76 Pulse Rate: 73 SpO2: 99 %   Anthropometric Measurements Height: 5\' 6"  (1.676 m) Weight: 279 lb (126.6 kg) BMI (Calculated): 45.05 Weight at Last Visit: 251 lb Weight Lost Since Last Visit: 2 lb Weight Gained Since Last Visit: 0 lb Starting Weight: 339 lb Total Weight Loss (lbs): 60 lb (27.2 kg) Peak Weight: 339 lb   Body Composition  Body Fat %: 51.7 % Fat Mass (lbs): 144.2 lbs Muscle Mass (lbs): 128 lbs Total Body Water (lbs): 93.4 lbs Visceral Fat Rating : 18    No data recorded Today's Visit #: 24  Starting Date: 02/12/22   Subjective   Chief Complaint: Obesity  Interval History Discussed the use of AI scribe software for clinical note transcription with the patient, who gave verbal consent to proceed.  History of Present Illness Genea Rheaume is a 58 year old female with obesity who presents for medical weight management.  She is on a 1500 calorie diet plan and has lost two pounds, which she finds disappointing. She engages in aqua walking for about 90 minutes twice a week. Her metabolic rate has decreased from 2794 calories to 1943 calories due to weight loss.  She has type 2 diabetes, with an A1c that increased from 5.6 in May to 6.9 in November of the previous year. She is sensitive to carbohydrates, which affects her insulin levels and weight loss. She is currently on Mounjaro, metformin and Jardiance for diabetes management.  She has hypercholesterolemia, with a recorded LDL cholesterol level of 131. She is on rosuvastatin for cholesterol management.  She has obstructive sleep apnea (OSA) and uses a CPAP machine. She experiences sleep disturbances due to post-menopausal symptoms, including night sweats and insomnia. She has tried various medications, to manage her sleep issues but finds them only partially effective. She feels  groggy after taking sleep aids and tries not to use them every night. Her father also has sleep apnea and uses a CPAP machine. She is concerned about the long-term need for CPAP despite weight loss    Challenges affecting patient progress: medical comorbidities and inadequate sleep.    Pharmacotherapy for weight management: She is currently taking Metformin with diabetes as primary indication with adequate clinical response  and without side effects. and Monjauro with diabetes as the primary indication with adequate clinical response  and without side effects..   Assessment and Plan   Treatment Plan For Obesity:  Recommended Dietary Goals  Adiel is currently in the action stage of change. As such, her goal is to continue weight management plan. She has agreed to: follow the Category 3 plan - 1500 kcal per day  Behavioral Health and Counseling  We discussed the following behavioral modification strategies today: increasing lean protein intake to established goals, decreasing simple carbohydrates , increasing water intake , and work on tracking and journaling calories using tracking application.  Additional education and resources provided today: None  Recommended Physical Activity Goals  Francena has been advised to work up to 150 minutes of moderate intensity aerobic activity a week and strengthening exercises 2-3 times per week for cardiovascular health, weight loss maintenance and preservation of muscle mass.   She has agreed to :  continue to gradually increase the amount and intensity of exercise routine  Pharmacotherapy  We discussed various medication options to help Tamaka with her weight loss efforts and we both  agreed to : adequate clinical response to current dose, continue current regimen  Associated Conditions Impacted by Obesity Treatment  OSA on CPAP  Type 2 diabetes mellitus with other specified complication, without long-term current use of insulin (HCC) -      Tirzepatide; Inject 15 mg into the skin once a week.  Dispense: 6 mL; Refill: 0  Class 3 severe obesity with serious comorbidity and body mass index (BMI) of 50.0 to 59.9 in adult, unspecified obesity type (HCC) -     Tirzepatide; Inject 15 mg into the skin once a week.  Dispense: 6 mL; Refill: 0  Pure hypercholesterolemia  Sleep difficulties    Assessment and Plan Assessment & Plan Obesity Imagine is undergoing medical weight management and has achieved an 18% body weight reduction. Her metabolic rate is 1914 calories, and she follows a 1500 calorie diet to maintain a caloric deficit. She engages in 90 minutes of aqua walking twice weekly. The impact of carbohydrates on insulin production and weight loss was discussed, with caution advised due to her diabetes. Emphasis was placed on maintaining a caloric deficit through diet and exercise. - Continue 1500 calorie diet with moderate protein intake - Increase physical activity, focusing on duration and frequency - Monitor carbohydrate intake to manage insulin levels  Type 2 Diabetes Mellitus Meily has type 2 diabetes with a recent A1c of 6.9, up from 5.6. She is on GLP-1 receptor agonist therapy with Mounjaro, metformin, and Jardiance, which aids in caloric deficit. Managing carbohydrate intake to control insulin levels and support weight loss was emphasized. Insulin's impact on weight loss was discussed, noting it can slow weight loss despite a caloric deficit. - Continue GLP-1 receptor agonist therapy with Mounjaro - Continue metformin and Jardiance - Monitor carbohydrate intake to manage insulin levels  Obstructive Sleep Apnea (OSA) Mystery has OSA and uses CPAP therapy. Weight loss can significantly reduce intrathoracic and abdominal pressures, potentially improving OSA. She has lost more than 15% of her body weight, and a future sleep study may be warranted to reassess OSA severity and CPAP necessity. Transitioning to an oral appliance if OSA  improves to mild was mentioned. - Consider future sleep study to reassess OSA severity after further weight loss - Evaluate potential for oral appliance therapy if OSA improves to mild  Hypercholesterolemia Chizaram has hypercholesterolemia with an LDL cholesterol level of 131. She is on rosuvastatin therapy. -Her cholesterol needs to be repeated, if her LDL cholesterol still above 100 I recommend intensification of statin therapy - Continue rosuvastatin therapy  Insomnia related to Menopause Brighton experiences insomnia related to menopause, with night sweats and difficulty sleeping. She has tried some anticholinergics with limited success. The potential use of Ambien (once to twice a week to catch up on sleep) on an as-needed basis and clonidine for night sweats and sleep aid was discussed.  She will review this further with her primary care team.  The importance of not relying on sleep medications nightly was emphasized due to potential habit formation. Adequate sleep's role in maintaining metabolic rate and overall health was highlighted. - Discuss with PCP the potential use of Ambien on an as-needed basis - Consider clonidine for night sweats and sleep aid - Schedule follow-up appointment with PCP in six weeks to address sleep issues  General Health Maintenance Maintaining a balanced diet, regular physical activity, and adequate sleep are crucial for Damyah's overall health. Behavioral modifications to avoid processed foods and manage carbohydrate intake were discussed. - Continue balanced diet with  focus on protein intake - Increase physical activity - Implement behavioral modifications to avoid processed foods  Follow-up Plans to check in with Tehillah in four weeks to assess progress and adjust her treatment plan. Prescription management for Greggory Keen was discussed, including a 90-day supply for convenience. - Schedule follow-up appointment in four weeks - Send prescription to [facility] for [facility]  with refills - Consider 90-day supply of Mounjaro if available      Objective   Physical Exam:  Blood pressure 120/76, pulse 73, temperature 98.3 F (36.8 C), height 5\' 6"  (1.676 m), weight 279 lb (126.6 kg), last menstrual period 07/16/2016, SpO2 99%. Body mass index is 45.03 kg/m.  General: She is overweight, cooperative, alert, well developed, and in no acute distress. PSYCH: Has normal mood, affect and thought process.   HEENT: EOMI, sclerae are anicteric. Lungs: Normal breathing effort, no conversational dyspnea. Extremities: No edema.  Neurologic: No gross sensory or motor deficits. No tremors or fasciculations noted.    Diagnostic Data Reviewed:  BMET    Component Value Date/Time   NA 140 02/12/2022 1047   K 4.3 02/12/2022 1047   CL 99 02/12/2022 1047   CO2 25 02/12/2022 1047   GLUCOSE 115 (H) 02/12/2022 1047   GLUCOSE 131 (H) 12/02/2017 1619   BUN 18 02/12/2022 1047   CREATININE 0.72 02/12/2022 1047   CALCIUM 9.8 02/12/2022 1047   GFRNONAA 103 04/13/2019 0000   GFRAA 118 04/13/2019 0000   Lab Results  Component Value Date   HGBA1C 6.0 (H) 05/16/2022   HGBA1C 6.3 (H) 04/13/2019   Lab Results  Component Value Date   INSULIN 12.5 02/12/2022   INSULIN 15.1 12/16/2018   Lab Results  Component Value Date   TSH 0.988 02/12/2022   CBC    Component Value Date/Time   WBC 5.9 02/12/2022 1047   WBC 7.9 12/02/2017 1619   RBC 5.06 02/12/2022 1047   RBC 5.57 (H) 12/02/2017 1619   HGB 13.0 02/12/2022 1047   HCT 40.8 02/12/2022 1047   PLT 220 02/12/2022 1047   MCV 81 02/12/2022 1047   MCH 25.7 (L) 02/12/2022 1047   MCH 25.0 (L) 12/02/2017 1619   MCHC 31.9 02/12/2022 1047   MCHC 31.5 12/02/2017 1619   RDW 14.1 02/12/2022 1047   Iron Studies No results found for: "IRON", "TIBC", "FERRITIN", "IRONPCTSAT" Lipid Panel     Component Value Date/Time   CHOL 149 02/12/2022 1047   TRIG 119 02/12/2022 1047   HDL 52 02/12/2022 1047   CHOLHDL 2.9 02/12/2022  1047   LDLCALC 76 02/12/2022 1047   Hepatic Function Panel     Component Value Date/Time   PROT 6.9 02/12/2022 1047   ALBUMIN 4.4 02/12/2022 1047   AST 17 02/12/2022 1047   ALT 14 02/12/2022 1047   ALKPHOS 83 02/12/2022 1047   BILITOT 0.3 02/12/2022 1047      Component Value Date/Time   TSH 0.988 02/12/2022 1047   Nutritional Lab Results  Component Value Date   VD25OH 35.6 10/21/2022   VD25OH 28.4 (L) 07/08/2022   VD25OH 16.3 (L) 02/12/2022    Medications: Outpatient Encounter Medications as of 08/12/2023  Medication Sig   CLOMIPRAMINE HCL PO Take 100 mg by mouth daily. 100 mg daily   empagliflozin (JARDIANCE) 10 MG TABS tablet Take 1 tablet (10 mg total) by mouth daily before breakfast.   fluvoxaMINE (LUVOX) 50 MG tablet Take 50 mg by mouth at bedtime.   hydrOXYzine HCl (ATARAX PO) Take 25 mg by  mouth at bedtime. Unknown dosage   metFORMIN (GLUCOPHAGE) 1000 MG tablet Take 1,000 mg by mouth 2 (two) times daily with a meal.   Multiple Vitamins-Minerals (MULTIVITAMIN) tablet Take 1 tablet by mouth daily.   pantoprazole (PROTONIX) 40 MG tablet Take 40 mg by mouth daily.   Plecanatide (TRULANCE) 3 MG TABS Take by mouth daily at 12 noon.   Prucalopride Succinate (MOTEGRITY PO) Take by mouth daily at 12 noon. Dosage unknow   rosuvastatin (CRESTOR) 10 MG tablet Take 1 tablet (10 mg total) by mouth daily.   VITAMIN D, CHOLECALCIFEROL, PO Take by mouth. OTC dosage for unknown   [DISCONTINUED] tirzepatide (MOUNJARO) 15 MG/0.5ML Pen Inject 15 mg into the skin once a week.   tirzepatide (MOUNJARO) 15 MG/0.5ML Pen Inject 15 mg into the skin once a week.   No facility-administered encounter medications on file as of 08/12/2023.     Follow-Up   No follow-ups on file.Marland Kitchen She was informed of the importance of frequent follow up visits to maximize her success with intensive lifestyle modifications for her multiple health conditions.  Attestation Statement   Reviewed by clinician on day of  visit: allergies, medications, problem list, medical history, surgical history, family history, social history, and previous encounter notes.   I have spent 40 minutes in the care of the patient today including: preparing to see patient (e.g. review and interpretation of tests, old notes ), obtaining and/or reviewing separately obtained history, performing a medically appropriate examination or evaluation, counseling and educating the patient, ordering medications, test or procedures, documenting clinical information in the electronic or other health care record, and independently interpreting results and communicating results to the patient, family, or caregiver    Worthy Rancher, MD

## 2023-08-21 ENCOUNTER — Telehealth: Payer: Self-pay

## 2023-08-21 NOTE — Telephone Encounter (Signed)
 Copied from CRM [id number]. Topic: Clinical - Prescription Issue >> Aug 20, 2023  2:20 PM Konrad Dolores wrote: Reason for CRM: Patient called and stated she needs numerous things to be faxed to [phone] [facility] for CPAP supplies. Patient stated they are requesting the face to face notes from recent visit, an agreement that the patient is benefiting from the machine, a copy of the sleep study, the demographics for the patient, and a new order for the supplies all faxed. Patient stated she needs everything in terms of supplies for the CPAP machine such as hose, facemask, filters, chinstrap. Please follow up with the patient at [phone].  ATC X1. LMTCB

## 2023-08-28 NOTE — Telephone Encounter (Signed)
 Pt states she was referred to [facility] since her other dme closed. Pt states that [facility] sent over a fax for us  because she needs CPAP supplies. I informed pt that we have not seen her since 2021 and she would have to establish with our office as a new pt in order for us  to RX or send anything for her. I informed pt that she could make an appointment, and once we see her, we could send over supplies. Pt verbalized understanding.   Routing to front office for scheduling- must be a new pt. She saw Dr Linder Revere in the past.

## 2023-09-16 ENCOUNTER — Ambulatory Visit (INDEPENDENT_AMBULATORY_CARE_PROVIDER_SITE_OTHER): Admitting: Internal Medicine

## 2023-09-16 ENCOUNTER — Encounter (INDEPENDENT_AMBULATORY_CARE_PROVIDER_SITE_OTHER): Payer: Self-pay | Admitting: Internal Medicine

## 2023-09-16 VITALS — BP 128/80 | HR 71 | Temp 98.4°F | Ht 66.0 in | Wt 271.0 lb

## 2023-09-16 DIAGNOSIS — E669 Obesity, unspecified: Secondary | ICD-10-CM | POA: Insufficient documentation

## 2023-09-16 DIAGNOSIS — E7800 Pure hypercholesterolemia, unspecified: Secondary | ICD-10-CM

## 2023-09-16 DIAGNOSIS — Z7984 Long term (current) use of oral hypoglycemic drugs: Secondary | ICD-10-CM

## 2023-09-16 DIAGNOSIS — E1169 Type 2 diabetes mellitus with other specified complication: Secondary | ICD-10-CM

## 2023-09-16 DIAGNOSIS — G4733 Obstructive sleep apnea (adult) (pediatric): Secondary | ICD-10-CM

## 2023-09-16 DIAGNOSIS — R638 Other symptoms and signs concerning food and fluid intake: Secondary | ICD-10-CM | POA: Diagnosis not present

## 2023-09-16 DIAGNOSIS — E66813 Obesity, class 3: Secondary | ICD-10-CM | POA: Insufficient documentation

## 2023-09-16 DIAGNOSIS — Z6841 Body Mass Index (BMI) 40.0 and over, adult: Secondary | ICD-10-CM | POA: Insufficient documentation

## 2023-09-16 DIAGNOSIS — Z7985 Long-term (current) use of injectable non-insulin antidiabetic drugs: Secondary | ICD-10-CM

## 2023-09-16 NOTE — Assessment & Plan Note (Addendum)
 On CPAP with reported good compliance. Continue PAP therapy.  Consider repeating sleep study as she has lost approximately 21% of total body weight therefore possibly reducing AHI.  Continue current weight management strategy inclusive of GLP-1

## 2023-09-16 NOTE — Assessment & Plan Note (Signed)
 I reviewed labs in Care Everywhere her LDL cholesterol is above target.  She is now on rosuvastatin  and will be due for lipids.  She has blood work scheduled for tomorrow I recommend a CMP, A1c, urine albumin and lipid profile.

## 2023-09-16 NOTE — Assessment & Plan Note (Signed)
 Type 2 diabetes mellitus with recent A1c of 6.9%. Current management includes metformin 1000 mg twice daily, Mounjaro 15 mg weekly, and Jardiance  10 mg daily. She has lost 68 pounds, approximately 21% of total body weight, since starting the supervised weight loss plan. Blood pressure is well-controlled at 128/80 mmHg. Emphasis on maintaining A1c closer to 6.4% and LDL cholesterol less than 329 mg/dL to reduce cardiovascular risk. Kidney function monitoring is essential due to Jardiance  use. - Order A1c and kidney function tests (CMP) - Ensure urine microalbumin is checked - Continue metformin 1000 mg twice daily - Continue Mounjaro 15 mg weekly - Continue Jardiance  10 mg daily

## 2023-09-16 NOTE — Progress Notes (Signed)
 Office: [phone]  /  Fax: [phone]  Weight Summary And Biometrics  Vitals Temp: 98.4 F (36.9 C) BP: 128/80 Pulse Rate: 71 SpO2: 100 %   Anthropometric Measurements Height: 5\' 6"  (1.676 m) Weight: 271 lb (122.9 kg) BMI (Calculated): 43.76 Weight at Last Visit: 279 lb Weight Lost Since Last Visit: 8 lb Weight Gained Since Last Visit: 0 lb Starting Weight: 339 lb Total Weight Loss (lbs): 68 lb (30.8 kg) Peak Weight: 339 lb   Body Composition  Body Fat %: 47.6 % Fat Mass (lbs): 129 lbs Muscle Mass (lbs): 134.8 lbs Total Body Water (lbs): 92.2 lbs Visceral Fat Rating : 16    RMR: 2794  Today's Visit #: 24  Starting Date: 02/12/22   Subjective   Chief Complaint: Obesity  Interval History Discussed the use of AI scribe software for clinical note transcription with the patient, who gave verbal consent to proceed.  History of Present Illness   Karen [name] is a 58 year old female with type 2 diabetes, sleep apnea, and hypercholesterolemia who presents for medical weight management.  She is actively participating in a medical weight management program and has achieved a significant weight loss of approximately 68 pounds, equating to 21% of her total body weight. Since her last visit, she has lost an additional 8 pounds. Her dietary regimen includes a 1500 calorie meal plan, which she adheres to 70-80% of the time, focusing on whole foods and adequate protein intake. She denies skipping meals. Her exercise routine consists of 90 minutes of cardio, yoga, and stretching three days a week, along with walking for 90 minutes three times a week, with plans to increase to five days a week.  She manages her type 2 diabetes with metformin 1000 mg twice daily, Mounjaro 15 mg weekly, and Jardiance  10 mg daily. Her last hemoglobin A1c was 6.9, and she is scheduled for blood work tomorrow, including A1c and kidney function tests. She undergoes A1c checks every six  months.  For hypercholesterolemia, she takes rosuvastatin  10 mg. Her LDL was 131 in November, and she is due for a cholesterol check. She aims for an LDL less than 100.  She has sleep apnea and uses CPAP therapy.  She consumes two 32-ounce servings of unsweet tea daily and a 16-ounce bottle of flavored water. Her urine is light yellow to clear, and she does not experience thirst or have chapped lips.       Challenges affecting patient progress: medical comorbidities and menopause.    Pharmacotherapy for weight management: She is currently taking Metformin with diabetes as primary indication with adequate clinical response  and without side effects., Monjauro with diabetes as the primary indication with adequate clinical response  and without side effects., and Jardiance  .   Assessment and Plan   Treatment Plan For Obesity:  Recommended Dietary Goals  Karen [name] is currently in the action stage of change. As such, her goal is to continue weight management plan. She has agreed to: continue current plan  Behavioral Health and Counseling  We discussed the following behavioral modification strategies today: continue to work on maintaining a reduced calorie state, getting the recommended amount of protein, incorporating whole foods, making healthy choices, staying well hydrated and practicing mindfulness when eating..  Additional education and resources provided today: None  Recommended Physical Activity Goals  Karen [name] has been advised to work up to 150 minutes of moderate intensity aerobic activity a week and strengthening exercises 2-3 times per week for cardiovascular health,  weight loss maintenance and preservation of muscle mass.   She has agreed to :  continue to gradually increase the amount and intensity of exercise routine  Pharmacotherapy  We discussed various medication options to help Karen [name] with her weight loss efforts and we both agreed to : adequate clinical response to  anti-obesity medication, continue current regimen  Associated Conditions Impacted by Obesity Treatment  OSA on CPAP Assessment & Plan: On CPAP with reported good compliance. Continue PAP therapy.  Consider repeating sleep study as she has lost approximately 21% of total body weight therefore possibly reducing AHI.  Continue current weight management strategy inclusive of GLP-1    Type 2 diabetes mellitus with other specified complication, without long-term current use of insulin  (HCC) Assessment & Plan: Type 2 diabetes mellitus with recent A1c of 6.9%. Current management includes metformin 1000 mg twice daily, Mounjaro 15 mg weekly, and Jardiance  10 mg daily. She has lost 68 pounds, approximately 21% of total body weight, since starting the supervised weight loss plan. Blood pressure is well-controlled at 128/80 mmHg. Emphasis on maintaining A1c closer to 6.4% and LDL cholesterol less than 161 mg/dL to reduce cardiovascular risk. Kidney function monitoring is essential due to Jardiance  use. - Order A1c and kidney function tests (CMP) - Ensure urine microalbumin is checked - Continue metformin 1000 mg twice daily - Continue Mounjaro 15 mg weekly - Continue Jardiance  10 mg daily   Pure hypercholesterolemia Assessment & Plan: I reviewed labs in Care Everywhere her LDL cholesterol is above target.  She is now on rosuvastatin  and will be due for lipids.  She has blood work scheduled for tomorrow I recommend a CMP, A1c, urine albumin and lipid profile.   Abnormal food appetite Assessment & Plan: Improved on Mounjaro 15 mg a day  She has increased orexigenic signaling, impaired satiety and inhibitory control. 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  In addition to nutritional and behavioral strategies she benefits from ongoing pharmacotherapy.      Class 3 severe obesity with serious comorbidity and  body mass index (BMI) of 40.0 to 44.9 in adult Assessment & Plan: Thus far she has lost 68 pounds or 21% of total body weight on medically supervised weight management plan inclusive of GLP-1.  She will continue with current weight management strategy             Objective   Physical Exam:  Blood pressure 128/80, pulse 71, temperature 98.4 F (36.9 C), height 5\' 6"  (1.676 m), weight 271 lb (122.9 kg), last menstrual period 07/16/2016, SpO2 100%. Body mass index is 43.74 kg/m.  General: She is overweight, cooperative, alert, well developed, and in no acute distress. PSYCH: Has normal mood, affect and thought process.   HEENT: EOMI, sclerae are anicteric. Lungs: Normal breathing effort, no conversational dyspnea. Extremities: No edema.  Neurologic: No gross sensory or motor deficits. No tremors or fasciculations noted.    Diagnostic Data Reviewed:  BMET    Component Value Date/Time   NA 140 02/12/2022 1047   K 4.3 02/12/2022 1047   CL 99 02/12/2022 1047   CO2 25 02/12/2022 1047   GLUCOSE 115 (H) 02/12/2022 1047   GLUCOSE 131 (H) 12/02/2017 1619   BUN 18 02/12/2022 1047   CREATININE 0.72 02/12/2022 1047   CALCIUM  9.8 02/12/2022 1047   GFRNONAA 103 04/13/2019 0000   GFRAA 118 04/13/2019 0000   Lab Results  Component Value Date   HGBA1C  6.0 (H) 05/16/2022   HGBA1C 6.3 (H) 04/13/2019   Lab Results  Component Value Date   INSULIN  12.5 02/12/2022   INSULIN  15.1 12/16/2018   Lab Results  Component Value Date   TSH 0.988 02/12/2022   CBC    Component Value Date/Time   WBC 5.9 02/12/2022 1047   WBC 7.9 12/02/2017 1619   RBC 5.06 02/12/2022 1047   RBC 5.57 (H) 12/02/2017 1619   HGB 13.0 02/12/2022 1047   HCT 40.8 02/12/2022 1047   PLT 220 02/12/2022 1047   MCV 81 02/12/2022 1047   MCH 25.7 (L) 02/12/2022 1047   MCH 25.0 (L) 12/02/2017 1619   MCHC 31.9 02/12/2022 1047   MCHC 31.5 12/02/2017 1619   RDW 14.1 02/12/2022 1047   Iron Studies No results found  for: "IRON", "TIBC", "FERRITIN", "IRONPCTSAT" Lipid Panel     Component Value Date/Time   CHOL 149 02/12/2022 1047   TRIG 119 02/12/2022 1047   HDL 52 02/12/2022 1047   CHOLHDL 2.9 02/12/2022 1047   LDLCALC 76 02/12/2022 1047   Hepatic Function Panel     Component Value Date/Time   PROT 6.9 02/12/2022 1047   ALBUMIN 4.4 02/12/2022 1047   AST 17 02/12/2022 1047   ALT 14 02/12/2022 1047   ALKPHOS 83 02/12/2022 1047   BILITOT 0.3 02/12/2022 1047      Component Value Date/Time   TSH 0.988 02/12/2022 1047   Nutritional Lab Results  Component Value Date   VD25OH 35.6 10/21/2022   VD25OH 28.4 (L) 07/08/2022   VD25OH 16.3 (L) 02/12/2022    Medications: Outpatient Encounter Medications as of 09/16/2023  Medication Sig   CLOMIPRAMINE HCL PO Take 100 mg by mouth daily. 100 mg daily   empagliflozin  (JARDIANCE ) 10 MG TABS tablet Take 1 tablet (10 mg total) by mouth daily before breakfast.   fluvoxaMINE (LUVOX) 50 MG tablet Take 50 mg by mouth at bedtime.   hydrOXYzine HCl (ATARAX PO) Take 25 mg by mouth at bedtime. Unknown dosage   metFORMIN (GLUCOPHAGE) 1000 MG tablet Take 1,000 mg by mouth 2 (two) times daily with a meal.   Multiple Vitamins-Minerals (MULTIVITAMIN) tablet Take 1 tablet by mouth daily.   pantoprazole (PROTONIX) 40 MG tablet Take 40 mg by mouth daily.   Prucalopride Succinate (MOTEGRITY PO) Take by mouth daily at 12 noon. Dosage unknow   rosuvastatin  (CRESTOR ) 10 MG tablet Take 1 tablet (10 mg total) by mouth daily.   tirzepatide (MOUNJARO) 15 MG/0.5ML Pen Inject 15 mg into the skin once a week.   VITAMIN D , CHOLECALCIFEROL, PO Take by mouth. OTC dosage for unknown   [DISCONTINUED] Plecanatide (TRULANCE) 3 MG TABS Take by mouth daily at 12 noon.   No facility-administered encounter medications on file as of 09/16/2023.     Follow-Up   Return in about 4 weeks (around 10/14/2023) for For Weight Mangement with Dr. Allie Area.Aaron Aas She was informed of the importance of  frequent follow up visits to maximize her success with intensive lifestyle modifications for her multiple health conditions.  Attestation Statement   Reviewed by clinician on day of visit: allergies, medications, problem list, medical history, surgical history, family history, social history, and previous encounter notes.     Ladd Picker, MD

## 2023-09-16 NOTE — Assessment & Plan Note (Signed)
 Improved on Mounjaro 15 mg a day  She has increased orexigenic signaling, impaired satiety and inhibitory control. 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  In addition to nutritional and behavioral strategies she benefits from ongoing pharmacotherapy.

## 2023-09-16 NOTE — Assessment & Plan Note (Signed)
 Thus far she has lost 68 pounds or 21% of total body weight on medically supervised weight management plan inclusive of GLP-1.  She will continue with current weight management strategy

## 2023-09-17 DIAGNOSIS — M79672 Pain in left foot: Secondary | ICD-10-CM | POA: Diagnosis not present

## 2023-09-17 DIAGNOSIS — G4733 Obstructive sleep apnea (adult) (pediatric): Secondary | ICD-10-CM | POA: Diagnosis not present

## 2023-09-17 DIAGNOSIS — Z1331 Encounter for screening for depression: Secondary | ICD-10-CM | POA: Diagnosis not present

## 2023-09-17 DIAGNOSIS — E782 Mixed hyperlipidemia: Secondary | ICD-10-CM | POA: Diagnosis not present

## 2023-09-17 DIAGNOSIS — E1165 Type 2 diabetes mellitus with hyperglycemia: Secondary | ICD-10-CM | POA: Diagnosis not present

## 2023-10-01 ENCOUNTER — Telehealth: Payer: Self-pay | Admitting: Nurse Practitioner

## 2023-10-01 NOTE — Telephone Encounter (Signed)
 Cmn received from [facility] for PAP w/ supplies.

## 2023-10-07 ENCOUNTER — Ambulatory Visit (INDEPENDENT_AMBULATORY_CARE_PROVIDER_SITE_OTHER): Admitting: Nurse Practitioner

## 2023-10-07 ENCOUNTER — Encounter: Payer: Self-pay | Admitting: Nurse Practitioner

## 2023-10-07 VITALS — BP 116/84 | HR 75 | Ht 66.0 in | Wt 276.0 lb

## 2023-10-07 DIAGNOSIS — Z6841 Body Mass Index (BMI) 40.0 and over, adult: Secondary | ICD-10-CM

## 2023-10-07 DIAGNOSIS — R5383 Other fatigue: Secondary | ICD-10-CM

## 2023-10-07 DIAGNOSIS — E66813 Obesity, class 3: Secondary | ICD-10-CM

## 2023-10-07 DIAGNOSIS — G4733 Obstructive sleep apnea (adult) (pediatric): Secondary | ICD-10-CM

## 2023-10-07 DIAGNOSIS — Z9981 Dependence on supplemental oxygen: Secondary | ICD-10-CM

## 2023-10-07 NOTE — Patient Instructions (Addendum)
 Continue to use CPAP every night, minimum of 4-6 hours a night.  Change equipment as directed. Wash your tubing with warm soap and water daily, hang to dry. Wash humidifier portion weekly. Use bottled, distilled water and change daily Be aware of reduced alertness and do not drive or operate heavy machinery if experiencing this or drowsiness.  Exercise encouraged, as tolerated. Healthy weight management discussed.  Avoid or decrease alcohol consumption and medications that make you more sleepy, if possible. Notify if persistent daytime sleepiness occurs even with consistent use of PAP therapy.  Orders placed for new CPAP I also gave you a prescription for a travel CPAP if you want to look into getting one of these You can talk to your insurance about coverage. I do have some patients who use these as their main CPAP as well but I would make sure you like it and feel well controlled with it before making this decision  We discussed how untreated sleep apnea puts an individual at risk for cardiac arrhthymias, pulm HTN, DM, stroke and increases their risk for daytime accidents.   Orders placed for new supplies  Follow up in 10-12 weeks with Karen Evalie Hargraves,NP, or sooner, if needed

## 2023-10-07 NOTE — Assessment & Plan Note (Signed)
 BMI 44. 65 lb weight loss. Encouraged to continue healthy weight loss measures.

## 2023-10-07 NOTE — Assessment & Plan Note (Signed)
 Seems to be postmenopausal/hormonal changes. Well controlled on CPAP. Encouraged to work on exercise. See above

## 2023-10-07 NOTE — Assessment & Plan Note (Signed)
 OSA on CPAP. Excellent compliance and control. Receives benefit from use. Due for a new machine. Order placed today for auto CPAP 10-20 cmH2O, mask of choice and heated humidity. She was also provided with a rx for Mini CPAP that she may purchase for travel. She is aware of risks of untreated OSA. Understands proper care/use of device. Safe driving practices reviewed. Healthy weight loss encouraged. Aware to monitor for mask fit and pressure problems. Can reassess OSA with HST when she is at her goal weight or as needed sooner.   Patient Instructions  Continue to use CPAP every night, minimum of 4-6 hours a night.  Change equipment as directed. Wash your tubing with warm soap and water daily, hang to dry. Wash humidifier portion weekly. Use bottled, distilled water and change daily Be aware of reduced alertness and do not drive or operate heavy machinery if experiencing this or drowsiness.  Exercise encouraged, as tolerated. Healthy weight management discussed.  Avoid or decrease alcohol consumption and medications that make you more sleepy, if possible. Notify if persistent daytime sleepiness occurs even with consistent use of PAP therapy.  Orders placed for new CPAP I also gave you a prescription for a travel CPAP if you want to look into getting one of these You can talk to your insurance about coverage. I do have some patients who use these as their main CPAP as well but I would make sure you like it and feel well controlled with it before making this decision  We discussed how untreated sleep apnea puts an individual at risk for cardiac arrhthymias, pulm HTN, DM, stroke and increases their risk for daytime accidents.   Orders placed for new supplies  Follow up in 10-12 weeks with Katie Charl Wellen,NP, or sooner, if needed

## 2023-10-07 NOTE — Progress Notes (Unsigned)
 @Patient  ID: Karen [name], female    DOB: October 12, 1965, 58 y.o.   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 Past medical history significant for OSA on CPAP, obesity, GERD, DM, HLD.  TEST/EVENTS:   10/07/2023: Today - sleep consult Discussed the use of AI scribe software for clinical note transcription with the patient, who gave verbal consent to proceed.  History of Present Illness   Karen [name] is a 58 year old female with sleep apnea who presents to re-establish care.   She has a history of sleep apnea and uses a CPAP machine nightly. She  has been using her old CPAP machine some due to needing new supplies and not being able to obtain them until she had a follow up visit per her DME company.   She has been using her current CPAP machine since approx. May 2020. She inquires about eligibility for an upgrade. She uses a nasal mask with her CPAP.  She is postmenopausal and experiences sleep disturbances due to hormonal changes, including hot flashes. She takes Atarax for sleep, which helps. She feels like she sleeps better with her CPAP. Energy levels are decent during the day; still has some fatigue with menopause. No residual snoring or drowsy driving. No morning headaches.   Her caffeine intake includes a couple of Diet Cokes a day, and she seldom consumes alcohol. 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 She has lost 65 pounds and wants to lose an additional 65 pounds. Her CPAP pressures feel okay despite the weight loss. No belching, bloating, nausea.   Goes to sleep around 11 pm. Takes a while to fall asleep but hydroxyzine helps. Wakes 1-2 times a night. Gets up around 7-9 am. Retired Charity fundraiser. No oxygen use.   Lives with husband and son.   [place]  6  09/06/2023-10/05/2023: CPAP 10-20 cmH2O 22/30 days; 73% >4 hr; average use 10 hr 21 min Pressure 95th 12 Leaks 95th 30.4 AHI 1.4      Allergies  Allergen Reactions   Sulfa Antibiotics Diarrhea, Nausea And Vomiting and Rash    Immunization History  Administered Date(s) Administered   Influenza Inj Mdck Quad Pf 02/15/2017   Influenza, Seasonal, Injecte, Preservative Fre 02/15/2015, 02/13/2016   Influenza,inj,quad, With Preservative 02/15/2017, 02/13/2018   PFIZER(Purple Top)SARS-COV-2 Vaccination 07/19/2019, 08/19/2019, 02/22/2020   Pfizer Covid-19 Vaccine Bivalent Booster 54yrs & up 03/23/2021   Tdap 03/06/2009    Past Medical History:  Diagnosis Date   Anemia    iron deficiency   Anxiety    Arthritis    general -coccyx area, elbow   Back pain    Complication of anesthesia    Constipation    Diabetes mellitus without complication (HCC)    Diverticulosis    Fatty liver    GERD (gastroesophageal reflux disease)    IBS (irritable bowel syndrome)    Joint pain    Knee pain    Obesity    OCD (obsessive compulsive disorder)    PONV (postoperative nausea and vomiting)    drop in blood pressure(spinal anesthesia)   Sleep apnea    cpap used "automatic"   SOBOE (shortness of breath on exertion)     Tobacco History: Social History   Tobacco Use  Smoking  Status Never   Passive exposure: Past  Smokeless Tobacco Never   Counseling given: Not Answered   Outpatient Medications Prior to Visit  Medication Sig Dispense Refill   CLOMIPRAMINE HCL PO Take 100 mg by mouth daily. 100 mg daily     empagliflozin  (JARDIANCE ) 10 MG TABS tablet Take 1 tablet (10 mg total) by mouth daily before breakfast. 30 tablet 0   fluvoxaMINE (LUVOX) 50 MG tablet Take 50 mg by mouth at bedtime.     hydrOXYzine HCl (ATARAX PO) Take 25 mg by mouth at bedtime. Unknown dosage     metFORMIN (GLUCOPHAGE) 1000 MG tablet Take 1,000 mg by mouth 2 (two) times daily with a meal.     Multiple  Vitamins-Minerals (MULTIVITAMIN) tablet Take 1 tablet by mouth daily.     pantoprazole (PROTONIX) 40 MG tablet Take 40 mg by mouth daily.     Prucalopride Succinate (MOTEGRITY PO) Take by mouth daily at 12 noon. Dosage unknow     rosuvastatin  (CRESTOR ) 10 MG tablet Take 1 tablet (10 mg total) by mouth daily. 30 tablet 1   tirzepatide (MOUNJARO) 15 MG/0.5ML Pen Inject 15 mg into the skin once a week. 6 mL 0   VITAMIN D , CHOLECALCIFEROL, PO Take by mouth. OTC dosage for unknown     No facility-administered medications prior to visit.     Review of Systems:   Constitutional: No  night sweats, fevers, chills, or lassitude. +weight loss, fatigue  HEENT: No headaches, difficulty swallowing, tooth/dental problems, or sore throat. No sneezing, itching, ear ache, nasal congestion, or post nasal drip CV:  No chest pain, orthopnea, PND, swelling in lower extremities, palpitations  Resp: No shortness of breath with exertion or at rest. No cough GI:  +occasional heartburn, indigestion GU: No nocturia Skin: No rash, lesions, ulcerations MSK:  +knee pain (chronic) Neuro: No dizziness or lightheadedness.  Psych: No depression or anxiety. Mood stable.     Physical Exam:  BP 116/84 (BP Location: Left Arm, Patient Position: Sitting, Cuff Size: Normal)   Pulse 75   Ht 5\' 6"  (1.676 m)   Wt 276 lb (125.2 kg)   LMP 07/16/2016 (Approximate)   SpO2 97%   BMI 44.55 kg/m   GEN: Pleasant, interactive, well-appearing; obese; in no acute distress HEENT:  Normocephalic and atraumatic. PERRLA. Sclera white. Nasal turbinates pink, moist and patent bilaterally. No rhinorrhea present. Oropharynx pink and moist, without exudate or edema. No lesions, ulcerations, or postnasal drip. Mallampati III NECK:  Supple w/ fair ROM. Thyroid  symmetrical with no goiter or nodules palpated. No lymphadenopathy.   CV: RRR, no m/r/g, no peripheral edema. Pulses intact, +2 bilaterally. No cyanosis, pallor or clubbing. PULMONARY:   Unlabored, regular breathing. Clear bilaterally A&P w/o wheezes/rales/rhonchi. No accessory muscle use.  GI: BS present and normoactive. Soft, non-tender to palpation. No organomegaly or masses detected MSK: No erythema, warmth or tenderness. Cap refil <2 sec all extrem.  Neuro: A/Ox3. No focal deficits noted.   Skin: Warm, no lesions or rashe Psych: Normal affect and behavior. Judgement and thought content appropriate.     Lab Results:  CBC    Component Value Date/Time   WBC 5.9 02/12/2022 1047   WBC 7.9 12/02/2017 1619   RBC 5.06 02/12/2022 1047   RBC 5.57 (H) 12/02/2017 1619   HGB 13.0 02/12/2022 1047   HCT 40.8 02/12/2022 1047   PLT 220 02/12/2022 1047   MCV 81 02/12/2022 1047   MCH 25.7 (L) 02/12/2022 1047   MCH 25.0 (L)  12/02/2017 1619   MCHC 31.9 02/12/2022 1047   MCHC 31.5 12/02/2017 1619   RDW 14.1 02/12/2022 1047   LYMPHSABS 1.8 02/12/2022 1047   MONOABS 0.6 04/29/2009 1746   EOSABS 0.2 02/12/2022 1047   BASOSABS 0.1 02/12/2022 1047    BMET    Component Value Date/Time   NA 140 02/12/2022 1047   K 4.3 02/12/2022 1047   CL 99 02/12/2022 1047   CO2 25 02/12/2022 1047   GLUCOSE 115 (H) 02/12/2022 1047   GLUCOSE 131 (H) 12/02/2017 1619   BUN 18 02/12/2022 1047   CREATININE 0.72 02/12/2022 1047   CALCIUM  9.8 02/12/2022 1047   GFRNONAA 103 04/13/2019 0000   GFRAA 118 04/13/2019 0000    BNP No results found for: "BNP"   Imaging:  No results found.  Administration History     None           No data to display          No results found for: "NITRICOXIDE"      Assessment & Plan:   OSA on CPAP OSA on CPAP. Excellent compliance and control. Receives benefit from use. Due for a new machine. Order placed today for auto CPAP 10-20 cmH2O, mask of choice and heated humidity. She was also provided with a rx for Mini CPAP that she may purchase for travel. She is aware of risks of untreated OSA. Understands proper care/use of device. Safe driving  practices reviewed. Healthy weight loss encouraged. Aware to monitor for mask fit and pressure problems. Can reassess OSA with HST when she is at her goal weight or as needed sooner.   Patient Instructions  Continue to use CPAP every night, minimum of 4-6 hours a night.  Change equipment as directed. Wash your tubing with warm soap and water daily, hang to dry. Wash humidifier portion weekly. Use bottled, distilled water and change daily Be aware of reduced alertness and do not drive or operate heavy machinery if experiencing this or drowsiness.  Exercise encouraged, as tolerated. Healthy weight management discussed.  Avoid or decrease alcohol consumption and medications that make you more sleepy, if possible. Notify if persistent daytime sleepiness occurs even with consistent use of PAP therapy.  Orders placed for new CPAP I also gave you a prescription for a travel CPAP if you want to look into getting one of these You can talk to your insurance about coverage. I do have some patients who use these as their main CPAP as well but I would make sure you like it and feel well controlled with it before making this decision  We discussed how untreated sleep apnea puts an individual at risk for cardiac arrhthymias, pulm HTN, DM, stroke and increases their risk for daytime accidents.   Orders placed for new supplies  Follow up in 10-12 weeks with Katie Mihcael Ledee,NP, or sooner, if needed    Class 3 severe obesity with serious comorbidity and body mass index (BMI) of 40.0 to 44.9 in adult BMI 44. 65 lb weight loss. Encouraged to continue healthy weight loss measures.   Other fatigue Seems to be postmenopausal/hormonal changes. Well controlled on CPAP. Encouraged to work on exercise. See above   Advised if symptoms do not improve or worsen, to please contact office for sooner follow up or seek emergency care.   I spent 35 minutes of dedicated to the care of this patient on the date of this encounter  to include pre-visit review of records, face-to-face time with the patient  discussing conditions above, post visit ordering of testing, clinical documentation with the electronic health record, making appropriate referrals as documented, and communicating necessary findings to members of the patients care team.  Roetta Clarke, NP 10/07/2023  Pt aware and understands NP's role.

## 2023-10-08 ENCOUNTER — Encounter: Payer: Self-pay | Admitting: Nurse Practitioner

## 2023-10-15 DIAGNOSIS — E119 Type 2 diabetes mellitus without complications: Secondary | ICD-10-CM | POA: Diagnosis not present

## 2023-10-15 DIAGNOSIS — E270 Other adrenocortical overactivity: Secondary | ICD-10-CM | POA: Diagnosis not present

## 2023-10-15 DIAGNOSIS — R634 Abnormal weight loss: Secondary | ICD-10-CM | POA: Diagnosis not present

## 2023-10-15 DIAGNOSIS — D3500 Benign neoplasm of unspecified adrenal gland: Secondary | ICD-10-CM | POA: Diagnosis not present

## 2023-10-16 NOTE — Telephone Encounter (Signed)
 CMN faxed successfully and signed.

## 2023-10-16 NOTE — Telephone Encounter (Signed)
Duplicate received.

## 2023-10-20 ENCOUNTER — Telehealth: Payer: Self-pay | Admitting: Nurse Practitioner

## 2023-10-20 ENCOUNTER — Ambulatory Visit (INDEPENDENT_AMBULATORY_CARE_PROVIDER_SITE_OTHER): Admitting: Family Medicine

## 2023-10-20 ENCOUNTER — Encounter (INDEPENDENT_AMBULATORY_CARE_PROVIDER_SITE_OTHER): Payer: Self-pay | Admitting: Family Medicine

## 2023-10-20 VITALS — BP 98/70 | HR 78 | Temp 98.4°F | Ht 66.0 in | Wt 270.0 lb

## 2023-10-20 DIAGNOSIS — E1169 Type 2 diabetes mellitus with other specified complication: Secondary | ICD-10-CM

## 2023-10-20 DIAGNOSIS — Z6841 Body Mass Index (BMI) 40.0 and over, adult: Secondary | ICD-10-CM | POA: Diagnosis not present

## 2023-10-20 DIAGNOSIS — E66813 Obesity, class 3: Secondary | ICD-10-CM | POA: Diagnosis not present

## 2023-10-20 DIAGNOSIS — E785 Hyperlipidemia, unspecified: Secondary | ICD-10-CM

## 2023-10-20 DIAGNOSIS — Z7985 Long-term (current) use of injectable non-insulin antidiabetic drugs: Secondary | ICD-10-CM

## 2023-10-20 DIAGNOSIS — Z7984 Long term (current) use of oral hypoglycemic drugs: Secondary | ICD-10-CM

## 2023-10-20 MED ORDER — TIRZEPATIDE 15 MG/0.5ML ~~LOC~~ SOAJ: 15.0000 mg | SUBCUTANEOUS | 0 refills | Status: DC

## 2023-10-20 NOTE — Telephone Encounter (Signed)
 CMN signed and faxed successfully

## 2023-10-20 NOTE — Telephone Encounter (Signed)
 CMN received for CPAP on 10/20/23

## 2023-10-20 NOTE — Progress Notes (Signed)
 Karen [name], D.O.  ABFM, ABOM Specializing in Clinical Bariatric Medicine  Office located at: [address] [place], [place]  [address]   Assessment and Plan:   Medications Discontinued During This Encounter  Medication Reason   tirzepatide  (MOUNJARO ) 15 MG/0.5ML Pen Reorder     Meds ordered this encounter  Medications   tirzepatide  (MOUNJARO ) 15 MG/0.5ML Pen    Sig: Inject 15 mg into the skin once a week.    Dispense:  6 mL    Refill:  0    FOR THE DISEASE OF OBESITY:  Class 3 severe obesity with serious comorbidity and body mass index (BMI) of 40.0 to 44.9 in adult Assessment & Plan: Since last office visit on 09/16/2023 patient's  Muscle mass has decreased by 7.8 lb. Fat mass has increased by 7.4 lb. Total body water has decreased by 1.4 lb.  Counseling done on how various foods will affect these numbers and how to maximize success  Total lbs lost to date: 69 lbs Total weight loss percentage to date: 20.35%    Recommended Dietary Goals Karen [name] is currently in the action stage of change. As such, her goal is to continue weight management plan.  She has agreed to: continue current plan   Behavioral Intervention We discussed the following today: celebration eating strategies  Additional resources provided today: None  Evidence-based interventions for health behavior change were utilized today including the discussion of self monitoring techniques, problem-solving barriers and SMART goal setting techniques.   Regarding patient's less desirable eating habits and patterns, we employed the technique of small changes.   Pt will specifically work on: n/a   Recommended Physical Activity Goals Karen [name] has been advised to work up to 300-450 minutes of moderate intensity aerobic activity a week and strengthening exercises 2-3 times per week for cardiovascular health, weight loss maintenance and preservation of muscle mass.   She has agreed to INCREASE her aqua aerobics  to 4 days a week.    Pharmacotherapy We both agreed to continue same regimen.    ASSOCIATED CONDITIONS ADDRESSED TODAY:  Type 2 diabetes mellitus with other specified complication, without long-term current use of insulin  [facility]) Assessment & Plan: DM managed by endocrinology. Denies symptoms of hypoglycemia or hyperglycemia. On Metformin 1,000 mg twice daily, Tirzepatide  15 mg wkly, and Jardiance  10 mg daily with good adherence. States the Tirzepatide  sometimes causes her to have headaches.   Had labs done through [facility] dated 09/17/2023. A1c controlled at 6. Renal parameters were within recommended limits.   Her blood pressure was low today at 98/70. Pt asx. Her BP generally averages 120/80s at home. No concerns.   Continue current medication regimen and journaling plan. Proper hydration may improve headache symptoms. Encouraged her to drink approximately half of her body weight in ounces of water daily. Additionally, have an extra bottle of water for every 30 minutes of exercise.     Hyperlipidemia associated with type 2 diabetes mellitus (HCC) Assessment & Plan: On Rosuvastatin  10 mg daily. Had a lipid panel obtained with [facility] on 09/17/2023. Her LDL significantly improved from 131 to 70.    Continue statin therapy and nutrition plan -decreasing simple carbohydrates, increasing lean proteins, decreasing saturated fats and cholesterol , avoiding trans fats and exercise as able to promote weight loss, improve lipids and decrease cardiovascular risks.    Follow up:   Return 11/18/2023 at 2:40 PM with Ladd Picker, MD. She was informed of the importance of frequent follow up visits  to maximize her success with intensive lifestyle modifications for her multiple health conditions.  Subjective:   Chief complaint: Obesity Karen [name] is here to discuss her progress with her obesity treatment plan. She is journaling 1500 calories daily  and states she is following her eating plan  approximately 80% of the time. She states she is walking in the pool 90 minutes 2-3 days per week.  Interval History:  Karen [name] is here for a follow up office visit. Since last OV on 09/16/2023,  Karen [name] is down 1 lb. Lately, she has been attending multiple celebratory gatherings and endorses eating a lot more carbs. She is ready to get back on track and start journaling more consistently. She had labs on 5/07 which I will review today.   Pharmacotherapy that aid  weight loss: She is currently taking Metformin, Mounjaro , and Jardiance .   Review of Systems:  Pertinent positives were addressed with patient today.  Reviewed by clinician on day of visit: allergies, medications, problem list, medical history, surgical history, family history, social history, and previous encounter notes.  Weight Summary and Biometrics   Weight Lost Since Last Visit: 1lb  Weight Gained Since Last Visit: 0lb   Vitals Temp: 98.4 F (36.9 C) BP: 98/70 Pulse Rate: 78 SpO2: 98 %   Anthropometric Measurements Height: 5' 6 (1.676 m) Weight: 270 lb (122.5 kg) BMI (Calculated): 43.6 Weight at Last Visit: 271lb Weight Lost Since Last Visit: 1lb Weight Gained Since Last Visit: 0lb Starting Weight: 339lb Total Weight Loss (lbs): 69 lb (31.3 kg) Peak Weight: 339lb   Body Composition  Body Fat %: 50.5 % Fat Mass (lbs): 136.4 lbs Muscle Mass (lbs): 127 lbs Total Body Water (lbs): 90.8 lbs Visceral Fat Rating : 17   Other Clinical Data Fasting: No Labs: no Today's Visit #: 25 Starting Date: 02/12/22   Objective:   PHYSICAL EXAM: Blood pressure 98/70, pulse 78, temperature 98.4 F (36.9 C), height 5' 6 (1.676 m), weight 270 lb (122.5 kg), last menstrual period 07/16/2016, SpO2 98%. Body mass index is 43.58 kg/m.  General: she is overweight, cooperative and in no acute distress. PSYCH: Has normal mood, affect and thought process.   HEENT: EOMI, sclerae are anicteric. Lungs: Normal  breathing effort, no conversational dyspnea. Extremities: Moves * 4 Neurologic: A and O * 3, good insight  DIAGNOSTIC DATA REVIEWED: BMET    Component Value Date/Time   NA 140 02/12/2022 1047   K 4.3 02/12/2022 1047   CL 99 02/12/2022 1047   CO2 25 02/12/2022 1047   GLUCOSE 115 (H) 02/12/2022 1047   GLUCOSE 131 (H) 12/02/2017 1619   BUN 18 02/12/2022 1047   CREATININE 0.72 02/12/2022 1047   CALCIUM  9.8 02/12/2022 1047   GFRNONAA 103 04/13/2019 0000   GFRAA 118 04/13/2019 0000   Lab Results  Component Value Date   HGBA1C 6.0 (H) 05/16/2022   HGBA1C 6.3 (H) 04/13/2019   Lab Results  Component Value Date   INSULIN  12.5 02/12/2022   INSULIN  15.1 12/16/2018   Lab Results  Component Value Date   TSH 0.988 02/12/2022   CBC    Component Value Date/Time   WBC 5.9 02/12/2022 1047   WBC 7.9 12/02/2017 1619   RBC 5.06 02/12/2022 1047   RBC 5.57 (H) 12/02/2017 1619   HGB 13.0 02/12/2022 1047   HCT 40.8 02/12/2022 1047   PLT 220 02/12/2022 1047   MCV 81 02/12/2022 1047   MCH 25.7 (L) 02/12/2022 1047   MCH 25.0 (L)  12/02/2017 1619   MCHC 31.9 02/12/2022 1047   MCHC 31.5 12/02/2017 1619   RDW 14.1 02/12/2022 1047   Iron Studies No results found for: IRON, TIBC, FERRITIN, IRONPCTSAT Lipid Panel     Component Value Date/Time   CHOL 149 02/12/2022 1047   TRIG 119 02/12/2022 1047   HDL 52 02/12/2022 1047   CHOLHDL 2.9 02/12/2022 1047   LDLCALC 76 02/12/2022 1047   Hepatic Function Panel     Component Value Date/Time   PROT 6.9 02/12/2022 1047   ALBUMIN 4.4 02/12/2022 1047   AST 17 02/12/2022 1047   ALT 14 02/12/2022 1047   ALKPHOS 83 02/12/2022 1047   BILITOT 0.3 02/12/2022 1047      Component Value Date/Time   TSH 0.988 02/12/2022 1047   Nutritional Lab Results  Component Value Date   VD25OH 35.6 10/21/2022   VD25OH 28.4 (L) 07/08/2022   VD25OH 16.3 (L) 02/12/2022    Attestations:   I, Special Puri, acting as a Stage manager for Marceil Sensor, DO., have compiled all relevant documentation for today's office visit on behalf of Marceil Sensor, DO, while in the presence of [employer], DO.  I have reviewed the above documentation for accuracy and completeness, and I agree with the above. Karen [name], D.O.  The 21st Century Cures Act was signed into law in 2016 which includes the topic of electronic health records.  This provides immediate access to information in MyChart.  This includes consultation notes, operative notes, office notes, lab results and pathology reports.  If you have any questions about what you read please let us  know at your next visit so we can discuss your concerns and take corrective action if need be.  We are right here with you.

## 2023-10-30 DIAGNOSIS — E119 Type 2 diabetes mellitus without complications: Secondary | ICD-10-CM | POA: Diagnosis not present

## 2023-10-30 DIAGNOSIS — R300 Dysuria: Secondary | ICD-10-CM | POA: Diagnosis not present

## 2023-10-30 DIAGNOSIS — M545 Low back pain, unspecified: Secondary | ICD-10-CM | POA: Diagnosis not present

## 2023-11-03 DIAGNOSIS — M79672 Pain in left foot: Secondary | ICD-10-CM | POA: Diagnosis not present

## 2023-11-18 ENCOUNTER — Encounter (INDEPENDENT_AMBULATORY_CARE_PROVIDER_SITE_OTHER): Payer: Self-pay | Admitting: Internal Medicine

## 2023-11-18 ENCOUNTER — Ambulatory Visit (INDEPENDENT_AMBULATORY_CARE_PROVIDER_SITE_OTHER): Admitting: Internal Medicine

## 2023-11-18 DIAGNOSIS — E1169 Type 2 diabetes mellitus with other specified complication: Secondary | ICD-10-CM | POA: Diagnosis not present

## 2023-11-18 DIAGNOSIS — E669 Obesity, unspecified: Secondary | ICD-10-CM

## 2023-11-18 DIAGNOSIS — Z6841 Body Mass Index (BMI) 40.0 and over, adult: Secondary | ICD-10-CM

## 2023-11-18 DIAGNOSIS — G4733 Obstructive sleep apnea (adult) (pediatric): Secondary | ICD-10-CM

## 2023-11-18 DIAGNOSIS — E7800 Pure hypercholesterolemia, unspecified: Secondary | ICD-10-CM | POA: Diagnosis not present

## 2023-11-18 DIAGNOSIS — Z7985 Long-term (current) use of injectable non-insulin antidiabetic drugs: Secondary | ICD-10-CM

## 2023-11-18 DIAGNOSIS — Z7984 Long term (current) use of oral hypoglycemic drugs: Secondary | ICD-10-CM

## 2023-11-18 MED ORDER — TIRZEPATIDE 15 MG/0.5ML ~~LOC~~ SOAJ: 15.0000 mg | SUBCUTANEOUS | 0 refills | Status: DC

## 2023-11-18 NOTE — Progress Notes (Signed)
 Office: [phone]  /  Fax: [phone]  Weight Summary and Body Composition Analysis (BIA)  Vitals Temp: 98 F (36.7 C) BP: 105/72 Pulse Rate: 78 SpO2: 96 %   Anthropometric Measurements Height: 5' 6 (1.676 m) Weight: 265 lb (120.2 kg) BMI (Calculated): 42.79 Weight at Last Visit: 270 lb Weight Lost Since Last Visit: 5 lb Weight Gained Since Last Visit: 0 lb Starting Weight: 339 lb Total Weight Loss (lbs): 74 lb (33.6 kg) Peak Weight: 339 lb   Body Composition  Body Fat %: 45.3 % Fat Mass (lbs): 120.4 lbs Muscle Mass (lbs): 138 lbs Total Body Water (lbs): 91.8 lbs Visceral Fat Rating : 15    RMR: 2794  Today's Visit #: 26  No data recorded  Subjective   Chief Complaint: Obesity  Interval History Discussed the use of AI scribe software for clinical note transcription with the patient, who gave verbal consent to proceed.  History of Present Illness   Karen [name] is a 58 year old female with type 2 diabetes, sleep apnea, hypertension, and hypercholesterolemia who presents for medical weight management.  She is currently on Mounjaro  15 mg daily, an SGLT2 inhibitor, and metformin 1000 mg twice daily. Since her last office visit, she has lost five pounds.  She adheres to a 1500 calorie diet plan approximately 70-80% of the time, tracks her caloric intake, consumes more whole foods, ensures adequate protein intake, and maintains hydration. No skipped meals. She engages in physical activity three days a week for 90 minutes, focusing on cardiovascular exercises.  She reports a reduction in body fat percentage from 50% to 45% and an increase in muscle mass. Her visceral fat rating has also decreased.  She has been on Mounjaro  since December 2024, approximately seven months, and has lost 22% of her total body weight.  Prednisone, previously prescribed for back pain, increases her appetite and affects her diabetes control, causing her blood sugar to spike.  She has avoided using prednisone recently to prevent these effects.  Her last A1c was 6.0, and she is pleased with this result. She plans to have another check in six weeks.       Challenges affecting patient progress: none, strong hunger signals and/or impaired satiety / inhibitory control, medical comorbidities, presence of obesogenic drugs, and menopause.    Pharmacotherapy for weight management: She is currently taking Metformin with diabetes as primary indication with adequate clinical response  and without side effects., Monjauro with diabetes as the primary indication with adequate clinical response  and without side effects., and Jardiance .   Assessment and Plan   Treatment Plan For Obesity:  Recommended Dietary Goals  Eden is currently in the action stage of change. As such, her goal is to continue weight management plan. She has agreed to: continue current plan  Behavioral Health and Counseling  We discussed the following behavioral modification strategies today: continue to work on maintaining a reduced calorie state, getting the recommended amount of protein, incorporating whole foods, making healthy choices, staying well hydrated and practicing mindfulness when eating..  Additional education and resources provided today: None  Recommended Physical Activity Goals  Aidyn has been advised to work up to 150 minutes of moderate intensity aerobic activity a week and strengthening exercises 2-3 times per week for cardiovascular health, weight loss maintenance and preservation of muscle mass.   She has agreed to :  Think about enjoyable ways to increase daily physical activity and overcoming barriers to exercise and Increase physical activity in  their day and reduce sedentary time (increase NEAT).  Medical Interventions and Pharmacotherapy  We discussed various medication options to help Natina with her weight loss efforts and we both agreed to : Adequate clinical response to  anti-obesity medication, continue current regimen  Associated Conditions Impacted by Obesity Treatment  Assessment & Plan Type 2 diabetes mellitus with other specified complication, without long-term current use of insulin  (HCC)     Assessment and Plan    Obesity Undergoing medical weight management with significant progress. Lost 5 pounds since last visit and achieved a 22% total body weight loss, surpassing the expected 15% for patients with diabetes on Mounjaro . Body composition improved with decreased body fat and visceral fat. Muscle mass maintained with less than 15% loss, compared to the typical 25% during dieting. Mounjaro  expected to continue aiding weight loss up to 18 months, with potential plateau thereafter. Discussed future consideration of gastric bypass if further weight loss is desired, noting gastric bypass causes remission of diabetes in 45% of cases and results in 30% weight loss. - Continue Mounjaro  15 mg daily - Send Mounjaro  prescription to [facility] - Encourage adherence to 1500 calorie diet plan - Encourage regular exercise and protein intake - Discuss potential future consideration of gastric bypass if needed  Type 2 Diabetes Mellitus Type 2 diabetes managed with metformin and an SGLT2 inhibitor. Last A1c was 6.0, indicating good glycemic control. Explained insulin  resistance and carbohydrate impact on blood sugar. Advised to maintain low carbohydrate diet to prevent insulin  spikes and weight gain. Discussed potential for diabetes remission with significant weight loss or gastric bypass, though likely to remain diabetic due to chronic nature of condition. Mounjaro 's effect on weight loss is blunted in diabetes, typically achieving 15% weight loss, but she has achieved 22%. - Continue metformin and SGLT2 inhibitor - Monitor blood sugar levels - Maintain low carbohydrate diet  Hypertension Hypertension is a comorbid condition associated with obesity and type 2  diabetes.  Hypercholesterolemia Hypercholesterolemia is a comorbid condition associated with obesity and type 2 diabetes.  Obstructive Sleep Apnea Obstructive sleep apnea is a comorbid condition associated with obesity.  General Health Maintenance Emphasized importance of balanced diet, regular exercise, and adequate hydration. Encouraged to continue current lifestyle modifications to support weight management and overall health. Advised to avoid highly palatable foods that may lead to overeating. - Encourage balanced diet with adequate protein, fiber, and water intake - Encourage regular physical activity - Avoid highly palatable foods that may lead to overeating  Follow-up Plan to follow up in about a month to monitor progress with weight management and diabetes control. - Schedule follow-up appointment in one month        Objective   Physical Exam:  Blood pressure 105/72, pulse 78, temperature 98 F (36.7 C), height 5' 6 (1.676 m), weight 265 lb (120.2 kg), last menstrual period 07/16/2016, SpO2 96%. Body mass index is 42.77 kg/m.  General: She is overweight, cooperative, alert, well developed, and in no acute distress. PSYCH: Has normal mood, affect and thought process.   HEENT: EOMI, sclerae are anicteric. Lungs: Normal breathing effort, no conversational dyspnea. Extremities: No edema.  Neurologic: No gross sensory or motor deficits. No tremors or fasciculations noted.    Diagnostic Data Reviewed:  BMET    Component Value Date/Time   NA 140 02/12/2022 1047   K 4.3 02/12/2022 1047   CL 99 02/12/2022 1047   CO2 25 02/12/2022 1047   GLUCOSE 115 (H) 02/12/2022 1047   GLUCOSE 131 (H)  12/02/2017 1619   BUN 18 02/12/2022 1047   CREATININE 0.72 02/12/2022 1047   CALCIUM  9.8 02/12/2022 1047   GFRNONAA 103 04/13/2019 0000   GFRAA 118 04/13/2019 0000   Lab Results  Component Value Date   HGBA1C 6.0 (H) 05/16/2022   HGBA1C 6.3 (H) 04/13/2019   Lab Results   Component Value Date   INSULIN  12.5 02/12/2022   INSULIN  15.1 12/16/2018   Lab Results  Component Value Date   TSH 0.988 02/12/2022   CBC    Component Value Date/Time   WBC 5.9 02/12/2022 1047   WBC 7.9 12/02/2017 1619   RBC 5.06 02/12/2022 1047   RBC 5.57 (H) 12/02/2017 1619   HGB 13.0 02/12/2022 1047   HCT 40.8 02/12/2022 1047   PLT 220 02/12/2022 1047   MCV 81 02/12/2022 1047   MCH 25.7 (L) 02/12/2022 1047   MCH 25.0 (L) 12/02/2017 1619   MCHC 31.9 02/12/2022 1047   MCHC 31.5 12/02/2017 1619   RDW 14.1 02/12/2022 1047   Iron Studies No results found for: IRON, TIBC, FERRITIN, IRONPCTSAT Lipid Panel     Component Value Date/Time   CHOL 149 02/12/2022 1047   TRIG 119 02/12/2022 1047   HDL 52 02/12/2022 1047   CHOLHDL 2.9 02/12/2022 1047   LDLCALC 76 02/12/2022 1047   Hepatic Function Panel     Component Value Date/Time   PROT 6.9 02/12/2022 1047   ALBUMIN 4.4 02/12/2022 1047   AST 17 02/12/2022 1047   ALT 14 02/12/2022 1047   ALKPHOS 83 02/12/2022 1047   BILITOT 0.3 02/12/2022 1047      Component Value Date/Time   TSH 0.988 02/12/2022 1047   Nutritional Lab Results  Component Value Date   VD25OH 35.6 10/21/2022   VD25OH 28.4 (L) 07/08/2022   VD25OH 16.3 (L) 02/12/2022    Medications: Outpatient Encounter Medications as of 11/18/2023  Medication Sig   CLOMIPRAMINE HCL PO Take 100 mg by mouth daily. 100 mg daily   empagliflozin  (JARDIANCE ) 10 MG TABS tablet Take 1 tablet (10 mg total) by mouth daily before breakfast.   fluvoxaMINE (LUVOX) 50 MG tablet Take 50 mg by mouth at bedtime.   hydrOXYzine HCl (ATARAX PO) Take 25 mg by mouth at bedtime. Unknown dosage   metFORMIN (GLUCOPHAGE) 1000 MG tablet Take 1,000 mg by mouth 2 (two) times daily with a meal.   Multiple Vitamins-Minerals (MULTIVITAMIN) tablet Take 1 tablet by mouth daily.   pantoprazole (PROTONIX) 40 MG tablet Take 40 mg by mouth daily.   Prucalopride Succinate (MOTEGRITY PO) Take  by mouth daily at 12 noon. Dosage unknow   rosuvastatin  (CRESTOR ) 10 MG tablet Take 1 tablet (10 mg total) by mouth daily.   tirzepatide  (MOUNJARO ) 15 MG/0.5ML Pen Inject 15 mg into the skin once a week.   VITAMIN D , CHOLECALCIFEROL, PO Take by mouth. OTC dosage for unknown   No facility-administered encounter medications on file as of 11/18/2023.     Follow-Up   No follow-ups on file.SABRA She was informed of the importance of frequent follow up visits to maximize her success with intensive lifestyle modifications for her multiple health conditions.  Attestation Statement   Reviewed by clinician on day of visit: allergies, medications, problem list, medical history, surgical history, family history, social history, and previous encounter notes.     Lucas Parker, MD

## 2023-11-28 DIAGNOSIS — G4733 Obstructive sleep apnea (adult) (pediatric): Secondary | ICD-10-CM | POA: Diagnosis not present

## 2023-12-15 DIAGNOSIS — E1165 Type 2 diabetes mellitus with hyperglycemia: Secondary | ICD-10-CM | POA: Diagnosis not present

## 2023-12-17 ENCOUNTER — Ambulatory Visit (INDEPENDENT_AMBULATORY_CARE_PROVIDER_SITE_OTHER): Admitting: Internal Medicine

## 2023-12-17 ENCOUNTER — Other Ambulatory Visit (INDEPENDENT_AMBULATORY_CARE_PROVIDER_SITE_OTHER): Payer: Self-pay | Admitting: Internal Medicine

## 2023-12-17 ENCOUNTER — Encounter (INDEPENDENT_AMBULATORY_CARE_PROVIDER_SITE_OTHER): Payer: Self-pay | Admitting: Internal Medicine

## 2023-12-17 VITALS — BP 102/70 | HR 90 | Temp 98.4°F | Ht 66.0 in | Wt 263.0 lb

## 2023-12-17 DIAGNOSIS — E66813 Obesity, class 3: Secondary | ICD-10-CM

## 2023-12-17 DIAGNOSIS — G4733 Obstructive sleep apnea (adult) (pediatric): Secondary | ICD-10-CM

## 2023-12-17 DIAGNOSIS — R519 Headache, unspecified: Secondary | ICD-10-CM

## 2023-12-17 DIAGNOSIS — Z7984 Long term (current) use of oral hypoglycemic drugs: Secondary | ICD-10-CM

## 2023-12-17 DIAGNOSIS — Z6841 Body Mass Index (BMI) 40.0 and over, adult: Secondary | ICD-10-CM

## 2023-12-17 DIAGNOSIS — E119 Type 2 diabetes mellitus without complications: Secondary | ICD-10-CM | POA: Diagnosis not present

## 2023-12-17 DIAGNOSIS — L561 Drug photoallergic response: Secondary | ICD-10-CM

## 2023-12-17 DIAGNOSIS — R300 Dysuria: Secondary | ICD-10-CM

## 2023-12-17 DIAGNOSIS — T50905A Adverse effect of unspecified drugs, medicaments and biological substances, initial encounter: Secondary | ICD-10-CM

## 2023-12-17 NOTE — Assessment & Plan Note (Signed)
 She reports urinary symptoms including urgency, frequency, and pelvic pressure, suggesting a possible UTI. Jardiance  use increases the risk of UTIs. Differential diagnosis includes vaginal atrophy due to menopause. - Perform urine test and culture if indicated - Advise on proper urine collection technique - Consider gynecological evaluation if symptoms persist without evidence of infection

## 2023-12-17 NOTE — Assessment & Plan Note (Signed)
 Thus far she has lost 76 pounds or 23% of total body weight on medically supervised weight management plan inclusive of GLP-1.[name] is on tirzepatide  15 mg once a week for weight management. She has lost 2 pounds since the last visit, bringing her total weight loss to 76 pounds. She has resumed her routine after a vacation and is focusing on nutrition and physical activity. She is trying to increase her physical activity to 4-5 days a week and is mindful of her protein intake and carbohydrate reduction. - Continue tirzepatide  15 mg once a week - Encourage maintaining physical activity 4-5 days a week - Advise on balanced diet with focus on protein intake and carbohydrate reduction

## 2023-12-17 NOTE — Assessment & Plan Note (Signed)
 On CPAP with reported good compliance. Continue PAP therapy.  Consider repeating sleep study as she has lost approximately 23% of total body weight therefore possibly reducing AHI.  Continue current weight management strategy inclusive of GLP-1

## 2023-12-17 NOTE — Progress Notes (Signed)
 Office: [phone]  /  Fax: [phone]  Weight Summary and Body Composition Analysis (BIA)  Vitals Temp: 98.4 F (36.9 C) BP: 102/70 Pulse Rate: 90 SpO2: 97 %   Anthropometric Measurements Height: 5' 6 (1.676 m) Weight: 263 lb (119.3 kg) BMI (Calculated): 42.47 Weight at Last Visit: 265 Weight Lost Since Last Visit: 2 lb Weight Gained Since Last Visit: 0 lb Starting Weight: 339 lb Total Weight Loss (lbs): 76 lb (34.5 kg) Peak Weight: 339 lb   Body Composition  Body Fat %: 45 % Fat Mass (lbs): 118.8 lbs Muscle Mass (lbs): 137.8 lbs Total Body Water (lbs): 91.2 lbs Visceral Fat Rating : 15    RMR: 2794  Today's Visit #: 27  Starting Date: 02/12/22   Subjective   Chief Complaint: Obesity  Interval History Discussed the use of AI scribe software for clinical note transcription with the patient, who gave verbal consent to proceed.  History of Present Illness   Karen [name] is a 58 year old female with type two diabetes and hypercholesterolemia who presents for medical weight management.  She is on tirzepatide  15 mg once a week for weight management and has lost two pounds since her last visit, totaling a 76-pound weight loss. During a recent two and a half week vacation, she maintained her weight despite frequent dining out. Upon returning home, she resumed her routine, including swimming and maintaining her protein intake.  She has type two diabetes managed with metformin and Jardiance . Her recent A1c is 5.4, improved from 6.0 and 6.9 previously. She aims to exercise four to five times a week.  She experiences headaches described as 'annoying' and located behind her eyes, relieved by Tylenol, and not severe enough to impair functioning. She suspects dehydration or a urinary tract infection as potential causes, given urinary urgency, frequency, and new pelvic pressure without dysuria.  She has sleep apnea and uses a CPAP machine regularly. She also has  acid reflux managed with Protonix, though she notes that the medication sometimes increases her feeling of fullness, exacerbating reflux symptoms.  She has noticed skin peeling and redness after sun exposure, which she attributes to medication-induced photosensitivity. She uses a moisturizer with sun protection to manage these symptoms.        Challenges affecting patient progress: none.    Pharmacotherapy for weight management: She is currently taking Metformin with diabetes as primary indication with adequate clinical response  and without side effects., Monjauro with diabetes as the primary indication with adequate clinical response  and without side effects., and Jardiance .   Assessment and Plan   Treatment Plan For Obesity:  Recommended Dietary Goals  Karen [name] is currently in the action stage of change. As such, her goal is to continue weight management plan. She has agreed to: continue current plan  Behavioral Health and Counseling  We discussed the following behavioral modification strategies today: continue to work on maintaining a reduced calorie state, getting the recommended amount of protein, incorporating whole foods, making healthy choices, staying well hydrated and practicing mindfulness when eating..  Additional education and resources provided today: None  Recommended Physical Activity Goals  Karen [name] has been advised to work up to 150 minutes of moderate intensity aerobic activity a week and strengthening exercises 2-3 times per week for cardiovascular health, weight loss maintenance and preservation of muscle mass.   She has agreed to :  Think about enjoyable ways to increase daily physical activity and overcoming barriers to exercise and Increase physical activity in  their day and reduce sedentary time (increase NEAT).  Medical Interventions and Pharmacotherapy  We discussed various medication options to help Karen [name] with her weight loss efforts and we both agreed to :  Adequate clinical response to anti-obesity medication, continue current regimen  Associated Conditions Impacted by Obesity Treatment  Assessment & Plan Dysuria She reports urinary symptoms including urgency, frequency, and pelvic pressure, suggesting a possible UTI. Jardiance  use increases the risk of UTIs. Differential diagnosis includes vaginal atrophy due to menopause. - Perform urine test and culture if indicated - Advise on proper urine collection technique - Consider gynecological evaluation if symptoms persist without evidence of infection OSA on CPAP On CPAP with reported good compliance. Continue PAP therapy.  Consider repeating sleep study as she has lost approximately 23% of total body weight therefore possibly reducing AHI.  Continue current weight management strategy inclusive of GLP-1  Type 2 diabetes mellitus without complication, without long-term current use of insulin  (HCC) A1c has improved to 5.4 from 6.0, indicating good glycemic control. She is on metformin and Jardiance . The improvement is attributed to weight loss, increased physical activity, and dietary changes. Discussed the role of insulin  resistance and the importance of reducing carbohydrate intake to prevent pancreatic burnout. Ninety percent of type 2 diabetics historically ended up on insulin  due to progressive pancreatic burnout, but her lifestyle changes have altered this trajectory. - Continue metformin and Jardiance  - Consider fasting insulin  test to assess insulin  resistance - Encourage continued dietary management and physical activity Class 3 severe obesity with serious comorbidity and body mass index (BMI) of 40.0 to 44.9 in adult Thus far she has lost 76 pounds or 23% of total body weight on medically supervised weight management plan inclusive of GLP-1.Karen [name] is on tirzepatide  15 mg once a week for weight management. She has lost 2 pounds since the last visit, bringing her total weight loss to 76 pounds. She  has resumed her routine after a vacation and is focusing on nutrition and physical activity. She is trying to increase her physical activity to 4-5 days a week and is mindful of her protein intake and carbohydrate reduction. - Continue tirzepatide  15 mg once a week - Encourage maintaining physical activity 4-5 days a week - Advise on balanced diet with focus on protein intake and carbohydrate reduction  Headache, unspecified She reports headaches that are not severe but annoying, located behind the eyes. Possible causes include sinus issues, allergies, dehydration, or medication side effects. Advised to increase hydration, especially due to Jardiance  use, which can cause dehydration. - Increase fluid intake to about 90 ounces daily - Monitor headache frequency and severity - Consider ruling out medication-related headaches by temporarily discontinuing medications one at a time if headaches persist   Photosensitivity due to medication She experienced sunburn and peeling, likely due to medication-induced photosensitivity. Advised on sun protection and skin care. - Use moisturizer with sunblock or Vanicream with UPF for skin protection - Advise on sun protection measures, including wearing hats and sunglasses        Objective   Physical Exam:  Blood pressure 102/70, pulse 90, temperature 98.4 F (36.9 C), height 5' 6 (1.676 m), weight 263 lb (119.3 kg), last menstrual period 07/16/2016, SpO2 97%. Body mass index is 42.45 kg/m.  General: She is overweight, cooperative, alert, well developed, and in no acute distress. PSYCH: Has normal mood, affect and thought process.   HEENT: EOMI, sclerae are anicteric. Lungs: Normal breathing effort, no conversational dyspnea. Extremities: No edema.  Neurologic: No  gross sensory or motor deficits. No tremors or fasciculations noted.    Diagnostic Data Reviewed:  BMET    Component Value Date/Time   NA 140 02/12/2022 1047   K 4.3 02/12/2022  1047   CL 99 02/12/2022 1047   CO2 25 02/12/2022 1047   GLUCOSE 115 (H) 02/12/2022 1047   GLUCOSE 131 (H) 12/02/2017 1619   BUN 18 02/12/2022 1047   CREATININE 0.72 02/12/2022 1047   CALCIUM  9.8 02/12/2022 1047   GFRNONAA 103 04/13/2019 0000   GFRAA 118 04/13/2019 0000   Lab Results  Component Value Date   HGBA1C 6.0 (H) 05/16/2022   HGBA1C 6.3 (H) 04/13/2019   Lab Results  Component Value Date   INSULIN  12.5 02/12/2022   INSULIN  15.1 12/16/2018   Lab Results  Component Value Date   TSH 0.988 02/12/2022   CBC    Component Value Date/Time   WBC 5.9 02/12/2022 1047   WBC 7.9 12/02/2017 1619   RBC 5.06 02/12/2022 1047   RBC 5.57 (H) 12/02/2017 1619   HGB 13.0 02/12/2022 1047   HCT 40.8 02/12/2022 1047   PLT 220 02/12/2022 1047   MCV 81 02/12/2022 1047   MCH 25.7 (L) 02/12/2022 1047   MCH 25.0 (L) 12/02/2017 1619   MCHC 31.9 02/12/2022 1047   MCHC 31.5 12/02/2017 1619   RDW 14.1 02/12/2022 1047   Iron Studies No results found for: IRON, TIBC, FERRITIN, IRONPCTSAT Lipid Panel     Component Value Date/Time   CHOL 149 02/12/2022 1047   TRIG 119 02/12/2022 1047   HDL 52 02/12/2022 1047   CHOLHDL 2.9 02/12/2022 1047   LDLCALC 76 02/12/2022 1047   Hepatic Function Panel     Component Value Date/Time   PROT 6.9 02/12/2022 1047   ALBUMIN 4.4 02/12/2022 1047   AST 17 02/12/2022 1047   ALT 14 02/12/2022 1047   ALKPHOS 83 02/12/2022 1047   BILITOT 0.3 02/12/2022 1047      Component Value Date/Time   TSH 0.988 02/12/2022 1047   Nutritional Lab Results  Component Value Date   VD25OH 35.6 10/21/2022   VD25OH 28.4 (L) 07/08/2022   VD25OH 16.3 (L) 02/12/2022    Medications: Outpatient Encounter Medications as of 12/17/2023  Medication Sig   CLOMIPRAMINE HCL PO Take 100 mg by mouth daily. 100 mg daily   empagliflozin  (JARDIANCE ) 10 MG TABS tablet Take 1 tablet (10 mg total) by mouth daily before breakfast.   fluvoxaMINE (LUVOX) 50 MG tablet Take 50 mg  by mouth at bedtime.   hydrOXYzine HCl (ATARAX PO) Take 25 mg by mouth at bedtime. Unknown dosage   metFORMIN (GLUCOPHAGE) 1000 MG tablet Take 1,000 mg by mouth 2 (two) times daily with a meal.   Multiple Vitamins-Minerals (MULTIVITAMIN) tablet Take 1 tablet by mouth daily.   pantoprazole (PROTONIX) 40 MG tablet Take 40 mg by mouth daily.   Prucalopride Succinate (MOTEGRITY PO) Take by mouth daily at 12 noon. Dosage unknow   rosuvastatin  (CRESTOR ) 10 MG tablet Take 1 tablet (10 mg total) by mouth daily.   tirzepatide  (MOUNJARO ) 15 MG/0.5ML Pen Inject 15 mg into the skin once a week.   VITAMIN D , CHOLECALCIFEROL, PO Take by mouth. OTC dosage for unknown   No facility-administered encounter medications on file as of 12/17/2023.     Follow-Up   Return in about 4 weeks (around 01/14/2024) for For Weight Mangement with Dr. Francyne.SABRA She was informed of the importance of frequent follow up visits to maximize her success  with intensive lifestyle modifications for her multiple health conditions.  Attestation Statement   Reviewed by clinician on day of visit: allergies, medications, problem list, medical history, surgical history, family history, social history, and previous encounter notes.     Lucas Parker, MD

## 2023-12-17 NOTE — Assessment & Plan Note (Signed)
 A1c has improved to 5.4 from 6.0, indicating good glycemic control. She is on metformin and Jardiance . The improvement is attributed to weight loss, increased physical activity, and dietary changes. Discussed the role of insulin  resistance and the importance of reducing carbohydrate intake to prevent pancreatic burnout. Ninety percent of type 2 diabetics historically ended up on insulin  due to progressive pancreatic burnout, but her lifestyle changes have altered this trajectory. - Continue metformin and Jardiance  - Consider fasting insulin  test to assess insulin  resistance - Encourage continued dietary management and physical activity

## 2023-12-20 LAB — UA/M W/RFLX CULTURE, ROUTINE
Bilirubin, UA: NEGATIVE
Nitrite, UA: NEGATIVE
Specific Gravity, UA: 1.02 (ref 1.005–1.030)
Urobilinogen, Ur: 1 mg/dL (ref 0.2–1.0)
pH, UA: 6 (ref 5.0–7.5)

## 2023-12-20 LAB — URINE CULTURE, REFLEX

## 2023-12-20 LAB — MICROSCOPIC EXAMINATION
Casts: NONE SEEN /LPF
Epithelial Cells (non renal): >10 /HPF — AB (ref 0–10)
RBC, Urine: >30 /HPF — AB (ref 0–2)
WBC, UA: >30 /HPF — AB (ref 0–5)

## 2023-12-21 ENCOUNTER — Encounter (HOSPITAL_COMMUNITY): Payer: Self-pay

## 2023-12-21 ENCOUNTER — Ambulatory Visit (HOSPITAL_COMMUNITY)
Admission: EM | Admit: 2023-12-21 | Discharge: 2023-12-21 | Disposition: A | Discharge status: Home / Self Care | Attending: Nurse Practitioner | Admitting: Nurse Practitioner

## 2023-12-21 DIAGNOSIS — R300 Dysuria: Secondary | ICD-10-CM | POA: Insufficient documentation

## 2023-12-21 DIAGNOSIS — N3001 Acute cystitis with hematuria: Secondary | ICD-10-CM | POA: Insufficient documentation

## 2023-12-21 LAB — POCT URINALYSIS DIP (MANUAL ENTRY)
Bilirubin, UA: NEGATIVE
Glucose, UA: NEGATIVE mg/dL
Ketones, POC UA: NEGATIVE mg/dL
Nitrite, UA: POSITIVE — AB
Protein Ur, POC: 30 mg/dL — AB
Spec Grav, UA: 1.015 (ref 1.010–1.025)
Urobilinogen, UA: 0.2 U/dL
pH, UA: 6 (ref 5.0–8.0)

## 2023-12-21 LAB — POCT URINE PREGNANCY: Preg Test, Ur: NEGATIVE

## 2023-12-21 MED ORDER — ONDANSETRON 8 MG PO TBDP: 8.0000 mg | ORAL_TABLET | Freq: Three times a day (TID) | ORAL | 0 refills | Status: DC | PRN

## 2023-12-21 MED ORDER — PHENAZOPYRIDINE HCL 200 MG PO TABS: 200.0000 mg | ORAL_TABLET | ORAL | 0 refills | Status: AC

## 2023-12-21 MED ORDER — NITROFURANTOIN MONOHYD MACRO 100 MG PO CAPS: 100.0000 mg | ORAL_CAPSULE | Freq: Two times a day (BID) | ORAL | 0 refills | Status: DC

## 2023-12-21 MED ORDER — ONDANSETRON 4 MG PO TBDP
8.0000 mg | ORAL_TABLET | Freq: Once | ORAL | Status: AC
  Administered 2023-12-21: 8 mg via ORAL

## 2023-12-21 MED ORDER — KETOROLAC TROMETHAMINE 60 MG/2ML IM SOLN
INTRAMUSCULAR | Status: AC
  Filled 2023-12-21: qty 2

## 2023-12-21 MED ORDER — KETOROLAC TROMETHAMINE 60 MG/2ML IM SOLN
60.0000 mg | Freq: Once | INTRAMUSCULAR | Status: AC
  Administered 2023-12-21: 60 mg via INTRAMUSCULAR

## 2023-12-21 MED ORDER — ONDANSETRON 4 MG PO TBDP
ORAL_TABLET | ORAL | Status: AC
  Filled 2023-12-21: qty 1

## 2023-12-21 MED ORDER — ONDANSETRON 4 MG PO TBDP
ORAL_TABLET | ORAL | Status: AC
Start: 2023-12-21 — End: 2023-12-21
  Filled 2023-12-21: qty 1

## 2023-12-21 NOTE — ED Notes (Signed)
 Patient states that she had a urinalysis and culture on 12/17/23 and states she has not heard from her physician yet. Patient has lower abdominal pain, urinary frequency, chills, and dysuria x 5 days.  Patient reports that she is needing antibiotics for her UTI.

## 2023-12-21 NOTE — Discharge Instructions (Addendum)
 You were seen today for symptoms of a urinary tract infection, including burning with urination, frequent urination, nausea, lower abdominal pain, and chills. Your urine test today showed evidence of infection, and a urine culture has been sent to confirm the bacteria causing it. You were prescribed nitrofurantoin  to take twice daily for 5 days--finish all doses even if you feel better before the medication is gone. You were also prescribed phenazopyridine  (Pyridium ) to help with urinary discomfort; this may turn your urine an orange or reddish color, which is normal and harmless. In the clinic, you received an injection of Toradol  for pain relief and Zofran  for nausea. Drink plenty of fluids to help flush bacteria from your urinary tract, and empty your bladder regularly. Avoid alcohol while taking antibiotics, and avoid caffeine if it worsens bladder discomfort.  Follow up with your primary care provider if your symptoms do not improve within 48-72 hours or if you have recurrent infections. Seek immediate medical care if you develop fever, flank or back pain, worsening abdominal pain, vomiting, inability to urinate, blood in your urine, or any sudden change in your condition.

## 2023-12-21 NOTE — ED Provider Notes (Signed)
 [facility]    CSN: [id number] Arrival date & time: 12/21/23  1721      History   Chief Complaint Chief Complaint  Patient presents with   Dysuria   Urinary Frequency   Abdominal Pain    HPI Karen [name] is a 58 y.o. female.   Discussed the use of AI scribe software for clinical note transcription with the patient, who gave verbal consent to proceed.   Patient presents with symptoms of a urinary tract infection. The patient initially had a urine test on August 6th, which showed some abnormalities. Today, around 4:30 PM, the patient began experiencing nausea, lower abdominal pain, and chills, prompting concern about possible pyelonephritis.  She denies any fevers, flank pain, low back pain or vomiting.  The following portions of the patient's history were reviewed and updated as appropriate: allergies, current medications, past family history, past medical history, past social history, past surgical history, and problem list.     Past Medical History:  Diagnosis Date   Anemia    iron deficiency   Anxiety    Arthritis    general -coccyx area, elbow   Back pain    Complication of anesthesia    Constipation    Diabetes mellitus without complication (HCC)    Diverticulosis    Fatty liver    GERD (gastroesophageal reflux disease)    IBS (irritable bowel syndrome)    Joint pain    Knee pain    Obesity    OCD (obsessive compulsive disorder)    PONV (postoperative nausea and vomiting)    drop in blood pressure(spinal anesthesia)   Sleep apnea    cpap used automatic   SOBOE (shortness of breath on exertion)     Patient Active Problem List   Diagnosis Date Noted   Dysuria 12/17/2023   Class 3 severe obesity with serious comorbidity and body mass index (BMI) of 40.0 to 44.9 in adult 09/16/2023   Sleep difficulties 08/12/2023   Abnormal food appetite 04/24/2023   Acute bilateral low back pain without sciatica 02/13/2023   Pure hypercholesterolemia  09/23/2022   Angiomyolipoma 07/18/2022   Abnormal cortisol level 07/11/2022   Mood disorder (HCC)-unspecified 02/26/2022   Other fatigue 02/25/2022   Diabetes mellitus (HCC) 02/25/2022   OSA on CPAP 02/25/2022   Binge eating disorder 02/25/2022   Vitamin D  deficiency 03/18/2019   Type 2 diabetes mellitus without complication, without long-term current use of insulin  (HCC) 03/18/2019   GERD (gastroesophageal reflux disease) 05/03/2018    Past Surgical History:  Procedure Laterality Date   ANAL FISSURE REPAIR     '07   BREAST REDUCTION SURGERY     '88   CESAREAN SECTION     x2   COLONOSCOPY WITH PROPOFOL  N/A 02/23/2015   Procedure: COLONOSCOPY WITH PROPOFOL ;  Surgeon: Renaye Sous, MD;  Location: [facility];  Service: Endoscopy;  Laterality: N/A;   ESOPHAGOGASTRODUODENOSCOPY (EGD) WITH PROPOFOL  N/A 07/03/2017   Procedure: ESOPHAGOGASTRODUODENOSCOPY (EGD) WITH PROPOFOL ;  Surgeon: Sous Renaye, MD;  Location: [facility];  Service: Endoscopy;  Laterality: N/A;   GALLBLADDER SURGERY  08/2017   KNEE ARTHROSCOPY W/ ACL RECONSTRUCTION Right    remains with a ? tear.   REDUCTION MAMMAPLASTY Bilateral 1988   TONSILLECTOMY AND ADENOIDECTOMY     Adenoids only    OB History     Gravida  2   Para  2   Term      Preterm      AB  Living         SAB      IAB      Ectopic      Multiple      Live Births               Home Medications    Prior to Admission medications   Medication Sig Start Date End Date Taking? Authorizing Provider  nitrofurantoin , macrocrystal-monohydrate, (MACROBID ) 100 MG capsule Take 1 capsule (100 mg total) by mouth 2 (two) times daily. 12/21/23  Yes Iola Lukes, FNP  ondansetron  (ZOFRAN -ODT) 8 MG disintegrating tablet Take 1 tablet (8 mg total) by mouth every 8 (eight) hours as needed for nausea or vomiting. 12/21/23  Yes Iola Lukes, FNP  phenazopyridine  (PYRIDIUM ) 200 MG tablet Take 1 tablet (200 mg total) by mouth 3  (three) times daily at 8am, 3pm and bedtime for 2 days. 12/21/23 12/23/23 Yes Iola Lukes, FNP  CLOMIPRAMINE HCL PO Take 100 mg by mouth daily. 100 mg daily    [provider]  empagliflozin  (JARDIANCE ) 10 MG TABS tablet Take 1 tablet (10 mg total) by mouth daily before breakfast. 11/19/22   Francyne Romano, MD  fluvoxaMINE (LUVOX) 50 MG tablet Take 50 mg by mouth at bedtime.    [provider]  hydrOXYzine HCl (ATARAX PO) Take 25 mg by mouth at bedtime. Unknown dosage    [provider]  metFORMIN (GLUCOPHAGE) 1000 MG tablet Take 1,000 mg by mouth 2 (two) times daily with a meal.    [provider]  Multiple Vitamins-Minerals (MULTIVITAMIN) tablet Take 1 tablet by mouth daily. 05/15/23   Francyne Romano, MD  pantoprazole (PROTONIX) 40 MG tablet Take 40 mg by mouth daily. 05/15/23   [provider]  Prucalopride Succinate (MOTEGRITY PO) Take by mouth daily at 12 noon. Dosage unknow    [provider]  rosuvastatin  (CRESTOR ) 10 MG tablet Take 1 tablet (10 mg total) by mouth daily. 04/07/23   Francyne Romano, MD  tirzepatide  (MOUNJARO ) 15 MG/0.5ML Pen Inject 15 mg into the skin once a week. 11/18/23   Francyne Romano, MD  VITAMIN D , CHOLECALCIFEROL, PO Take by mouth. OTC dosage for unknown    [provider]    Family History Family History  Adopted: Yes    Social History Social History   Tobacco Use   Smoking status: Never    Passive exposure: Past   Smokeless tobacco: Never  Vaping Use   Vaping status: Never Used  Substance Use Topics   Alcohol use: No   Drug use: No     Allergies   Sulfa antibiotics   Review of Systems Review of Systems  Constitutional:  Positive for chills. Negative for fever.  Gastrointestinal:  Positive for abdominal pain and nausea. Negative for vomiting.  Genitourinary:  Positive for dysuria and frequency. Negative for flank pain.  Musculoskeletal:  Negative for back pain.  All  other systems reviewed and are negative.    Physical Exam Triage Vital Signs ED Triage Vitals  Encounter Vitals Group     BP 12/21/23 1758 (!) 121/105     Girls Systolic BP Percentile --      Girls Diastolic BP Percentile --      Boys Systolic BP Percentile --      Boys Diastolic BP Percentile --      Pulse Rate 12/21/23 1758 88     Resp 12/21/23 1758 16     Temp 12/21/23 1758 98.4 F (36.9 C)  Temp Source 12/21/23 1758 Oral     SpO2 12/21/23 1758 99 %     Weight --      Height --      Head Circumference --      Peak Flow --      Pain Score 12/21/23 1801 9     Pain Loc --      Pain Education --      Exclude from Growth Chart --    No data found.  Updated Vital Signs BP (!) 121/105   Pulse 88   Temp 98.4 F (36.9 C) (Oral)   Resp 16   LMP 07/16/2016 (Approximate)   SpO2 99%   Visual Acuity Right Eye Distance:   Left Eye Distance:   Bilateral Distance:    Right Eye Near:   Left Eye Near:    Bilateral Near:     Physical Exam Vitals reviewed.  Constitutional:      General: She is awake. She is not in acute distress.    Appearance: Normal appearance. She is well-developed. She is not ill-appearing, toxic-appearing or diaphoretic.     Comments: Patient is grimacing and uncomfortable but in no acute distress  HENT:     Head: Normocephalic.     Right Ear: Hearing normal.     Left Ear: Hearing normal.     Nose: Nose normal.     Mouth/Throat:     Mouth: Mucous membranes are moist.  Eyes:     General: Vision grossly intact.     Conjunctiva/sclera: Conjunctivae normal.  Cardiovascular:     Rate and Rhythm: Normal rate and regular rhythm.     Heart sounds: Normal heart sounds.  Pulmonary:     Effort: Pulmonary effort is normal.     Breath sounds: Normal breath sounds and air entry.  Abdominal:     Palpations: Abdomen is soft.     Tenderness: There is abdominal tenderness in the suprapubic area. There is no right CVA tenderness or left CVA tenderness.   Musculoskeletal:        General: Normal range of motion.     Cervical back: Full passive range of motion without pain, normal range of motion and neck supple.  Skin:    General: Skin is warm and dry.  Neurological:     General: No focal deficit present.     Mental Status: She is alert and oriented to person, place, and time.  Psychiatric:        Speech: Speech normal.        Behavior: Behavior is cooperative.      UC Treatments / Results  Labs (all labs ordered are listed, but only abnormal results are displayed) Labs Reviewed  POCT URINALYSIS DIP (MANUAL ENTRY) - Abnormal; Notable for the following components:      Result Value   Clarity, UA cloudy (*)    Blood, UA large (*)    Protein Ur, POC =30 (*)    Nitrite, UA Positive (*)    Leukocytes, UA Moderate (2+) (*)    All other components within normal limits  URINE CULTURE  POCT URINE PREGNANCY    EKG   Radiology No results found.  Procedures Procedures (including critical care time)  Medications Ordered in UC Medications  ketorolac  (TORADOL ) injection 60 mg (60 mg Intramuscular Given 12/21/23 1922)  ondansetron  (ZOFRAN -ODT) disintegrating tablet 8 mg (8 mg Oral Given 12/21/23 1921)    Initial Impression / Assessment and Plan / UC Course  I have  reviewed the triage vital signs and the nursing notes.  Pertinent labs & imaging results that were available during my care of the patient were reviewed by me and considered in my medical decision making (see chart for details).    Patient presents with dysuria, urinary frequency, nausea, lower abdominal pain, and chills without fever. Urinalysis on 12/17/2023 showed cloudy urine, 1+ leukocytes, some RBCs, negative nitrites, and a negative urine culture. Repeat urinalysis today shows worsening findings with cloudy urine, moderate leukocytes, positive nitrites, and blood present. Despite the prior negative culture, current symptoms and urinalysis are consistent with a urinary  tract infection. A repeat urine culture was sent. Nitrofurantoin  prescribed twice daily for 5 days, and phenazopyridine  provided for urinary discomfort. Toradol  IM was given for pain relief, and Zofran  ODT 8 mg was given for nausea in clinic. Patient was advised to monitor for worsening symptoms and to follow up with her PCP or return to the clinic if symptoms do not improve, with ED precautions for fever, flank pain, vomiting, or inability to urinate.  Today's evaluation has revealed no signs of a dangerous process. Discussed diagnosis with patient and/or guardian. Patient and/or guardian aware of their diagnosis, possible red flag symptoms to watch out for and need for close follow up. Patient and/or guardian understands verbal and written discharge instructions. Patient and/or guardian comfortable with plan and disposition.  Patient and/or guardian has a clear mental status at this time, good insight into illness (after discussion and teaching) and has clear judgment to make decisions regarding their care  Documentation was completed with the aid of voice recognition software. Transcription may contain typographical errors.  Final Clinical Impressions(s) / UC Diagnoses   Final diagnoses:  Dysuria  Acute cystitis with hematuria     Discharge Instructions      You were seen today for symptoms of a urinary tract infection, including burning with urination, frequent urination, nausea, lower abdominal pain, and chills. Your urine test today showed evidence of infection, and a urine culture has been sent to confirm the bacteria causing it. You were prescribed nitrofurantoin  to take twice daily for 5 days--finish all doses even if you feel better before the medication is gone. You were also prescribed phenazopyridine  (Pyridium ) to help with urinary discomfort; this may turn your urine an orange or reddish color, which is normal and harmless. In the clinic, you received an injection of Toradol  for pain  relief and Zofran  for nausea. Drink plenty of fluids to help flush bacteria from your urinary tract, and empty your bladder regularly. Avoid alcohol while taking antibiotics, and avoid caffeine if it worsens bladder discomfort.  Follow up with your primary care provider if your symptoms do not improve within 48-72 hours or if you have recurrent infections. Seek immediate medical care if you develop fever, flank or back pain, worsening abdominal pain, vomiting, inability to urinate, blood in your urine, or any sudden change in your condition.      ED Prescriptions     Medication Sig Dispense Auth. Provider   nitrofurantoin , macrocrystal-monohydrate, (MACROBID ) 100 MG capsule Take 1 capsule (100 mg total) by mouth 2 (two) times daily. 10 capsule Iola Lukes, FNP   phenazopyridine  (PYRIDIUM ) 200 MG tablet Take 1 tablet (200 mg total) by mouth 3 (three) times daily at 8am, 3pm and bedtime for 2 days. 6 tablet Iola Lukes, FNP   ondansetron  (ZOFRAN -ODT) 8 MG disintegrating tablet Take 1 tablet (8 mg total) by mouth every 8 (eight) hours as needed for nausea  or vomiting. 12 tablet Iola Lukes, FNP      PDMP not reviewed this encounter.   Iola Lukes, [place] 12/21/23 1924

## 2023-12-22 ENCOUNTER — Ambulatory Visit (INDEPENDENT_AMBULATORY_CARE_PROVIDER_SITE_OTHER): Payer: Self-pay | Admitting: Internal Medicine

## 2023-12-23 ENCOUNTER — Ambulatory Visit (HOSPITAL_COMMUNITY): Payer: Self-pay

## 2023-12-23 LAB — URINE CULTURE: Culture: >=100000 — AB

## 2023-12-25 ENCOUNTER — Telehealth (INDEPENDENT_AMBULATORY_CARE_PROVIDER_SITE_OTHER): Payer: Self-pay | Admitting: Internal Medicine

## 2023-12-25 NOTE — Telephone Encounter (Signed)
 Pt is taking Macrobid  (prescribe by Doctor at Urgent Care),for UTI and and is not better.  She is wanting to now if Dr. Francyne can do something so she does not get worse over the weekend. Advised pt to follow up with the prescribing/treating Doctor at Urgent Care.

## 2023-12-25 NOTE — Telephone Encounter (Signed)
 Pt called in stating she was in urgent care for a urinary tract infection and she had some questions. Pt reports she does not know if it is clearing up and she does not want to have to go back to urgent care over the weekend. Please follow up with the patient, she asks that she receives a phone call and not a mychart message because she is having issue with it.

## 2023-12-25 NOTE — Telephone Encounter (Signed)
 Pt called reporting her symptoms have somewhat improved but she does not feel quite back to normal. She had reached out to her PCP but was advised to f/u with UC.   Advised of results. Pt is a nurse and verbalized understanding. She does not want to return for visit this soon. States she will give the macrobid  more time to work. Pt will f/u with UC or PCP if symptoms do not resolve.

## 2024-01-01 DIAGNOSIS — E1165 Type 2 diabetes mellitus with hyperglycemia: Secondary | ICD-10-CM | POA: Diagnosis not present

## 2024-01-01 DIAGNOSIS — R350 Frequency of micturition: Secondary | ICD-10-CM | POA: Diagnosis not present

## 2024-01-05 DIAGNOSIS — M170 Bilateral primary osteoarthritis of knee: Secondary | ICD-10-CM | POA: Diagnosis not present

## 2024-01-05 DIAGNOSIS — M25561 Pain in right knee: Secondary | ICD-10-CM | POA: Diagnosis not present

## 2024-01-13 ENCOUNTER — Other Ambulatory Visit: Payer: Self-pay | Admitting: Physician Assistant

## 2024-01-13 DIAGNOSIS — Z1231 Encounter for screening mammogram for malignant neoplasm of breast: Secondary | ICD-10-CM

## 2024-01-13 DIAGNOSIS — N3001 Acute cystitis with hematuria: Secondary | ICD-10-CM | POA: Diagnosis not present

## 2024-01-22 ENCOUNTER — Ambulatory Visit (INDEPENDENT_AMBULATORY_CARE_PROVIDER_SITE_OTHER): Admitting: Internal Medicine

## 2024-01-22 ENCOUNTER — Encounter (INDEPENDENT_AMBULATORY_CARE_PROVIDER_SITE_OTHER): Payer: Self-pay | Admitting: Internal Medicine

## 2024-01-22 VITALS — BP 105/75 | HR 100 | Temp 98.4°F | Ht 66.0 in | Wt 258.0 lb

## 2024-01-22 DIAGNOSIS — Z7985 Long-term (current) use of injectable non-insulin antidiabetic drugs: Secondary | ICD-10-CM

## 2024-01-22 DIAGNOSIS — E66813 Obesity, class 3: Secondary | ICD-10-CM

## 2024-01-22 DIAGNOSIS — N390 Urinary tract infection, site not specified: Secondary | ICD-10-CM | POA: Diagnosis not present

## 2024-01-22 DIAGNOSIS — Z6841 Body Mass Index (BMI) 40.0 and over, adult: Secondary | ICD-10-CM

## 2024-01-22 DIAGNOSIS — G4733 Obstructive sleep apnea (adult) (pediatric): Secondary | ICD-10-CM

## 2024-01-22 DIAGNOSIS — Z7984 Long term (current) use of oral hypoglycemic drugs: Secondary | ICD-10-CM

## 2024-01-22 DIAGNOSIS — E1169 Type 2 diabetes mellitus with other specified complication: Secondary | ICD-10-CM | POA: Diagnosis not present

## 2024-01-22 MED ORDER — TIRZEPATIDE 15 MG/0.5ML ~~LOC~~ SOAJ: 15.0000 mg | SUBCUTANEOUS | 0 refills | Status: DC

## 2024-01-22 NOTE — Progress Notes (Signed)
 Office: [phone]  /  Fax: [phone]  Weight Summary and Body Composition Analysis (BIA)  Vitals Temp: 98.4 F (36.9 C) BP: 105/75 Pulse Rate: 100 SpO2: 95 %   Anthropometric Measurements Height: 5' 6 (1.676 m) Weight: 258 lb (117 kg) BMI (Calculated): 41.66 Weight at Last Visit: 263 lb Weight Lost Since Last Visit: 5 lb Weight Gained Since Last Visit: 0 lb Starting Weight: 339 lb Total Weight Loss (lbs): 81 lb (36.7 kg) Peak Weight: 339 lb   Body Composition  Body Fat %: 45.2 % Fat Mass (lbs): 116.6 lbs Muscle Mass (lbs): 134.2 lbs Total Body Water (lbs): 87.2 lbs Visceral Fat Rating : 15    RMR: [id number]  Today's Visit #: 27  Starting Date: 02/12/22   Subjective   Chief Complaint: Obesity  Interval History Discussed the use of AI scribe software for clinical note transcription with the patient, who gave verbal consent to proceed.  History of Present Illness Karen [name] is a 58 year old female who presents for medical weight management.  Since last office visit she has lost 5 pounds.  She reports following a 1500-calorie nutrition plan 80% of the time foods getting the recommended amount of protein does not feel like he is maintaining adequate hydration denies skipping meals she is exercising 3 to 4 days a week 90 minutes aqua exercises  She has achieved a significant weight loss of 81 pounds and is 19 pounds away from her goal of losing 100 pounds. Her exercise routine has increased to 90 minutes per session, primarily utilizing the pool, and she is attempting to increase the frequency of her workouts each week.  She experiences recurrent urinary tract infections with cultures positive for E. coli. Initial treatment with Macrobid  was ineffective, but Cipro temporarily resolved the symptoms before the infection recurred. She reports that she is taking Jardiance  and has been told that it may increase the risk of urinary tract infections. She  acknowledges insufficient water intake, which may exacerbate the issue.  She is currently taking Jardiance  and Mounjaro  for weight management and blood sugar control. Her last A1c was 5.4. She experiences headaches and some stomach issues, which she attributes to her medications.       Challenges affecting patient progress: medical comorbidities and menopause.    Pharmacotherapy for weight management: She is currently taking Metformin with diabetes as primary indication with adequate clinical response  and without side effects., Monjauro with diabetes as the primary indication and obesity secondary with adequate clinical response  and without side effects., and SGLT2 but is experiencing recurrent bladder infections.   Assessment and Plan   Treatment Plan For Obesity:  Recommended Dietary Goals  Karen [name] is currently in the action stage of change. As such, her goal is to continue weight management plan. She has agreed to: continue current plan  Behavioral Health and Counseling  We discussed the following behavioral modification strategies today: continue to work on maintaining a reduced calorie state, getting the recommended amount of protein, incorporating whole foods, making healthy choices, staying well hydrated and practicing mindfulness when eating. and increase protein intake, fibrous foods (25 grams per day for women, 30 grams for men) and water to improve satiety and decrease hunger signals. .  Additional education and resources provided today: None  Recommended Physical Activity Goals  Karen [name] has been advised to work up to 150 minutes of moderate intensity aerobic activity a week and strengthening exercises 2-3 times per week for cardiovascular health, weight loss maintenance  and preservation of muscle mass.  She has agreed to :  Think about enjoyable ways to increase daily physical activity and overcoming barriers to exercise, Increase physical activity in their day and reduce  sedentary time (increase NEAT)., Increase volume of physical activity to a goal of 240 minutes a week, and Combine aerobic and strengthening exercises for efficiency and improved cardiometabolic health.  Medical Interventions and Pharmacotherapy  We discussed various medication options to help Karen [name] with her weight loss efforts and we both agreed to : Adequate clinical response to anti-obesity medication, continue current regimen and consider discontinuation of SGLT2 due to recurrent bladder infections  Associated Conditions Impacted by Obesity Treatment  Assessment & Plan OSA on CPAP On CPAP with reported good compliance. Continue PAP therapy.  Consider repeating sleep study as she has lost approximately 24% of total body weight therefore possibly reducing AHI.  Continue current weight management strategy inclusive of GLP-1  Class 3 severe obesity with serious comorbidity and body mass index (BMI) of 40.0 to 44.9 in adult Weight: decrease of 83 lb (24.3%) over 1 year, 10 months  Start: 03/18/2022 341 lb (154.7 kg) (H)  End: 01/22/2024 258 lb (117 kg)  Significant weight loss of 81 pounds achieved over two years without plateau, indicating a successful multifaceted approach. Continued weight loss is expected with current strategies, focusing on non-scale victories and overall health improvement. - Continue current weight management strategies, including exercise and dietary modifications - Schedule monthly follow-up appointments for accountability and monitoring Type 2 diabetes mellitus with other specified complication, without long-term current use of insulin  (HCC) Type 2 diabetes is well-controlled with an A1c of 5.4. Current treatment includes Mounjaro , metformin and Jardiance . Concerns about recurrent urinary tract infections potentially linked to Jardiance  due to its mechanism of increasing sugar in urine, which can promote infections. Discussed the risk of recurrent infections leading to  antibiotic resistance and the impact on gut microbiome. Consideration of alternative medications if recurrent infections persist. - Continue Mounjaro  - Increase water intake to at least two liters per day to help prevent urinary tract infections - Monitor for recurrent urinary tract infections; if another occurs, discontinue Jardiance  - Discuss alternative medications if needed, considering insurance coverage and potential side effects Recurrent UTI Recurrent urinary tract infections likely due to Jardiance  increasing sugar in urine, creating an environment conducive to infections. Previous infections treated with antibiotics, which can disrupt gut microbiome and lead to antibiotic resistance. Discussed the importance of hydration and proper hygiene practices to prevent infections. - Increase water intake - Monitor for recurrent infections; if another occurs, discontinue Jardiance           Objective   Physical Exam:  Blood pressure 105/75, pulse 100, temperature 98.4 F (36.9 C), height 5' 6 (1.676 m), weight 258 lb (117 kg), last menstrual period 07/16/2016, SpO2 95%. Body mass index is 41.64 kg/m.  General: She is overweight, cooperative, alert, well developed, and in no acute distress. PSYCH: Has normal mood, affect and thought process.   HEENT: EOMI, sclerae are anicteric. Lungs: Normal breathing effort, no conversational dyspnea. Extremities: No edema.  Neurologic: No gross sensory or motor deficits. No tremors or fasciculations noted.    Diagnostic Data Reviewed:  BMET    Component Value Date/Time   NA 140 02/12/2022 1047   K 4.3 02/12/2022 1047   CL 99 02/12/2022 1047   CO2 25 02/12/2022 1047   GLUCOSE 115 (H) 02/12/2022 1047   GLUCOSE 131 (H) 12/02/2017 1619   BUN 18 02/12/2022  1047   CREATININE 0.72 02/12/2022 1047   CALCIUM  9.8 02/12/2022 1047   GFRNONAA 103 04/13/2019 0000   GFRAA 118 04/13/2019 0000   Lab Results  Component Value Date   HGBA1C 6.0 (H)  05/16/2022   HGBA1C 6.3 (H) 04/13/2019   Lab Results  Component Value Date   INSULIN  12.5 02/12/2022   INSULIN  15.1 12/16/2018   Lab Results  Component Value Date   TSH 0.988 02/12/2022   CBC    Component Value Date/Time   WBC 5.9 02/12/2022 1047   WBC 7.9 12/02/2017 1619   RBC 5.06 02/12/2022 1047   RBC 5.57 (H) 12/02/2017 1619   HGB 13.0 02/12/2022 1047   HCT 40.8 02/12/2022 1047   PLT 220 02/12/2022 1047   MCV 81 02/12/2022 1047   MCH 25.7 (L) 02/12/2022 1047   MCH 25.0 (L) 12/02/2017 1619   MCHC 31.9 02/12/2022 1047   MCHC 31.5 12/02/2017 1619   RDW 14.1 02/12/2022 1047   Iron Studies No results found for: IRON, TIBC, FERRITIN, IRONPCTSAT Lipid Panel     Component Value Date/Time   CHOL 149 02/12/2022 1047   TRIG 119 02/12/2022 1047   HDL 52 02/12/2022 1047   CHOLHDL 2.9 02/12/2022 1047   LDLCALC 76 02/12/2022 1047   Hepatic Function Panel     Component Value Date/Time   PROT 6.9 02/12/2022 1047   ALBUMIN 4.4 02/12/2022 1047   AST 17 02/12/2022 1047   ALT 14 02/12/2022 1047   ALKPHOS 83 02/12/2022 1047   BILITOT 0.3 02/12/2022 1047      Component Value Date/Time   TSH 0.988 02/12/2022 1047   Nutritional Lab Results  Component Value Date   VD25OH 35.6 10/21/2022   VD25OH 28.4 (L) 07/08/2022   VD25OH 16.3 (L) 02/12/2022    Medications: Outpatient Encounter Medications as of 01/22/2024  Medication Sig   CLOMIPRAMINE HCL PO Take 100 mg by mouth daily. 100 mg daily   empagliflozin  (JARDIANCE ) 10 MG TABS tablet Take 1 tablet (10 mg total) by mouth daily before breakfast.   fluvoxaMINE (LUVOX) 50 MG tablet Take 50 mg by mouth at bedtime. (Patient taking differently: Take 100 mg by mouth at bedtime.)   hydrOXYzine HCl (ATARAX PO) Take 25 mg by mouth at bedtime. Unknown dosage   metFORMIN (GLUCOPHAGE) 1000 MG tablet Take 1,000 mg by mouth 2 (two) times daily with a meal.   Multiple Vitamins-Minerals (MULTIVITAMIN) tablet Take 1 tablet by mouth  daily.   nitrofurantoin , macrocrystal-monohydrate, (MACROBID ) 100 MG capsule Take 1 capsule (100 mg total) by mouth 2 (two) times daily.   ondansetron  (ZOFRAN -ODT) 8 MG disintegrating tablet Take 1 tablet (8 mg total) by mouth every 8 (eight) hours as needed for nausea or vomiting.   pantoprazole (PROTONIX) 40 MG tablet Take 40 mg by mouth daily.   Prucalopride Succinate (MOTEGRITY PO) Take by mouth daily at 12 noon. Dosage unknow   rosuvastatin  (CRESTOR ) 10 MG tablet Take 1 tablet (10 mg total) by mouth daily.   VITAMIN D , CHOLECALCIFEROL, PO Take by mouth. OTC dosage for unknown   [DISCONTINUED] tirzepatide  (MOUNJARO ) 15 MG/0.5ML Pen Inject 15 mg into the skin once a week.   tirzepatide  (MOUNJARO ) 15 MG/0.5ML Pen Inject 15 mg into the skin once a week.   No facility-administered encounter medications on file as of 01/22/2024.     Follow-Up   Return in about 4 weeks (around 02/19/2024) for For Weight Mangement with Dr. [email or web address].[email or web address] She was informed of the importance of frequent follow  up visits to maximize her success with intensive lifestyle modifications for her multiple health conditions.  Attestation Statement   Reviewed by clinician on day of visit: allergies, medications, problem list, medical history, surgical history, family history, social history, and previous encounter notes.     Lucas Parker, MD

## 2024-01-22 NOTE — Assessment & Plan Note (Signed)
 Type 2 diabetes is well-controlled with an A1c of 5.4. Current treatment includes Mounjaro , metformin and Jardiance . Concerns about recurrent urinary tract infections potentially linked to Jardiance  due to its mechanism of increasing sugar in urine, which can promote infections. Discussed the risk of recurrent infections leading to antibiotic resistance and the impact on gut microbiome. Consideration of alternative medications if recurrent infections persist. - Continue Mounjaro  - Increase water intake to at least two liters per day to help prevent urinary tract infections - Monitor for recurrent urinary tract infections; if another occurs, discontinue Jardiance  - Discuss alternative medications if needed, considering insurance coverage and potential side effects

## 2024-01-22 NOTE — Assessment & Plan Note (Signed)
 On CPAP with reported good compliance. Continue PAP therapy.  Consider repeating sleep study as she has lost approximately 24% of total body weight therefore possibly reducing AHI.  Continue current weight management strategy inclusive of GLP-1

## 2024-01-22 NOTE — Assessment & Plan Note (Signed)
 Weight: decrease of 83 lb (24.3%) over 1 year, 10 months  Start: 03/18/2022 341 lb (154.7 kg) (H)  End: 01/22/2024 258 lb (117 kg)  Significant weight loss of 81 pounds achieved over two years without plateau, indicating a successful multifaceted approach. Continued weight loss is expected with current strategies, focusing on non-scale victories and overall health improvement. - Continue current weight management strategies, including exercise and dietary modifications - Schedule monthly follow-up appointments for accountability and monitoring

## 2024-02-23 ENCOUNTER — Ambulatory Visit (INDEPENDENT_AMBULATORY_CARE_PROVIDER_SITE_OTHER): Admitting: Internal Medicine

## 2024-02-24 ENCOUNTER — Ambulatory Visit (INDEPENDENT_AMBULATORY_CARE_PROVIDER_SITE_OTHER): Admitting: Internal Medicine

## 2024-02-24 ENCOUNTER — Encounter (INDEPENDENT_AMBULATORY_CARE_PROVIDER_SITE_OTHER): Payer: Self-pay | Admitting: Internal Medicine

## 2024-02-24 VITALS — BP 115/81 | HR 77 | Temp 98.0°F | Ht 66.0 in | Wt 258.0 lb

## 2024-02-24 DIAGNOSIS — Z7985 Long-term (current) use of injectable non-insulin antidiabetic drugs: Secondary | ICD-10-CM

## 2024-02-24 DIAGNOSIS — E1169 Type 2 diabetes mellitus with other specified complication: Secondary | ICD-10-CM

## 2024-02-24 DIAGNOSIS — G4733 Obstructive sleep apnea (adult) (pediatric): Secondary | ICD-10-CM | POA: Diagnosis not present

## 2024-02-24 DIAGNOSIS — R638 Other symptoms and signs concerning food and fluid intake: Secondary | ICD-10-CM

## 2024-02-24 DIAGNOSIS — E66813 Obesity, class 3: Secondary | ICD-10-CM

## 2024-02-24 DIAGNOSIS — Z6841 Body Mass Index (BMI) 40.0 and over, adult: Secondary | ICD-10-CM

## 2024-02-24 DIAGNOSIS — Z7984 Long term (current) use of oral hypoglycemic drugs: Secondary | ICD-10-CM

## 2024-02-24 NOTE — Assessment & Plan Note (Signed)
 Weight: decrease of 83 lb (24.3%) over 1 year, 10 months  Start: 03/18/2022 341 lb (154.7 kg) (H)  End: 01/22/2024 258 lb (117 kg)  Obesity management is ongoing with a focus on gradual weight loss. Despite recent challenges, weight maintenance was achieved. Body composition analysis shows a decrease in body fat percentage and visceral fat rating, indicating positive progress. Discussed the impact of artificial sweeteners on weight management and the potential for them to increase hunger and fat deposition. Highlighted the risks of rapid weight loss, including muscle loss and metabolic issues, and reinforced the benefits of gradual weight loss for sustainability. - Continue current weight management plan with focus on gradual weight loss - Encourage reduction of artificial sweeteners due to recent data and safety concerns and exploration of natural alternatives like Stevia or monk fruit - Maintain regular physical activity as feasible - Monitor body composition changes

## 2024-02-24 NOTE — Assessment & Plan Note (Signed)
 Improved on Mounjaro  15 mg a day  She has increased orexigenic signaling, impaired satiety and inhibitory control. 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  We reviewed today the the role of artificial sweeteners and its effect on appetite, glucose metabolism and hunger.  She will work on reducing the use of artificial sweeteners

## 2024-02-24 NOTE — Assessment & Plan Note (Signed)
 On CPAP with reported good compliance. Continue PAP therapy.  Consider repeating sleep study as she has lost approximately 24% of total body weight therefore possibly reducing AHI.  Continue current weight management strategy inclusive of GLP-1

## 2024-02-24 NOTE — Assessment & Plan Note (Signed)
 Type 2 diabetes is well-controlled with an A1c of 5.4. Current treatment includes Mounjaro , metformin and Jardiance . Concerns about recurrent urinary tract infections potentially linked to Jardiance  due to its mechanism of increasing sugar in urine, which can promote infections. Discussed the risk of recurrent infections leading to antibiotic resistance and the impact on gut microbiome. Consideration of alternative medications if recurrent infections persist. - Continue Mounjaro  - Increase water intake to at least two liters per day to help prevent urinary tract infections - Monitor for recurrent urinary tract infections; if another occurs, discontinue Jardiance  - Discuss alternative medications if needed, considering insurance coverage and potential side effects

## 2024-02-24 NOTE — Progress Notes (Signed)
 Office: [phone]  /  Fax: [phone]  Weight Summary and Body Composition Analysis (BIA)  Vitals Temp: 98 F (36.7 C) BP: 115/81 Pulse Rate: 77 SpO2: 98 %   Anthropometric Measurements Height: 5' 6 (1.676 m) Weight: 258 lb (117 kg) BMI (Calculated): 41.66 Weight at Last Visit: 258 lb Weight Lost Since Last Visit: 0 lb Weight Gained Since Last Visit: 0 lb Starting Weight: 339 lb Total Weight Loss (lbs): 81 lb (36.7 kg) Peak Weight: 339 lb   Body Composition  Body Fat %: 44.8 % Fat Mass (lbs): 115.8 lbs Muscle Mass (lbs): 135.6 lbs Total Body Water (lbs): 88.2 lbs Visceral Fat Rating : 14    RMR: [id number]  Today's Visit #: 28  Starting Date: 02/12/22   Subjective   Chief Complaint: Obesity  Interval History Discussed the use of AI scribe software for clinical note transcription with the patient, who gave verbal consent to proceed.  History of Present Illness Karen [name] is a 58 year old female with type 2 diabetes who presents for medical weight management.  She has maintained her weight despite recent challenges, including her mother's hospitalization. She is relieved at not gaining weight and is pleased with her progress. She uses her CPAP every night and notes a decrease in her physical activity, now exercising only once a week.  She has a history of type 2 diabetes and discusses her use of artificial sweeteners, particularly in tea, and their potential impact on her health. She is curious about the effects of these sweeteners on her liver and overall health, especially in light of her significant weight loss of 81 pounds. She is considering reducing her intake of artificial sweeteners and exploring alternatives like Stevia and monk fruit.  No current issues with bladder infections and urination is normal, although she acknowledges not drinking as much water as she should. She continues to drink a lot of tea, which she acknowledges could contribute  to kidney stones due to its oxalate content.  She reflects on her weight loss journey, expressing satisfaction with her gradual progress and a preference for sustainable weight loss methods over rapid weight loss strategies. She mentions a past recommendation to lose 100 pounds in six months, which she found unrealistic. She is committed to continuing her current approach to weight management.     Challenges affecting patient progress: strong hunger signals and/or impaired satiety / inhibitory control, medical comorbidities, and recent lapse in weight management plan due to work, travel, illness or family circumstances.    Pharmacotherapy for weight management: She is currently taking Metformin with diabetes as primary indication with adequate clinical response  and without side effects., Monjauro with diabetes as the primary indication and obesity secondary with adequate clinical response  and without side effects., and SGLT2.   Assessment and Plan   Treatment Plan For Obesity:  Recommended Dietary Goals  Karen [name] is currently in the action stage of change. As such, her goal is to continue weight management plan. She has agreed to: continue current plan.  We discussed the roles of artificial sweeteners and obesity and other metabolic conditions.  She will work on reducing the use of Splenda.  Behavioral Health and Counseling  We discussed the following behavioral modification strategies today: continue to work on maintaining a reduced calorie state, getting the recommended amount of protein, incorporating whole foods, making healthy choices, staying well hydrated and practicing mindfulness when eating. and increase protein intake, fibrous foods (25 grams per day for women, 30  grams for men) and water to improve satiety and decrease hunger signals. .  Additional education and resources provided today: None  Recommended Physical Activity Goals  Karen [name] has been advised to work up to 150 minutes  of moderate intensity aerobic activity a week and strengthening exercises 2-3 times per week for cardiovascular health, weight loss maintenance and preservation of muscle mass.  She has agreed to :  Think about enjoyable ways to increase daily physical activity and overcoming barriers to exercise, Increase physical activity in their day and reduce sedentary time (increase NEAT)., Increase volume of physical activity to a goal of 240 minutes a week, and Combine aerobic and strengthening exercises for efficiency and improved cardiometabolic health.  Medical Interventions and Pharmacotherapy  We discussed various medication options to help Karen [name] with her weight loss efforts and we both agreed to : Adequate clinical response to anti-obesity medication, continue current regimen  Associated Conditions Impacted by Obesity Treatment  Assessment & Plan OSA on CPAP On CPAP with reported good compliance. Continue PAP therapy.  Consider repeating sleep study as she has lost approximately 24% of total body weight therefore possibly reducing AHI.  Continue current weight management strategy inclusive of GLP-1  Type 2 diabetes mellitus with other specified complication, without long-term current use of insulin  (HCC) Type 2 diabetes is well-controlled with an A1c of 5.4. Current treatment includes Mounjaro , metformin and Jardiance . Concerns about recurrent urinary tract infections potentially linked to Jardiance  due to its mechanism of increasing sugar in urine, which can promote infections. Discussed the risk of recurrent infections leading to antibiotic resistance and the impact on gut microbiome. Consideration of alternative medications if recurrent infections persist. - Continue Mounjaro  - Increase water intake to at least two liters per day to help prevent urinary tract infections - Monitor for recurrent urinary tract infections; if another occurs, discontinue Jardiance  - Discuss alternative medications if  needed, considering insurance coverage and potential side effects Abnormal food appetite Improved on Mounjaro  15 mg a day  She has increased orexigenic signaling, impaired satiety and inhibitory control. 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  We reviewed today the the role of artificial sweeteners and its effect on appetite, glucose metabolism and hunger.  She will work on reducing the use of artificial sweeteners Class 3 severe obesity with serious comorbidity and body mass index (BMI) of 40.0 to 44.9 in adult, unspecified obesity type (HCC) Weight: decrease of 83 lb (24.3%) over 1 year, 10 months  Start: 03/18/2022 341 lb (154.7 kg) (H)  End: 01/22/2024 258 lb (117 kg)  Obesity management is ongoing with a focus on gradual weight loss. Despite recent challenges, weight maintenance was achieved. Body composition analysis shows a decrease in body fat percentage and visceral fat rating, indicating positive progress. Discussed the impact of artificial sweeteners on weight management and the potential for them to increase hunger and fat deposition. Highlighted the risks of rapid weight loss, including muscle loss and metabolic issues, and reinforced the benefits of gradual weight loss for sustainability. - Continue current weight management plan with focus on gradual weight loss - Encourage reduction of artificial sweeteners due to recent data and safety concerns and exploration of natural alternatives like Stevia or monk fruit - Maintain regular physical activity as feasible - Monitor body composition changes         Objective   Physical Exam:  Blood pressure 115/81, pulse 77, temperature 98 F (36.7 C), height 5'  6 (1.676 m), weight 258 lb (117 kg), last menstrual period 07/16/2016, SpO2 98%. Body mass index is 41.64 kg/m.  General: She is overweight, cooperative, alert, well developed, and in no  acute distress. PSYCH: Has normal mood, affect and thought process.   HEENT: EOMI, sclerae are anicteric. Lungs: Normal breathing effort, no conversational dyspnea. Extremities: No edema.  Neurologic: No gross sensory or motor deficits. No tremors or fasciculations noted.    Diagnostic Data Reviewed:  BMET    Component Value Date/Time   NA 140 02/12/2022 1047   K 4.3 02/12/2022 1047   CL 99 02/12/2022 1047   CO2 25 02/12/2022 1047   GLUCOSE 115 (H) 02/12/2022 1047   GLUCOSE 131 (H) 12/02/2017 1619   BUN 18 02/12/2022 1047   CREATININE 0.72 02/12/2022 1047   CALCIUM  9.8 02/12/2022 1047   GFRNONAA 103 04/13/2019 0000   GFRAA 118 04/13/2019 0000   Lab Results  Component Value Date   HGBA1C 6.0 (H) 05/16/2022   HGBA1C 6.3 (H) 04/13/2019   Lab Results  Component Value Date   INSULIN  12.5 02/12/2022   INSULIN  15.1 12/16/2018   Lab Results  Component Value Date   TSH 0.988 02/12/2022   CBC    Component Value Date/Time   WBC 5.9 02/12/2022 1047   WBC 7.9 12/02/2017 1619   RBC 5.06 02/12/2022 1047   RBC 5.57 (H) 12/02/2017 1619   HGB 13.0 02/12/2022 1047   HCT 40.8 02/12/2022 1047   PLT 220 02/12/2022 1047   MCV 81 02/12/2022 1047   MCH 25.7 (L) 02/12/2022 1047   MCH 25.0 (L) 12/02/2017 1619   MCHC 31.9 02/12/2022 1047   MCHC 31.5 12/02/2017 1619   RDW 14.1 02/12/2022 1047   Iron Studies No results found for: IRON, TIBC, FERRITIN, IRONPCTSAT Lipid Panel     Component Value Date/Time   CHOL 149 02/12/2022 1047   TRIG 119 02/12/2022 1047   HDL 52 02/12/2022 1047   CHOLHDL 2.9 02/12/2022 1047   LDLCALC 76 02/12/2022 1047   Hepatic Function Panel     Component Value Date/Time   PROT 6.9 02/12/2022 1047   ALBUMIN 4.4 02/12/2022 1047   AST 17 02/12/2022 1047   ALT 14 02/12/2022 1047   ALKPHOS 83 02/12/2022 1047   BILITOT 0.3 02/12/2022 1047      Component Value Date/Time   TSH 0.988 02/12/2022 1047   Nutritional Lab Results  Component Value  Date   VD25OH 35.6 10/21/2022   VD25OH 28.4 (L) 07/08/2022   VD25OH 16.3 (L) 02/12/2022    Medications: Outpatient Encounter Medications as of 02/24/2024  Medication Sig   CLOMIPRAMINE HCL PO Take 100 mg by mouth daily. 100 mg daily   empagliflozin  (JARDIANCE ) 10 MG TABS tablet Take 1 tablet (10 mg total) by mouth daily before breakfast.   fluvoxaMINE (LUVOX) 50 MG tablet Take 50 mg by mouth at bedtime. (Patient taking differently: Take 100 mg by mouth at bedtime.)   hydrOXYzine HCl (ATARAX PO) Take 25 mg by mouth at bedtime. Unknown dosage   metFORMIN (GLUCOPHAGE) 1000 MG tablet Take 1,000 mg by mouth 2 (two) times daily with a meal.   Multiple Vitamins-Minerals (MULTIVITAMIN) tablet Take 1 tablet by mouth daily.   pantoprazole (PROTONIX) 40 MG tablet Take 40 mg by mouth daily.   Prucalopride Succinate (MOTEGRITY PO) Take by mouth daily at 12 noon. Dosage unknow   rosuvastatin  (CRESTOR ) 10 MG tablet Take 1 tablet (10 mg total) by mouth daily.   tirzepatide  (MOUNJARO )  15 MG/0.5ML Pen Inject 15 mg into the skin once a week.   VITAMIN D , CHOLECALCIFEROL, PO Take by mouth. OTC dosage for unknown   No facility-administered encounter medications on file as of 02/24/2024.     Follow-Up   Return in about 4 weeks (around 03/23/2024) for For Weight Mangement with Dr. Francyne.SABRA She was informed of the importance of frequent follow up visits to maximize her success with intensive lifestyle modifications for her multiple health conditions.  Attestation Statement   Reviewed by clinician on day of visit: allergies, medications, problem list, medical history, surgical history, family history, social history, and previous encounter notes.     Lucas Francyne, MD

## 2024-03-23 DIAGNOSIS — G8929 Other chronic pain: Secondary | ICD-10-CM | POA: Diagnosis not present

## 2024-03-23 DIAGNOSIS — M545 Low back pain, unspecified: Secondary | ICD-10-CM | POA: Diagnosis not present

## 2024-03-23 DIAGNOSIS — Z0000 Encounter for general adult medical examination without abnormal findings: Secondary | ICD-10-CM | POA: Diagnosis not present

## 2024-03-23 DIAGNOSIS — K219 Gastro-esophageal reflux disease without esophagitis: Secondary | ICD-10-CM | POA: Diagnosis not present

## 2024-03-23 DIAGNOSIS — E1165 Type 2 diabetes mellitus with hyperglycemia: Secondary | ICD-10-CM | POA: Diagnosis not present

## 2024-03-23 DIAGNOSIS — E782 Mixed hyperlipidemia: Secondary | ICD-10-CM | POA: Diagnosis not present

## 2024-03-23 DIAGNOSIS — K581 Irritable bowel syndrome with constipation: Secondary | ICD-10-CM | POA: Diagnosis not present

## 2024-03-24 ENCOUNTER — Ambulatory Visit (INDEPENDENT_AMBULATORY_CARE_PROVIDER_SITE_OTHER): Admitting: Internal Medicine

## 2024-03-30 DIAGNOSIS — E119 Type 2 diabetes mellitus without complications: Secondary | ICD-10-CM | POA: Diagnosis not present

## 2024-03-30 DIAGNOSIS — H2513 Age-related nuclear cataract, bilateral: Secondary | ICD-10-CM | POA: Diagnosis not present

## 2024-04-01 ENCOUNTER — Ambulatory Visit (INDEPENDENT_AMBULATORY_CARE_PROVIDER_SITE_OTHER): Payer: Self-pay | Admitting: Internal Medicine

## 2024-04-01 ENCOUNTER — Encounter (INDEPENDENT_AMBULATORY_CARE_PROVIDER_SITE_OTHER): Payer: Self-pay | Admitting: Internal Medicine

## 2024-04-01 VITALS — BP 120/74 | HR 85 | Temp 98.7°F | Ht 66.0 in | Wt 252.0 lb

## 2024-04-01 DIAGNOSIS — E785 Hyperlipidemia, unspecified: Secondary | ICD-10-CM | POA: Diagnosis not present

## 2024-04-01 DIAGNOSIS — E1169 Type 2 diabetes mellitus with other specified complication: Secondary | ICD-10-CM

## 2024-04-01 DIAGNOSIS — Z7985 Long-term (current) use of injectable non-insulin antidiabetic drugs: Secondary | ICD-10-CM

## 2024-04-01 DIAGNOSIS — Z6841 Body Mass Index (BMI) 40.0 and over, adult: Secondary | ICD-10-CM

## 2024-04-01 DIAGNOSIS — E66813 Obesity, class 3: Secondary | ICD-10-CM

## 2024-04-01 DIAGNOSIS — G4733 Obstructive sleep apnea (adult) (pediatric): Secondary | ICD-10-CM | POA: Diagnosis not present

## 2024-04-01 DIAGNOSIS — R638 Other symptoms and signs concerning food and fluid intake: Secondary | ICD-10-CM | POA: Diagnosis not present

## 2024-04-01 DIAGNOSIS — Z7984 Long term (current) use of oral hypoglycemic drugs: Secondary | ICD-10-CM

## 2024-04-01 MED ORDER — TIRZEPATIDE 15 MG/0.5ML ~~LOC~~ SOAJ: 15.0000 mg | SUBCUTANEOUS | 0 refills | Status: AC

## 2024-04-01 MED ORDER — TIRZEPATIDE 15 MG/0.5ML ~~LOC~~ SOAJ
15.0000 mg | SUBCUTANEOUS | 0 refills | Status: DC
Start: 2024-04-01 — End: 2024-04-01

## 2024-04-01 NOTE — Assessment & Plan Note (Signed)
 On CPAP with reported good compliance. Continue PAP therapy.  Consider repeating sleep study as she has lost approximately 24% of total body weight therefore possibly reducing AHI.  Continue current weight management strategy inclusive of GLP-1

## 2024-04-01 NOTE — Assessment & Plan Note (Signed)
 Weight: decrease of 87 lb (25.3%) over 1 year, 11 months  Start: 03/18/2022 341 lb (154.7 kg) (H)  End: 01/22/2024 258 lb (117 kg)  Management is ongoing with a focus on lifestyle modifications. She has lost five pounds since the last visit and is adhering to a 1500 calorie nutrition target 75% of the time. She exercises three days a week for about ninety minutes, primarily cardio. Currently on Mounjaro  15 mg once a week. Discussion on the impact of artificial sweeteners on weight and metabolism, highlighting potential negative effects such as increased hunger and fat buildup in muscles and liver. Encouraged to reduce artificial sweetener intake to assess impact on weight loss and overall health. - Continue Mounjaro  15 mg once a week. - Encouraged reduction of artificial sweetener intake by 50% as a challenge. - Provided handout on tackling the holidays and staying on track.

## 2024-04-01 NOTE — Assessment & Plan Note (Signed)
 Reviewed labs from [facility].  Type 2 diabetes is in remission with an A1c of 5.5. Discussion on the potential impact of artificial sweeteners on glucose metabolism and the importance of maintaining lifestyle changes to sustain remission. - Continue current lifestyle modifications to maintain diabetes remission. - Monitor A1c and glucose levels regularly. -Continue Mounjaro , metformin and SGLT2. -ABCs of diabetes were discussed she is at goal with blood pressure, cholesterol and A1c

## 2024-04-01 NOTE — Assessment & Plan Note (Signed)
 Reviewed most recent LDL cholesterol which is at goal.  She is on rosuvastatin  10 mg once a day without any adverse effects.  Continue statin therapy

## 2024-04-01 NOTE — Progress Notes (Signed)
 Office: [phone]  /  Fax: [phone]  Weight Summary and Body Composition Analysis (BIA)  Vitals Temp: 98.7 F (37.1 C) BP: 120/74 Pulse Rate: 85 SpO2: 100 %   Anthropometric Measurements Height: 5' 6 (1.676 m) Weight: 252 lb (114.3 kg) BMI (Calculated): 40.69 Weight at Last Visit: 258 lb Weight Lost Since Last Visit: 6 lb Weight Gained Since Last Visit: 0 lb Starting Weight: 339 lb Total Weight Loss (lbs): 87 lb (39.5 kg) Peak Weight: 339 lb   Body Composition  Body Fat %: 48.8 % Fat Mass (lbs): 123.4 lbs Muscle Mass (lbs): 122.8 lbs Total Body Water (lbs): 89.8 lbs Visceral Fat Rating : 15    RMR: [id number]  Today's Visit #: 28  Starting Date: 02/12/22   Subjective   Chief Complaint: Obesity  Interval History Discussed the use of AI scribe software for clinical note transcription with the patient, who gave verbal consent to proceed.  History of Present Illness Karen [name] is a 58 year old female with type 2 diabetes and sleep apnea who presents for medical weight management.  She has lost five pounds since her last visit. She adheres to a 1500 calorie diet approximately 75% of the time and exercises three days a week for about 90 minutes, focusing mostly on cardio. No difficulties or barriers in following her regimen.  She is currently taking Mounjaro  15 mg once a week as part of her anti-obesity medication regimen.  Her past medical history includes type 2 diabetes and sleep apnea, for which she uses a CPAP machine. She has recently stopped using artificial sweeteners but continues to consume Diet Dr. Nunzio and Gatorade Zero, which contain artificial sweeteners.  Her recent A1c is 5.5, and her fasting glucose was 103. She has expressed concern about her kidney function tests, specifically dehydration, but her BUN and creatinine levels appear normal.  She reports a family history of type 2 diabetes, as her mother also has the condition and  consumes regular Coke despite her diagnosis.     Challenges affecting patient progress: none.    Pharmacotherapy for weight management: She is currently taking Metformin with diabetes as primary indication with adequate clinical response  and without side effects. and Monjauro with diabetes as the primary indication and obesity secondary with adequate clinical response  and without side effects..   Assessment and Plan   Treatment Plan For Obesity:  Recommended Dietary Goals  Karen [name] is currently in the action stage of change. As such, her goal is to continue weight management plan. She has agreed to: continue current plan  Behavioral Health and Counseling  We discussed the following behavioral modification strategies today: continue to work on maintaining a reduced calorie state, getting the recommended amount of protein, incorporating whole foods, making healthy choices, staying well hydrated and practicing mindfulness when eating., increase protein intake, fibrous foods (25 grams per day for women, 30 grams for men) and water to improve satiety and decrease hunger signals. , and work on reducing artificial sweeteners and diet.  Additional education and resources provided today: Handout on traveling and holiday eating strategies  Recommended Physical Activity Goals  Karen [name] has been advised to work up to 150 minutes of moderate intensity aerobic activity a week and strengthening exercises 2-3 times per week for cardiovascular health, weight loss maintenance and preservation of muscle mass.  She has agreed to :  Continue to gradually increase the amount and intensity of exercise routine, Increase volume of physical activity to a goal of  240 minutes a week, and Combine aerobic and strengthening exercises for efficiency and improved cardiometabolic health.  Medical Interventions and Pharmacotherapy  We discussed various medication options to help Karen [name] with her weight loss efforts and we both  agreed to : Adequate clinical response to anti-obesity medication, continue current regimen  Associated Conditions Impacted by Obesity Treatment  Assessment & Plan Type 2 diabetes mellitus with other specified complication, without long-term current use of insulin  (HCC) Reviewed labs from [facility].  Type 2 diabetes is in remission with an A1c of 5.5. Discussion on the potential impact of artificial sweeteners on glucose metabolism and the importance of maintaining lifestyle changes to sustain remission. - Continue current lifestyle modifications to maintain diabetes remission. - Monitor A1c and glucose levels regularly. -Continue Mounjaro , metformin and SGLT2. -ABCs of diabetes were discussed she is at goal with blood pressure, cholesterol and A1c OSA on CPAP On CPAP with reported good compliance. Continue PAP therapy.  Consider repeating sleep study as she has lost approximately 24% of total body weight therefore possibly reducing AHI.  Continue current weight management strategy inclusive of GLP-1  Class 3 severe obesity with serious comorbidity and body mass index (BMI) of 40.0 to 44.9 in adult, unspecified obesity type (HCC) Abnormal food appetite Weight: decrease of 87 lb (25.3%) over 1 year, 11 months  Start: 03/18/2022 341 lb (154.7 kg) (H)  End: 01/22/2024 258 lb (117 kg)  Management is ongoing with a focus on lifestyle modifications. She has lost five pounds since the last visit and is adhering to a 1500 calorie nutrition target 75% of the time. She exercises three days a week for about ninety minutes, primarily cardio. Currently on Mounjaro  15 mg once a week. Discussion on the impact of artificial sweeteners on weight and metabolism, highlighting potential negative effects such as increased hunger and fat buildup in muscles and liver. Encouraged to reduce artificial sweetener intake to assess impact on weight loss and overall health. - Continue Mounjaro  15 mg once a week. - Encouraged  reduction of artificial sweetener intake by 50% as a challenge. - Provided handout on tackling the holidays and staying on track. Hyperlipidemia associated with type 2 diabetes mellitus (HCC) Reviewed most recent LDL cholesterol which is at goal.  She is on rosuvastatin  10 mg once a day without any adverse effects.  Continue statin therapy         Objective   Physical Exam:  Blood pressure 120/74, pulse 85, temperature 98.7 F (37.1 C), height 5' 6 (1.676 m), weight 252 lb (114.3 kg), last menstrual period 07/16/2016, SpO2 100%. Body mass index is 40.67 kg/m.  General: She is overweight, cooperative, alert, well developed, and in no acute distress. PSYCH: Has normal mood, affect and thought process.   HEENT: EOMI, sclerae are anicteric. Lungs: Normal breathing effort, no conversational dyspnea. Extremities: No edema.  Neurologic: No gross sensory or motor deficits. No tremors or fasciculations noted.    Diagnostic Data Reviewed:  BMET    Component Value Date/Time   NA 140 02/12/2022 1047   K 4.3 02/12/2022 1047   CL 99 02/12/2022 1047   CO2 25 02/12/2022 1047   GLUCOSE 115 (H) 02/12/2022 1047   GLUCOSE 131 (H) 12/02/2017 1619   BUN 18 02/12/2022 1047   CREATININE 0.72 02/12/2022 1047   CALCIUM  9.8 02/12/2022 1047   GFRNONAA 103 04/13/2019 0000   GFRAA 118 04/13/2019 0000   Lab Results  Component Value Date   HGBA1C 6.0 (H) 05/16/2022   HGBA1C 6.3 (  H) 04/13/2019   Lab Results  Component Value Date   INSULIN  12.5 02/12/2022   INSULIN  15.1 12/16/2018   Lab Results  Component Value Date   TSH 0.988 02/12/2022   CBC    Component Value Date/Time   WBC 5.9 02/12/2022 1047   WBC 7.9 12/02/2017 1619   RBC 5.06 02/12/2022 1047   RBC 5.57 (H) 12/02/2017 1619   HGB 13.0 02/12/2022 1047   HCT 40.8 02/12/2022 1047   PLT 220 02/12/2022 1047   MCV 81 02/12/2022 1047   MCH 25.7 (L) 02/12/2022 1047   MCH 25.0 (L) 12/02/2017 1619   MCHC 31.9 02/12/2022 1047   MCHC  31.5 12/02/2017 1619   RDW 14.1 02/12/2022 1047   Iron Studies No results found for: IRON, TIBC, FERRITIN, IRONPCTSAT Lipid Panel     Component Value Date/Time   CHOL 149 02/12/2022 1047   TRIG 119 02/12/2022 1047   HDL 52 02/12/2022 1047   CHOLHDL 2.9 02/12/2022 1047   LDLCALC 76 02/12/2022 1047   Hepatic Function Panel     Component Value Date/Time   PROT 6.9 02/12/2022 1047   ALBUMIN 4.4 02/12/2022 1047   AST 17 02/12/2022 1047   ALT 14 02/12/2022 1047   ALKPHOS 83 02/12/2022 1047   BILITOT 0.3 02/12/2022 1047      Component Value Date/Time   TSH 0.988 02/12/2022 1047   Nutritional Lab Results  Component Value Date   VD25OH 35.6 10/21/2022   VD25OH 28.4 (L) 07/08/2022   VD25OH 16.3 (L) 02/12/2022    Medications: Outpatient Encounter Medications as of 04/01/2024  Medication Sig   CLOMIPRAMINE HCL PO Take 100 mg by mouth daily. 100 mg daily   empagliflozin  (JARDIANCE ) 10 MG TABS tablet Take 1 tablet (10 mg total) by mouth daily before breakfast.   fluvoxaMINE (LUVOX) 50 MG tablet Take 50 mg by mouth at bedtime. (Patient taking differently: Take 100 mg by mouth at bedtime.)   hydrOXYzine HCl (ATARAX PO) Take 25 mg by mouth at bedtime. Unknown dosage   metFORMIN (GLUCOPHAGE) 1000 MG tablet Take 1,000 mg by mouth 2 (two) times daily with a meal.   Multiple Vitamins-Minerals (MULTIVITAMIN) tablet Take 1 tablet by mouth daily.   pantoprazole (PROTONIX) 40 MG tablet Take 40 mg by mouth daily.   Prucalopride Succinate (MOTEGRITY PO) Take by mouth daily at 12 noon. Dosage unknow   rosuvastatin  (CRESTOR ) 10 MG tablet Take 1 tablet (10 mg total) by mouth daily.   VITAMIN D , CHOLECALCIFEROL, PO Take by mouth. OTC dosage for unknown   [DISCONTINUED] tirzepatide  (MOUNJARO ) 15 MG/0.5ML Pen Inject 15 mg into the skin once a week.   tirzepatide  (MOUNJARO ) 15 MG/0.5ML Pen Inject 15 mg into the skin once a week.   [DISCONTINUED] tirzepatide  (MOUNJARO ) 15 MG/0.5ML Pen Inject  15 mg into the skin once a week.   No facility-administered encounter medications on file as of 04/01/2024.     Follow-Up   Return in about 4 weeks (around 04/29/2024) for For Weight Mangement with Dr. Francyne.SABRA She was informed of the importance of frequent follow up visits to maximize her success with intensive lifestyle modifications for her multiple health conditions.  Attestation Statement   Reviewed by clinician on day of visit: allergies, medications, problem list, medical history, surgical history, family history, social history, and previous encounter notes.     Lucas Francyne, MD

## 2024-04-15 DIAGNOSIS — S83412A Sprain of medial collateral ligament of left knee, initial encounter: Secondary | ICD-10-CM | POA: Diagnosis not present

## 2024-04-16 DIAGNOSIS — R634 Abnormal weight loss: Secondary | ICD-10-CM | POA: Diagnosis not present

## 2024-04-16 DIAGNOSIS — E559 Vitamin D deficiency, unspecified: Secondary | ICD-10-CM | POA: Diagnosis not present

## 2024-04-16 DIAGNOSIS — E7800 Pure hypercholesterolemia, unspecified: Secondary | ICD-10-CM | POA: Diagnosis not present

## 2024-04-23 ENCOUNTER — Ambulatory Visit

## 2024-04-27 ENCOUNTER — Inpatient Hospital Stay
Admission: RE | Admit: 2024-04-27 | Discharge: 2024-04-27 | Attending: Physician Assistant | Admitting: Physician Assistant

## 2024-04-27 DIAGNOSIS — Z1231 Encounter for screening mammogram for malignant neoplasm of breast: Secondary | ICD-10-CM | POA: Diagnosis not present

## 2024-04-28 ENCOUNTER — Ambulatory Visit (INDEPENDENT_AMBULATORY_CARE_PROVIDER_SITE_OTHER): Admitting: Internal Medicine

## 2024-04-28 ENCOUNTER — Encounter (INDEPENDENT_AMBULATORY_CARE_PROVIDER_SITE_OTHER): Payer: Self-pay | Admitting: Internal Medicine

## 2024-04-28 VITALS — BP 117/74 | HR 69 | Temp 98.0°F | Ht 66.0 in | Wt 256.0 lb

## 2024-04-28 DIAGNOSIS — Z7984 Long term (current) use of oral hypoglycemic drugs: Secondary | ICD-10-CM | POA: Diagnosis not present

## 2024-04-28 DIAGNOSIS — Z6841 Body Mass Index (BMI) 40.0 and over, adult: Secondary | ICD-10-CM

## 2024-04-28 DIAGNOSIS — R638 Other symptoms and signs concerning food and fluid intake: Secondary | ICD-10-CM | POA: Diagnosis not present

## 2024-04-28 DIAGNOSIS — E66813 Obesity, class 3: Secondary | ICD-10-CM

## 2024-04-28 DIAGNOSIS — G4733 Obstructive sleep apnea (adult) (pediatric): Secondary | ICD-10-CM

## 2024-04-28 DIAGNOSIS — Z7985 Long-term (current) use of injectable non-insulin antidiabetic drugs: Secondary | ICD-10-CM

## 2024-04-28 DIAGNOSIS — E1169 Type 2 diabetes mellitus with other specified complication: Secondary | ICD-10-CM | POA: Diagnosis not present

## 2024-04-28 DIAGNOSIS — M25512 Pain in left shoulder: Secondary | ICD-10-CM | POA: Diagnosis not present

## 2024-04-28 DIAGNOSIS — M5412 Radiculopathy, cervical region: Secondary | ICD-10-CM | POA: Diagnosis not present

## 2024-04-28 NOTE — Assessment & Plan Note (Signed)
 She has gained four pounds since the last visit, attributed to decreased physical activity and increased caloric intake during social events and holidays. Currently adheres to a 1500 calorie nutrition plan 50% of the time and exercises one to two days a week for 90 minutes. Emphasized the importance of avoiding habitual indulgences to prevent changes in gut microbiome and increased cravings. She plans to resume her exercise routine and be mindful of her diet. Indirect calorimetry will be repeated in January to assess current metabolic rate and adjust the nutrition plan if necessary. - Repeat indirect calorimetry in January to assess metabolic rate. - Encourage resumption of regular aquatic exercise. - Discuss potential reduction of artificial sweeteners and soft drinks, suggesting alternatives like green or black tea with honey.

## 2024-04-28 NOTE — Assessment & Plan Note (Signed)
 Reviewed labs from [facility].  Type 2 diabetes is in remission with an A1c of 5.5. Discussion on the potential impact of artificial sweeteners on glucose metabolism and the importance of maintaining lifestyle changes to sustain remission. - Continue current lifestyle modifications to maintain diabetes remission. - Monitor A1c and glucose levels regularly. -Continue Mounjaro , metformin and SGLT2.

## 2024-04-28 NOTE — Assessment & Plan Note (Signed)
 On CPAP with reported good compliance. Continue PAP therapy.  Consider repeating sleep study as she has lost approximately 24% of total body weight therefore possibly reducing AHI.  Continue current weight management strategy inclusive of GLP-1

## 2024-04-28 NOTE — Progress Notes (Signed)
 Office: [phone]  /  Fax: [phone]  Weight Summary and Body Composition Analysis (BIA)  Vitals Temp: 98 F (36.7 C) BP: 117/74 Pulse Rate: 69 SpO2: 98 %   Anthropometric Measurements Height: 5' 6 (1.676 m) Weight: 256 lb (116.1 kg) BMI (Calculated): 41.34 Weight at Last Visit: 252lb Weight Lost Since Last Visit: 0lb Weight Gained Since Last Visit: 4lb Starting Weight: 339lb Total Weight Loss (lbs): 83 lb (37.6 kg) Peak Weight: 339lb   Body Composition  Body Fat %: 49.7 % Fat Mass (lbs): 127.4 lbs Muscle Mass (lbs): 122.4 lbs Total Body Water (lbs): 89.2 lbs Visceral Fat Rating : 16    RMR: 2794  Today's Visit #: 29  Starting Date: 02/12/22   Subjective   Chief Complaint: Obesity  Interval History  Discussed the use of AI scribe software for clinical note transcription with the patient, who gave verbal consent to proceed.  History of Present Illness Karen [name] is a 58 year old female with type two diabetes, sleep apnea, and hypertension who presents for medical weight management.  She has gained four pounds since her last visit, attributing this to recent celebrations and decreased physical activity. She follows a 1500 calorie nutrition plan about 50% of the time and exercises one to two days a week for 90 minutes. During recent celebrations, she indulged in foods and activities she typically avoids, such as attending the [employer] and consuming hot chocolates.  She acknowledges the need to maintain her current weight and is mindful of her dietary intake, especially during the upcoming Christmas Day.  She attempted to reduce her intake of artificial sweeteners by cutting down on diet sodas but found it challenging due to lifestyle changes and lack of sleep during recent celebrations. She has a supportive social network, including a college roommate who is also managing her weight, and she holds each other accountable for healthy choices.      Challenges affecting patient progress: exposure to enticing environments and/or relationships and celebratory eating.    Pharmacotherapy for weight management: She is currently taking Metformin with diabetes as primary indication with adequate clinical response  and without side effects. and Monjauro with diabetes as the primary indication and obesity secondary with adequate clinical response  and without side effects..   Assessment and Plan   Treatment Plan For Obesity:  Recommended Dietary Goals  [name] is currently in the action stage of change. As such, her goal is to continue weight management plan. She has agreed to: follow the Category 3 plan - 1500 kcal per day, incorporate prepackaged healthy meals for convenience, and incorporate 1-2 meal replacements a day for convenience   Behavioral Health and Counseling  We discussed the following behavioral modification strategies today: avoiding temptations and identifying enticing environmental cues, continue to work on maintaining a reduced calorie state, getting the recommended amount of protein, incorporating whole foods, making healthy choices, staying well hydrated and practicing mindfulness when eating., and increase protein intake, fibrous foods (25 grams per day for women, 30 grams for men) and water to improve satiety and decrease hunger signals. .  Additional education and resources provided today: Handout on traveling and holiday eating strategies  Recommended Physical Activity Goals  Drue has been advised to work up to 150 minutes of moderate intensity aerobic activity a week and strengthening exercises 2-3 times per week for cardiovascular health, weight loss maintenance and preservation of muscle mass.  She has agreed to :  Resume previous levels of physical activity  Medical Interventions and Pharmacotherapy  We discussed various medication options to help Icess with her weight loss efforts and we both agreed to :  Adequate clinical response to anti-obesity medication, continue current regimen and Patient was counseled on the importance of maintaining healthy lifestyle habits, including balanced nutrition, regular physical activity, and behavioral modifications, while taking antiobesity medication.  Patient verbalized understanding that medication is an adjunct to, not a replacement for, lifestyle changes and that the long-term success and weight maintenance depend on continued adherence to these strategies.  Associated Conditions Impacted by Obesity Treatment  Assessment & Plan OSA on CPAP On CPAP with reported good compliance. Continue PAP therapy.  Consider repeating sleep study as she has lost approximately 24% of total body weight therefore possibly reducing AHI.  Continue current weight management strategy inclusive of GLP-1  Type 2 diabetes mellitus with other specified complication, without long-term current use of insulin  (HCC) Reviewed labs from [facility].  Type 2 diabetes is in remission with an A1c of 5.5. Discussion on the potential impact of artificial sweeteners on glucose metabolism and the importance of maintaining lifestyle changes to sustain remission. - Continue current lifestyle modifications to maintain diabetes remission. - Monitor A1c and glucose levels regularly. -Continue Mounjaro , metformin and SGLT2.  Abnormal food appetite Class 3 severe obesity with serious comorbidity and body mass index (BMI) of 40.0 to 44.9 in adult, unspecified obesity type (HCC) She has gained four pounds since the last visit, attributed to decreased physical activity and increased caloric intake during social events and holidays. Currently adheres to a 1500 calorie nutrition plan 50% of the time and exercises one to two days a week for 90 minutes. Emphasized the importance of avoiding habitual indulgences to prevent changes in gut microbiome and increased cravings. She plans to resume her exercise routine and be  mindful of her diet. Indirect calorimetry will be repeated in January to assess current metabolic rate and adjust the nutrition plan if necessary. - Repeat indirect calorimetry in January to assess metabolic rate. - Encourage resumption of regular aquatic exercise. - Discuss potential reduction of artificial sweeteners and soft drinks, suggesting alternatives like green or black tea with honey.          Objective   Physical Exam:  Blood pressure 117/74, pulse 69, temperature 98 F (36.7 C), height 5' 6 (1.676 m), weight 256 lb (116.1 kg), last menstrual period 07/16/2016, SpO2 98%. Body mass index is 41.32 kg/m.  General: She is overweight, cooperative, alert, well developed, and in no acute distress. PSYCH: Has normal mood, affect and thought process.   HEENT: EOMI, sclerae are anicteric. Lungs: Normal breathing effort, no conversational dyspnea. Extremities: No edema.  Neurologic: No gross sensory or motor deficits. No tremors or fasciculations noted.    Diagnostic Data Reviewed:  BMET    Component Value Date/Time   NA 140 02/12/2022 1047   K 4.3 02/12/2022 1047   CL 99 02/12/2022 1047   CO2 25 02/12/2022 1047   GLUCOSE 115 (H) 02/12/2022 1047   GLUCOSE 131 (H) 12/02/2017 1619   BUN 18 02/12/2022 1047   CREATININE 0.72 02/12/2022 1047   CALCIUM  9.8 02/12/2022 1047   GFRNONAA 103 04/13/2019 0000   GFRAA 118 04/13/2019 0000   Lab Results  Component Value Date   HGBA1C 6.0 (H) 05/16/2022   HGBA1C 6.3 (H) 04/13/2019   Lab Results  Component Value Date   INSULIN  12.5 02/12/2022   INSULIN  15.1 12/16/2018   Lab Results  Component Value Date  TSH 0.988 02/12/2022   CBC    Component Value Date/Time   WBC 5.9 02/12/2022 1047   WBC 7.9 12/02/2017 1619   RBC 5.06 02/12/2022 1047   RBC 5.57 (H) 12/02/2017 1619   HGB 13.0 02/12/2022 1047   HCT 40.8 02/12/2022 1047   PLT 220 02/12/2022 1047   MCV 81 02/12/2022 1047   MCH 25.7 (L) 02/12/2022 1047   MCH 25.0  (L) 12/02/2017 1619   MCHC 31.9 02/12/2022 1047   MCHC 31.5 12/02/2017 1619   RDW 14.1 02/12/2022 1047   Iron Studies No results found for: IRON, TIBC, FERRITIN, IRONPCTSAT Lipid Panel     Component Value Date/Time   CHOL 149 02/12/2022 1047   TRIG 119 02/12/2022 1047   HDL 52 02/12/2022 1047   CHOLHDL 2.9 02/12/2022 1047   LDLCALC 76 02/12/2022 1047   Hepatic Function Panel     Component Value Date/Time   PROT 6.9 02/12/2022 1047   ALBUMIN 4.4 02/12/2022 1047   AST 17 02/12/2022 1047   ALT 14 02/12/2022 1047   ALKPHOS 83 02/12/2022 1047   BILITOT 0.3 02/12/2022 1047      Component Value Date/Time   TSH 0.988 02/12/2022 1047   Nutritional Lab Results  Component Value Date   VD25OH 35.6 10/21/2022   VD25OH 28.4 (L) 07/08/2022   VD25OH 16.3 (L) 02/12/2022    Medications: Outpatient Encounter Medications as of 04/28/2024  Medication Sig   CLOMIPRAMINE HCL PO Take 100 mg by mouth daily. 100 mg daily   empagliflozin  (JARDIANCE ) 10 MG TABS tablet Take 1 tablet (10 mg total) by mouth daily before breakfast.   fluvoxaMINE (LUVOX) 50 MG tablet Take 50 mg by mouth at bedtime. (Patient taking differently: Take 100 mg by mouth at bedtime.)   hydrOXYzine HCl (ATARAX PO) Take 25 mg by mouth at bedtime. Unknown dosage   metFORMIN (GLUCOPHAGE) 1000 MG tablet Take 1,000 mg by mouth 2 (two) times daily with a meal.   Multiple Vitamins-Minerals (MULTIVITAMIN) tablet Take 1 tablet by mouth daily.   pantoprazole (PROTONIX) 40 MG tablet Take 40 mg by mouth daily.   Prucalopride Succinate (MOTEGRITY PO) Take by mouth daily at 12 noon. Dosage unknow   rosuvastatin  (CRESTOR ) 10 MG tablet Take 1 tablet (10 mg total) by mouth daily.   tirzepatide  (MOUNJARO ) 15 MG/0.5ML Pen Inject 15 mg into the skin once a week.   VITAMIN D , CHOLECALCIFEROL, PO Take by mouth. OTC dosage for unknown   No facility-administered encounter medications on file as of 04/28/2024.     Follow-Up   Return  in about 4 weeks (around 05/26/2024) for Fasting and 30 minutes early for IC.SABRA She was informed of the importance of frequent follow up visits to maximize her success with intensive lifestyle modifications for her multiple health conditions.  Attestation Statement   Reviewed by clinician on day of visit: allergies, medications, problem list, medical history, surgical history, family history, social history, and previous encounter notes.     Lucas Parker, MD

## 2024-06-03 ENCOUNTER — Encounter (INDEPENDENT_AMBULATORY_CARE_PROVIDER_SITE_OTHER): Payer: Self-pay | Admitting: Internal Medicine

## 2024-06-03 ENCOUNTER — Ambulatory Visit (INDEPENDENT_AMBULATORY_CARE_PROVIDER_SITE_OTHER): Admitting: Internal Medicine

## 2024-06-03 VITALS — BP 90/61 | HR 67 | Temp 97.7°F | Ht 66.0 in | Wt 252.0 lb

## 2024-06-03 DIAGNOSIS — R638 Other symptoms and signs concerning food and fluid intake: Secondary | ICD-10-CM | POA: Diagnosis not present

## 2024-06-03 DIAGNOSIS — Z6841 Body Mass Index (BMI) 40.0 and over, adult: Secondary | ICD-10-CM

## 2024-06-03 DIAGNOSIS — E119 Type 2 diabetes mellitus without complications: Secondary | ICD-10-CM

## 2024-06-03 DIAGNOSIS — E66813 Obesity, class 3: Secondary | ICD-10-CM

## 2024-06-03 DIAGNOSIS — E785 Hyperlipidemia, unspecified: Secondary | ICD-10-CM | POA: Diagnosis not present

## 2024-06-03 DIAGNOSIS — Z7984 Long term (current) use of oral hypoglycemic drugs: Secondary | ICD-10-CM | POA: Diagnosis not present

## 2024-06-03 DIAGNOSIS — E1169 Type 2 diabetes mellitus with other specified complication: Secondary | ICD-10-CM

## 2024-06-03 DIAGNOSIS — Z7985 Long-term (current) use of injectable non-insulin antidiabetic drugs: Secondary | ICD-10-CM | POA: Diagnosis not present

## 2024-06-03 NOTE — Assessment & Plan Note (Addendum)
 Weight: decrease of 89 lb (26.1%) over 2 years, 2 months  Start: 03/18/2022 341 lb (154.7 kg) (H)  End: 06/03/2024 252 lb (114.3 kg)  Because of a slowdown in her weight loss we completed indirect calorimetry today that showed a resting energy expenditure of 2174 cal in comparison to her basal metabolic rate of 8167 this shows that her metabolic rate has improved and she is not experiencing a metabolic slowdown that is sometimes seen with her degree of weight loss.  She has lost 4 pounds since the last visit, following a 1500 calorie nutrition plan 60% of the time and exercising one day a week for 90 minutes, mostly cardio. Her metabolism is at 2100, with no metabolic slowdown. She has lost 89 pounds, a 26.1% reduction, with no plateauing in weight loss. Gabapentin may cause fluid retention and weight gain, and steroid injections could temporarily affect weight. Mounjaro  is effective in reducing food noise and aiding weight management. - Continue 1500 calorie nutrition plan. - Increase exercise frequency, including aqua aerobics and home workouts. - Monitor for side effects of gabapentin, including fluid retention and weight gain. - Consider steroid injections if recommended by the specialist. - Continue Mounjaro  for weight management.

## 2024-06-03 NOTE — Progress Notes (Addendum)
 "  Office: [phone]  /  Fax: [phone]  Weight Summary and Body Composition Analysis (BIA)  Vitals Temp: 97.7 F (36.5 C) BP: 90/61 Pulse Rate: 67 SpO2: 98 %   Anthropometric Measurements Height: 5' 6 (1.676 m) Weight: 252 lb (114.3 kg) BMI (Calculated): 40.69 Weight at Last Visit: 256 lb Weight Lost Since Last Visit: 4 lb Weight Gained Since Last Visit: 0 lb Starting Weight: 339 lb Total Weight Loss (lbs): 87 lb (39.5 kg) Peak Weight: 339 lb   Body Composition  Body Fat %: 48.8 % Fat Mass (lbs): 123 lbs Muscle Mass (lbs): 122.4 lbs Total Body Water (lbs): 87.6 lbs Visceral Fat Rating : 15    RMR: 2174  Today's Visit #: 30  Starting Date: 02/12/22   Subjective   Chief Complaint: Obesity  Interval History  Discussed the use of AI scribe software for clinical note transcription with the patient, who gave verbal consent to proceed.  History of Present Illness Lee-Ann Gal is a 59 year old female who presents for medical weight management.  She has lost four pounds since her last visit and follows a 1500 calorie nutrition plan about 60% of the time. She exercises once a week for 90 minutes, focusing on cardio. She feels satisfied after meals and does not typically snack, though she occasionally feels hungry at inconsistent times, such as late at night.  She is currently taking Jardiance  and Mounjaro  for weight management and type 2 diabetes. Her A1c is 5.5, and her LDL cholesterol is 59. She experiences no symptoms of lightheadedness or dizziness despite a blood pressure of 90/61. She ensures adequate hydration, primarily drinking unsweetened tea with lemon.  She has recently started gabapentin 100 mg three times a day for a pinched nerve in her rotator cuff. She plans to see a doctor in early February and start physical therapy.  Her mother was diagnosed with Lewy body dementia six months ago, and she frequently drives an hour and a half to visit  her. Her mother experiences severe hallucinations, which has been emotionally challenging for her.  She is also on fluvoxamine and clomipramine, which can stimulate appetite. Mounjaro  has been effective in reducing 'food noise' and helping her manage her eating habits.  She plans to focus on increasing her exercise, particularly aqua aerobics, and has received pink wrist weights from her children to aid in her exercise routine.     Challenges affecting patient progress: none.    Pharmacotherapy for weight management: She is currently taking Monjauro with diabetes as the primary indication and obesity secondary with adequate clinical response  and without side effects..   Assessment and Plan   Treatment Plan For Obesity:  Recommended Dietary Goals  Alaiah is currently in the action stage of change. As such, her goal is to continue weight management plan. She has agreed to: follow a balanced (30%/40%/30%), whole foods-based, reduced-calorie meal plan (RCNP) targeting 1500 kcal per day, incorporate prepackaged healthy meals for convenience, and incorporate 1-2 meal replacements a day for convenience   Behavioral Health and Counseling  We discussed the following behavioral modification strategies today: increasing lean protein intake to established goals, decreasing simple carbohydrates , increasing vegetables, increasing lower glycemic fruits, increasing fiber rich foods, avoiding skipping meals, and increasing water intake .  Additional education and resources provided today: None  Recommended Physical Activity Goals  Chrystie has been advised to work up to 150 minutes of moderate intensity aerobic activity a week and strengthening exercises 2-3 times per  week for cardiovascular health, weight loss maintenance and preservation of muscle mass.  She has agreed to :  Continue to gradually increase the amount and intensity of exercise routine  Medical Interventions and Pharmacotherapy  We  discussed various medication options to help Tanisha with her weight loss efforts and we both agreed to : Adequate clinical response to anti-obesity medication, continue current anti-obesity regimen and Patient was counseled on the importance of maintaining healthy lifestyle habits, including balanced nutrition, regular physical activity, and behavioral modifications, while taking antiobesity medication.  Patient verbalized understanding that medication is an adjunct to, not a replacement for, lifestyle changes and that the long-term success and weight maintenance depend on continued adherence to these strategies.  Associated Conditions Impacted by Obesity Treatment  Assessment & Plan Class 3 severe obesity with serious comorbidity and body mass index (BMI) of 40.0 to 44.9 in adult, unspecified obesity type (HCC) Abnormal food appetite Weight: decrease of 89 lb (26.1%) over 2 years, 2 months  Start: 03/18/2022 341 lb (154.7 kg) (H)  End: 06/03/2024 252 lb (114.3 kg)  Because of a slowdown in her weight loss we completed indirect calorimetry today that showed a resting energy expenditure of 2174 cal in comparison to her basal metabolic rate of 8167 this shows that her metabolic rate has improved and she is not experiencing a metabolic slowdown that is sometimes seen with her degree of weight loss.  She has lost 4 pounds since the last visit, following a 1500 calorie nutrition plan 60% of the time and exercising one day a week for 90 minutes, mostly cardio. Her metabolism is at 2100, with no metabolic slowdown. She has lost 89 pounds, a 26.1% reduction, with no plateauing in weight loss. Gabapentin may cause fluid retention and weight gain, and steroid injections could temporarily affect weight. Mounjaro  is effective in reducing food noise and aiding weight management. - Continue 1500 calorie nutrition plan. - Increase exercise frequency, including aqua aerobics and home workouts. - Monitor for side effects of  gabapentin, including fluid retention and weight gain. - Consider steroid injections if recommended by the specialist. - Continue Mounjaro  for weight management.  Type 2 diabetes mellitus without complication, without long-term current use of insulin  (HCC) Her A1c is 5.5, indicating good glycemic control. She is on Jardiance  and Mounjaro , which have contributed to weight loss and improved blood sugar control. Jardiance , a diuretic, may affect blood pressure, but she is not experiencing lightheadedness or dizziness. Adequate hydration is emphasized to prevent hypotension. Mounjaro  is effective in reducing food noise and aiding weight management. - Continue Jardiance  and Mounjaro  for diabetes management. - Ensure adequate hydration to prevent hypotension. - Monitor blood pressure regularly. - Consider stopping Jardiance  if lightheadedness or dizziness occurs. Hyperlipidemia associated with type 2 diabetes mellitus (HCC) Most recent LDL cholesterol was 59 in Care Everywhere.  She is on rosuvastatin  10 mg once a day without any adverse effects.  Continue current weight management strategy        Objective   Physical Exam:  Blood pressure 90/61, pulse 67, temperature 97.7 F (36.5 C), height 5' 6 (1.676 m), weight 252 lb (114.3 kg), last menstrual period 07/16/2016, SpO2 98%. Body mass index is 40.67 kg/m.  General: She is overweight, cooperative, alert, well developed, and in no acute distress. PSYCH: Has normal mood, affect and thought process.   HEENT: EOMI, sclerae are anicteric. Lungs: Normal breathing effort, no conversational dyspnea. Extremities: No edema.  Neurologic: No gross sensory or motor deficits. No tremors or  fasciculations noted.    Diagnostic Data Reviewed:  BMET    Component Value Date/Time   NA 140 02/12/2022 1047   K 4.3 02/12/2022 1047   CL 99 02/12/2022 1047   CO2 25 02/12/2022 1047   GLUCOSE 115 (H) 02/12/2022 1047   GLUCOSE 131 (H) 12/02/2017 1619    BUN 18 02/12/2022 1047   CREATININE 0.72 02/12/2022 1047   CALCIUM  9.8 02/12/2022 1047   GFRNONAA 103 04/13/2019 0000   GFRAA 118 04/13/2019 0000   Lab Results  Component Value Date   HGBA1C 6.0 (H) 05/16/2022   HGBA1C 6.3 (H) 04/13/2019   Lab Results  Component Value Date   INSULIN  12.5 02/12/2022   INSULIN  15.1 12/16/2018   Lab Results  Component Value Date   TSH 0.988 02/12/2022   CBC    Component Value Date/Time   WBC 5.9 02/12/2022 1047   WBC 7.9 12/02/2017 1619   RBC 5.06 02/12/2022 1047   RBC 5.57 (H) 12/02/2017 1619   HGB 13.0 02/12/2022 1047   HCT 40.8 02/12/2022 1047   PLT 220 02/12/2022 1047   MCV 81 02/12/2022 1047   MCH 25.7 (L) 02/12/2022 1047   MCH 25.0 (L) 12/02/2017 1619   MCHC 31.9 02/12/2022 1047   MCHC 31.5 12/02/2017 1619   RDW 14.1 02/12/2022 1047   Iron Studies No results found for: IRON, TIBC, FERRITIN, IRONPCTSAT Lipid Panel     Component Value Date/Time   CHOL 149 02/12/2022 1047   TRIG 119 02/12/2022 1047   HDL 52 02/12/2022 1047   CHOLHDL 2.9 02/12/2022 1047   LDLCALC 76 02/12/2022 1047   Hepatic Function Panel     Component Value Date/Time   PROT 6.9 02/12/2022 1047   ALBUMIN 4.4 02/12/2022 1047   AST 17 02/12/2022 1047   ALT 14 02/12/2022 1047   ALKPHOS 83 02/12/2022 1047   BILITOT 0.3 02/12/2022 1047      Component Value Date/Time   TSH 0.988 02/12/2022 1047   Nutritional Lab Results  Component Value Date   VD25OH 35.6 10/21/2022   VD25OH 28.4 (L) 07/08/2022   VD25OH 16.3 (L) 02/12/2022    Medications: Outpatient Encounter Medications as of 06/03/2024  Medication Sig   CLOMIPRAMINE HCL PO Take 100 mg by mouth daily. 100 mg daily   empagliflozin  (JARDIANCE ) 10 MG TABS tablet Take 1 tablet (10 mg total) by mouth daily before breakfast.   fluvoxaMINE (LUVOX) 50 MG tablet Take 50 mg by mouth at bedtime. (Patient taking differently: Take 100 mg by mouth at bedtime.)   hydrOXYzine HCl (ATARAX PO) Take 25 mg by  mouth at bedtime. Unknown dosage   metFORMIN (GLUCOPHAGE) 1000 MG tablet Take 1,000 mg by mouth 2 (two) times daily with a meal.   Multiple Vitamins-Minerals (MULTIVITAMIN) tablet Take 1 tablet by mouth daily.   pantoprazole (PROTONIX) 40 MG tablet Take 40 mg by mouth daily.   Prucalopride Succinate (MOTEGRITY PO) Take by mouth daily at 12 noon. Dosage unknow   rosuvastatin  (CRESTOR ) 10 MG tablet Take 1 tablet (10 mg total) by mouth daily.   tirzepatide  (MOUNJARO ) 15 MG/0.5ML Pen Inject 15 mg into the skin once a week.   VITAMIN D , CHOLECALCIFEROL, PO Take by mouth. OTC dosage for unknown   No facility-administered encounter medications on file as of 06/03/2024.     Follow-Up   No follow-ups on file.SABRA She was informed of the importance of frequent follow up visits to maximize her success with intensive lifestyle modifications for her multiple health  conditions.  Attestation Statement   Reviewed by clinician on day of visit: allergies, medications, problem list, medical history, surgical history, family history, social history, and previous encounter notes.     Lucas Parker, MD  "

## 2024-06-03 NOTE — Assessment & Plan Note (Signed)
 Most recent LDL cholesterol was 59 in Care Everywhere.  She is on rosuvastatin  10 mg once a day without any adverse effects.  Continue current weight management strategy

## 2024-06-03 NOTE — Assessment & Plan Note (Signed)
 Her A1c is 5.5, indicating good glycemic control. She is on Jardiance  and Mounjaro , which have contributed to weight loss and improved blood sugar control. Jardiance , a diuretic, may affect blood pressure, but she is not experiencing lightheadedness or dizziness. Adequate hydration is emphasized to prevent hypotension. Mounjaro  is effective in reducing food noise and aiding weight management. - Continue Jardiance  and Mounjaro  for diabetes management. - Ensure adequate hydration to prevent hypotension. - Monitor blood pressure regularly. - Consider stopping Jardiance  if lightheadedness or dizziness occurs.

## 2024-06-30 ENCOUNTER — Ambulatory Visit (INDEPENDENT_AMBULATORY_CARE_PROVIDER_SITE_OTHER): Admitting: Internal Medicine
